# Patient Record
Sex: Male | Born: 1940 | Race: White | Hispanic: No | Marital: Married | Smoking: Former smoker
Health system: Southern US, Community
[De-identification: ages and names within clinical notes are randomized; demographics above are authoritative.]

## PROBLEM LIST (undated history)

## (undated) DIAGNOSIS — C719 Malignant neoplasm of brain, unspecified: Principal | ICD-10-CM

## (undated) DIAGNOSIS — E119 Type 2 diabetes mellitus without complications: Secondary | ICD-10-CM

## (undated) HISTORY — DX: Malignant neoplasm of brain, unspecified: C71.9

## (undated) HISTORY — PX: CHOLECYSTECTOMY: SHX55

## (undated) HISTORY — DX: Type 2 diabetes mellitus without complications: E11.9

---

## 2002-02-02 DIAGNOSIS — E7800 Pure hypercholesterolemia, unspecified: Secondary | ICD-10-CM | POA: Insufficient documentation

## 2006-06-19 DIAGNOSIS — N200 Calculus of kidney: Secondary | ICD-10-CM | POA: Insufficient documentation

## 2009-03-18 ENCOUNTER — Ambulatory Visit: Payer: Self-pay | Admitting: Cardiology

## 2009-04-04 DIAGNOSIS — I6782 Cerebral ischemia: Secondary | ICD-10-CM | POA: Insufficient documentation

## 2010-11-14 DIAGNOSIS — I517 Cardiomegaly: Secondary | ICD-10-CM | POA: Insufficient documentation

## 2015-08-24 DIAGNOSIS — I779 Disorder of arteries and arterioles, unspecified: Secondary | ICD-10-CM | POA: Insufficient documentation

## 2015-08-24 DIAGNOSIS — E119 Type 2 diabetes mellitus without complications: Secondary | ICD-10-CM | POA: Insufficient documentation

## 2015-08-24 DIAGNOSIS — I739 Peripheral vascular disease, unspecified: Secondary | ICD-10-CM

## 2015-08-27 DIAGNOSIS — R7301 Impaired fasting glucose: Secondary | ICD-10-CM | POA: Insufficient documentation

## 2015-09-14 DIAGNOSIS — B353 Tinea pedis: Secondary | ICD-10-CM | POA: Insufficient documentation

## 2015-09-14 DIAGNOSIS — B351 Tinea unguium: Secondary | ICD-10-CM | POA: Insufficient documentation

## 2015-10-19 DIAGNOSIS — I619 Nontraumatic intracerebral hemorrhage, unspecified: Secondary | ICD-10-CM | POA: Insufficient documentation

## 2015-10-19 DIAGNOSIS — N400 Enlarged prostate without lower urinary tract symptoms: Secondary | ICD-10-CM | POA: Insufficient documentation

## 2015-10-19 DIAGNOSIS — I10 Essential (primary) hypertension: Secondary | ICD-10-CM | POA: Insufficient documentation

## 2015-10-19 DIAGNOSIS — K219 Gastro-esophageal reflux disease without esophagitis: Secondary | ICD-10-CM | POA: Insufficient documentation

## 2015-10-19 DIAGNOSIS — D333 Benign neoplasm of cranial nerves: Secondary | ICD-10-CM | POA: Insufficient documentation

## 2015-10-19 DIAGNOSIS — E785 Hyperlipidemia, unspecified: Secondary | ICD-10-CM | POA: Insufficient documentation

## 2015-10-22 DIAGNOSIS — E119 Type 2 diabetes mellitus without complications: Secondary | ICD-10-CM | POA: Insufficient documentation

## 2015-10-27 DIAGNOSIS — C719 Malignant neoplasm of brain, unspecified: Secondary | ICD-10-CM | POA: Insufficient documentation

## 2015-10-27 HISTORY — DX: Malignant neoplasm of brain, unspecified: C71.9

## 2015-10-29 DIAGNOSIS — E559 Vitamin D deficiency, unspecified: Secondary | ICD-10-CM | POA: Insufficient documentation

## 2015-11-21 DIAGNOSIS — B370 Candidal stomatitis: Secondary | ICD-10-CM | POA: Insufficient documentation

## 2015-11-22 DIAGNOSIS — T380X5A Adverse effect of glucocorticoids and synthetic analogues, initial encounter: Secondary | ICD-10-CM | POA: Insufficient documentation

## 2015-11-22 DIAGNOSIS — G720 Drug-induced myopathy: Secondary | ICD-10-CM | POA: Insufficient documentation

## 2016-02-13 DIAGNOSIS — E162 Hypoglycemia, unspecified: Secondary | ICD-10-CM | POA: Insufficient documentation

## 2016-02-20 DIAGNOSIS — F19939 Other psychoactive substance use, unspecified with withdrawal, unspecified: Secondary | ICD-10-CM | POA: Insufficient documentation

## 2016-02-20 DIAGNOSIS — F19239 Other psychoactive substance dependence with withdrawal, unspecified: Secondary | ICD-10-CM | POA: Insufficient documentation

## 2016-03-19 DIAGNOSIS — D696 Thrombocytopenia, unspecified: Secondary | ICD-10-CM | POA: Insufficient documentation

## 2016-03-20 ENCOUNTER — Other Ambulatory Visit (HOSPITAL_COMMUNITY): Payer: Self-pay | Admitting: Oncology

## 2016-03-20 DIAGNOSIS — C719 Malignant neoplasm of brain, unspecified: Secondary | ICD-10-CM

## 2016-04-02 ENCOUNTER — Encounter (HOSPITAL_COMMUNITY): Payer: Medicare Other | Attending: Hematology | Admitting: Hematology

## 2016-04-02 ENCOUNTER — Encounter (HOSPITAL_COMMUNITY): Payer: Medicare Other

## 2016-04-02 ENCOUNTER — Encounter (HOSPITAL_COMMUNITY): Payer: Self-pay

## 2016-04-02 VITALS — BP 121/73 | HR 100 | Temp 98.1°F | Ht 67.0 in | Wt 179.1 lb

## 2016-04-02 DIAGNOSIS — Z87891 Personal history of nicotine dependence: Secondary | ICD-10-CM | POA: Diagnosis not present

## 2016-04-02 DIAGNOSIS — C719 Malignant neoplasm of brain, unspecified: Secondary | ICD-10-CM

## 2016-04-02 DIAGNOSIS — C713 Malignant neoplasm of parietal lobe: Secondary | ICD-10-CM | POA: Diagnosis present

## 2016-04-02 DIAGNOSIS — I10 Essential (primary) hypertension: Secondary | ICD-10-CM | POA: Diagnosis not present

## 2016-04-02 DIAGNOSIS — Z7984 Long term (current) use of oral hypoglycemic drugs: Secondary | ICD-10-CM | POA: Diagnosis not present

## 2016-04-02 DIAGNOSIS — D696 Thrombocytopenia, unspecified: Secondary | ICD-10-CM

## 2016-04-02 DIAGNOSIS — Z794 Long term (current) use of insulin: Secondary | ICD-10-CM | POA: Diagnosis not present

## 2016-04-02 DIAGNOSIS — Z79899 Other long term (current) drug therapy: Secondary | ICD-10-CM | POA: Diagnosis not present

## 2016-04-02 DIAGNOSIS — E139 Other specified diabetes mellitus without complications: Secondary | ICD-10-CM

## 2016-04-02 DIAGNOSIS — E1169 Type 2 diabetes mellitus with other specified complication: Secondary | ICD-10-CM | POA: Insufficient documentation

## 2016-04-02 DIAGNOSIS — E099 Drug or chemical induced diabetes mellitus without complications: Secondary | ICD-10-CM | POA: Diagnosis not present

## 2016-04-02 DIAGNOSIS — E785 Hyperlipidemia, unspecified: Secondary | ICD-10-CM | POA: Diagnosis not present

## 2016-04-02 DIAGNOSIS — M869 Osteomyelitis, unspecified: Secondary | ICD-10-CM

## 2016-04-02 LAB — CBC WITH DIFFERENTIAL/PLATELET
BASOS ABS: 0 10*3/uL (ref 0.0–0.1)
Basophils Relative: 0 %
Eosinophils Absolute: 0.1 10*3/uL (ref 0.0–0.7)
Eosinophils Relative: 1 %
HCT: 41.8 % (ref 39.0–52.0)
HEMOGLOBIN: 14.5 g/dL (ref 13.0–17.0)
LYMPHS ABS: 0.6 10*3/uL — AB (ref 0.7–4.0)
LYMPHS PCT: 9 %
MCH: 34.5 pg — ABNORMAL HIGH (ref 26.0–34.0)
MCHC: 34.7 g/dL (ref 30.0–36.0)
MCV: 99.5 fL (ref 78.0–100.0)
MONOS PCT: 4 %
Monocytes Absolute: 0.2 10*3/uL (ref 0.1–1.0)
NEUTROS PCT: 86 %
Neutro Abs: 5.3 10*3/uL (ref 1.7–7.7)
PLATELETS: 86 10*3/uL — AB (ref 150–400)
RBC: 4.2 MIL/uL — AB (ref 4.22–5.81)
RDW: 13.9 % (ref 11.5–15.5)
WBC: 6.2 10*3/uL (ref 4.0–10.5)

## 2016-04-02 LAB — COMPREHENSIVE METABOLIC PANEL
ALK PHOS: 40 U/L (ref 38–126)
ALT: 26 U/L (ref 17–63)
AST: 22 U/L (ref 15–41)
Albumin: 3.6 g/dL (ref 3.5–5.0)
Anion gap: 11 (ref 5–15)
BUN: 18 mg/dL (ref 6–20)
CHLORIDE: 99 mmol/L — AB (ref 101–111)
CO2: 25 mmol/L (ref 22–32)
CREATININE: 0.87 mg/dL (ref 0.61–1.24)
Calcium: 8.9 mg/dL (ref 8.9–10.3)
GFR calc Af Amer: >60 mL/min (ref >60)
Glucose, Bld: 316 mg/dL — ABNORMAL HIGH (ref 65–99)
Potassium: 4.9 mmol/L (ref 3.5–5.1)
Sodium: 135 mmol/L (ref 135–145)
Total Bilirubin: 0.7 mg/dL (ref 0.3–1.2)
Total Protein: 5.8 g/dL — ABNORMAL LOW (ref 6.5–8.1)

## 2016-04-02 MED ORDER — DEXAMETHASONE 4 MG PO TABS
ORAL_TABLET | ORAL | Status: DC
Start: 2016-04-02 — End: 2016-07-19

## 2016-04-02 NOTE — Progress Notes (Signed)
Marland Kitchen    HEMATOLOGY/ONCOLOGY CONSULTATION NOTE  Date of Service: 04/02/2016  Patient Care Team: [name], MD as PCP - General (Internal Medicine)  CHIEF COMPLAINTS/PURPOSE OF CONSULTATION:   Continued treatment of GBM.  HISTORY OF PRESENTING ILLNESS:  Phillip Mckay is a wonderful 75 y.o. male who has been referred to [place] by Dr Neijstrom/Dr Sherrie Sport for evaluation and management of GBM.  He is 75 year old gentleman who presented with  trouble utilizing his left hand as well as parking his vehicle within the designated parking space was the end of December 2016.   He was evaluated and an MRI of the brain on 10/19/2015 which showed a 4.4 x 3.8 x 4.6 cm peripheral enhancing mass centered within the right parietal lobe consistent with a primary glial neoplasm with edema and some mass effect and some associated hemorrhage. He also had a 3 mm midline shift.  Patient subsequently had a subtotal resection of about 80% of the tumor mass by Dr. Owens Shark on 10/25/2015 at [facility].  He was apparently discharged on dexamethasone on 4 mg 4 times a day as well as Keppra 750 mg twice a day.  It was uncertain if he had a seizure prior to his surgery ordered the Keppra was started for seizure prophylaxis.  He was subsequently started on concurrent radiation therapy plus temozolomide (75 mg meter squared = 140 mg daily while on radiation) chemotherapy. Dr. Ceasar Lund his radiation oncologist. He was on Bactrim DS MWF for PCP/PJP prophylaxis.  Patient was noted to have thrush which was treated during his treatment. 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  He was noted to have thrombocytopenia  which was low enough to delay his start of adjuvant temozolomide chemotherapy after completion of his chemoradiation.  He was started on his first cycle of adjuvant temozolomide at 150 mg/m (280 mg daily from D1 to D5) On 02/21/2016.  He was due for his second cycle on 03/19/2016.  His platelet counts were down to 80k and his temozolomide was held. Dose escalation to 200 mg/m was not done and he was asked to wait to restrict his improvement prior to proceeding with his second cycle of temozolomide.  Tylenol was changed to tramadol.  Patient transferred care to [facility] all repeat labs today show his platelet counts are at 84k He has currently been Dexamethasone 4 mg alternating with 8mg  every other day. We reduced his dose to 4 mg by mouth daily this visit. He has been on ranitidine which we asked him to change since this can cause thrombocytopenia. He was also told to stop vitamin B complex.  He has also been using alternating electrical current treatment  directed by Dr. Lisbeth Renshaw.  No current throat soreness .no fevers or chills. No night sweats. No new focal neurological deficits.  No headaches.        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 In his wife have 2 sons and they adopted one daughter [name] is with him today.  #4 history of hypertension #5 history of dyslipidemia  SURGICAL HISTORY:  10/25/2015 Surgery  Subtotal resection Dr. Owens Shark   SOCIAL HISTORY: Social History   Social History  . Marital Status: Married    Spouse Name: N/A  . Number of Children: N/A  . Years of Education: N/A   Occupational History  . Not on file.   Social History Main Topics  . Smoking status: Not on file  . Smokeless tobacco: Not on file  . Alcohol Use: Not on file  . Drug Use: Not on file  .  Sexual Activity: Not on file   Other Topics Concern  . Not on file   Social History Narrative  . No narrative on file  former smoker quit at age 68 yo Worked as a Pharmacist, community for about 40 years.  FAMILY HISTORY: No family history on file.  ALLERGIES:  has no allergies on file.  MEDICATIONS:  Current Outpatient Prescriptions  Medication Sig Dispense Refill  . B-D ULTRA-FINE 33 LANCETS MISC by Does not apply route.    Mariane Baumgarten Sodium 100 MG capsule Take by mouth.    Marland Kitchen glucose blood (FREESTYLE INSULINX TEST) test strip     . Glycerin-Hypromellose-PEG 400 0.2-0.2-1 % SOLN Apply to eye.    . insulin aspart (NOVOLOG) 100 UNIT/ML injection Inject into the skin.    Marland Kitchen latanoprost (XALATAN) 0.005 % ophthalmic solution Apply to eye.    . levETIRAcetam (KEPPRA) 750 MG tablet Take by mouth.    Marland Kitchen lisinopril (PRINIVIL,ZESTRIL) 40 MG tablet     . ondansetron (ZOFRAN) 8 MG tablet Take by mouth.    . Probiotic Product [facility]) CAPS Take by mouth.    . tamsulosin (FLOMAX) 0.4 MG CAPS capsule Take by mouth.    . tetrahydrozoline 0.05 % ophthalmic solution     . Vitamin D, Ergocalciferol, (DRISDOL) 50000 units CAPS capsule Take by mouth.    . dexamethasone (DECADRON) 4 MG tablet Reduce to 4mg  po daily x 7 days then 4mg  alternating with 2mg  every other day x 10 days then 2mg  po daily till next appointment 60 tablet 0  . metFORMIN (GLUCOPHAGE) 1000 MG tablet Take by mouth.     No current facility-administered medications for this visit.    REVIEW OF SYSTEMS:    10 Point review of Systems was done is negative except as noted above.  PHYSICAL EXAMINATION: ECOG PERFORMANCE STATUS: 2 - Symptomatic, <50% confined to bed  . Filed Vitals:   04/02/16 1500  BP: 121/73  Pulse: 100  Temp: 98.1 F (36.7 C)   Filed Weights   04/02/16 1500  Weight: 179 lb 1.6 oz (81.239 kg)   .Body mass index is 28.04 kg/(m^2).  GENERAL:alert, in no acute distress and comfortable, has wires from the alternating  electrical currents machine on his head SKIN: skin color, texture, turgor are normal, no rashes or significant lesions EYES: normal, conjunctiva are pink and non-injected, sclera clear OROPHARYNX:no exudate, no erythema and lips, buccal mucosa, and tongue normal  NECK: supple, no JVD, thyroid normal size, non-tender, without nodularity LYMPH:  no palpable lymphadenopathy in the cervical, axillary or inguinal LUNGS: clear to auscultation with normal respiratory effort HEART: regular rate & rhythm,  no murmurs and no lower extremity edema ABDOMEN: abdomen soft, non-tender, normoactive bowel sounds  Musculoskeletal: no cyanosis of digits and no clubbing  PSYCH: alert & oriented x 3 with fluent speech NEURO: Moving all 4 extremities, some clumsiness in his left upper extremity  LABORATORY DATA:  I have reviewed the data as listed  . CBC Latest Ref  Rng 04/02/2016  WBC 4.0 - 10.5 K/uL 6.2  Hemoglobin 13.0 - 17.0 g/dL 14.5  Hematocrit 39.0 - 52.0 % 41.8  Platelets 150 - 400 K/uL 86(L)   . CMP Latest Ref Rng 04/02/2016  Glucose 65 - 99 mg/dL 316(H)  BUN 6 - 20 mg/dL 18  Creatinine 0.61 - 1.24 mg/dL 0.87  Sodium 135 - 145 mmol/L 135  Potassium 3.5 - 5.1 mmol/L 4.9  Chloride 101 - 111 mmol/L 99(L)  CO2 22 - 32 mmol/L 25  Calcium 8.9 - 10.3 mg/dL 8.9  Total Protein 6.5 - 8.1 g/dL 5.8(L)  Total Bilirubin 0.3 - 1.2 mg/dL 0.7  Alkaline Phos 38 - 126 U/L 40  AST 15 - 41 U/L 22  ALT 17 - 63 U/L 26    Final Diagnosis  Brain, right parietal, biopsies (specimens 1-2): - Glioblastoma, WHO grade 4; see comment.  Electronically signed by Golden Pop, MD on 10/26/2015 at [place]: I have personally reviewed the radiological images as listed and agreed with the findings in the report. No results found. MRI IMPRESSION: 4.4 x 3.8 x 4.6 CM peripherally enhancing mass centered within the right parietal lobe which is favored to be a primary glial neoplasm. Though this also  may reflect a metastatic lesion, this is considered less likely.  Areas of surrounding susceptibility and layering susceptibility the dependent portion reflective of associated hemorrhage. Surrounding T2/FLAIR hyperintense signal reflective of edema with mass effect and slight effacement of the sulci and right  ventricle and up to 3 mm of midline shift. Areas of diffusion restriction medial to the mass, reflective of possibly related to ischemia versus lesion cellularity.   ASSESSMENT & PLAN:   75 year old pleasant Caucasian gentleman with  #1 Right parietal lobe GBM/WHO grade 4. Unknown methylation status off MGMT. Patient has had partial resection 80% of the tumor on 10/25/2015. Completed concurrent temozolomide and radiation therapy. Received his first cycle of adjuvant temozolomide on 02/21/2016. Patient was due for his second cycle of temozolomide on 03/19/2016 but was postponed due to thrombocytopenia with platelets of 80k Plan -Patient is here for his initial evaluation for continued management of his GBM. -Given his thrombocytopenia his dose for second cycle of temozolomide was maintained at 150mg /m2 -Unfortunately his platelet count is still low at [facility] and therefore his temozolomide not started yet.  - tapering down his dexamethasone. Reduce to 4mg  po daily x 7 days then 4mg  alternating with 2mg  every other day x 10 days then 2mg  po daily till next appointment -We will need to be reevaluated 2 weeks with repeat labs and an MRI of the brain with and without contrast -Has follow-up with Dr. Lisbeth Renshaw on 04/19/2016. He has been managing his alternating electrical currents treatment and is also following him up for post radiation follow-up.  #2 Thrombocytopenia - likely medication related . No issues with bleeding. Gradually improving. No overt clumping noted on his CBC.  Could be due to prolonged cytopenia related to temozolomide .  Previous and Bactrim which could also be a factor  . Plan -Recommended he change his ranitidine to Tums when necessary or PPI . -No over-the-counter NSAIDs . -Keppra could certainly be a factor as well and if the seizures are controlled we might need to consider backing off on this gradually  Potentially switching to Neurontin.  -Empiric use of over-the-counter B complex -Recheck counts in 2 weeks    #3 history of steroid-induced diabetes  -Hopefully this will improve as his  steroids are tapered . Continue his current medications . -Counseled on low glycemic index foods . #4 history of hypertension   #5 history of dyslipidemia - will hold off on statins given myopathy .   return to care with Dr. Whitney Muse in 2 weeks with repeat labs and MRI of the brain.  All of the patients questions were answered with apparent satisfaction. The patient knows to call the clinic with any problems, questions or concerns.  I spent 60 minutes counseling the patient face to face. The total time spent in the appointment was 60 minutes and more than 50% was on counseling and direct patient cares.    Sullivan Lone MD [place] AAHIVMS [facility] Hematology/Oncology Physician [facility]  (Office):       [phone] (Work cell):  [phone] (Fax):           [phone]  04/02/2016 3:23 PM

## 2016-04-02 NOTE — Patient Instructions (Signed)
[place] at [facility] Discharge Instructions  RECOMMENDATIONS MADE BY THE CONSULTANT AND ANY TEST RESULTS WILL BE SENT TO YOUR REFERRING PHYSICIAN.  [place] at [facility] Discharge Instructions  RECOMMENDATIONS MADE BY THE CONSULTANT AND ANY TEST RESULTS WILL BE SENT TO YOUR REFERRING PHYSICIAN.  MRI and Labs scheduled on July 3rd Appointment with Dr. Whitney Muse on July 7th Call clinic with any concerns.  Thank you for choosing [place] at [facility] to provide your oncology and hematology care.  To afford each patient quality time with our provider, please arrive at least 15 minutes before your scheduled appointment time.   Beginning January 23rd 2017 lab work for the [facility] will be done in the  Main lab at [facility] on 1st floor. If you have a lab appointment with the [place] please come in thru the  Main Entrance and check in at the main information desk  You need to re-schedule your appointment should you arrive 10 or more minutes late.  We strive to give you quality time with our providers, and arriving late affects you and other patients whose appointments are after yours.  Also, if you no show three or more times for appointments you may be dismissed from the clinic at the providers discretion.     Again, thank you for choosing [facility].  Our hope is that these requests will decrease the amount of time that you wait before being seen by our physicians.       _____________________________________________________________  Should you have questions after your visit to [facility], please contact our office at [phone] between the hours of 8:30 a.m. and 4:30 p.m.  Voicemails left after 4:30 p.m. will not be returned until the following business day.  For prescription refill requests, have your pharmacy contact our office.         Resources For Cancer  Patients and their Caregivers ? American Cancer Society: Can assist with transportation, wigs, general needs, runs Look Good Feel Better.        586-492-8287 ? Cancer Care: Provides financial assistance, online support groups, medication/co-pay assistance.  1-800-813-HOPE 503 093 0081) ? [place] Assists [place] cancer patients and their families through emotional , educational and financial support.  8081524525 ? [place] DSS Where to apply for food stamps, Medicaid and utility assistance. 339-782-9352 ? RCATS: Transportation to medical appointments. 364-547-8905 ? Social Security Administration: May apply for disability if have a Stage IV cancer. 918-818-4394 6185310497 ? [employer], Disability and Transit Services: Assists with nutrition, care and transit needs. [place] Support Programs: @10RELATIVEDAYS @ > Cancer Support Group  2nd Tuesday of the month 1pm-2pm, Journey Room  > Creative Journey  3rd Tuesday of the month 1130am-1pm, Journey Room  > Look Good Feel Better  1st Wednesday of the month 10am-12 noon, Journey Room (Call [place] to register [phone])    Thank you for choosing [place] at [facility] to provide your oncology and hematology care.  To afford each patient quality time with our provider, please arrive at least 15 minutes before your scheduled appointment time.   Beginning January 23rd 2017 lab work for the [facility] will be done in the  Main lab at [facility] on 1st floor. If you have a lab appointment with the [facility] please come in thru the  Main Entrance  and check in at the main information desk  You need to re-schedule your appointment should you arrive 10 or more minutes late.  We strive to give you quality time with our providers, and arriving late affects you and other patients whose appointments are after yours.  Also, if you no  show three or more times for appointments you may be dismissed from the clinic at the providers discretion.     Again, thank you for choosing [facility].  Our hope is that these requests will decrease the amount of time that you wait before being seen by our physicians.       _____________________________________________________________  Should you have questions after your visit to [facility], please contact our office at [phone] between the hours of 8:30 a.m. and 4:30 p.m.  Voicemails left after 4:30 p.m. will not be returned until the following business day.  For prescription refill requests, have your pharmacy contact our office.         Resources For Cancer Patients and their Caregivers ? American Cancer Society: Can assist with transportation, wigs, general needs, runs Look Good Feel Better.        314-427-2088 ? Cancer Care: Provides financial assistance, online support groups, medication/co-pay assistance.  1-800-813-HOPE 223-163-1444) ? [place] Assists [place] cancer patients and their families through emotional , educational and financial support.  832-226-2223 ? [place] DSS Where to apply for food stamps, Medicaid and utility assistance. 6044758068 ? RCATS: Transportation to medical appointments. (361)873-5493 ? Social Security Administration: May apply for disability if have a Stage IV cancer. (503) 012-3788 (331) 320-0747 ? [employer], Disability and Transit Services: Assists with nutrition, care and transit needs. [place] Support Programs: @10RELATIVEDAYS @ > Cancer Support Group  2nd Tuesday of the month 1pm-2pm, Journey Room  > Creative Journey  3rd Tuesday of the month 1130am-1pm, Journey Room  > Look Good Feel Better  1st Wednesday of the month 10am-12 noon, Journey Room (Call [place] to register [phone])

## 2016-04-16 ENCOUNTER — Other Ambulatory Visit: Payer: Self-pay | Admitting: Hematology

## 2016-04-16 ENCOUNTER — Other Ambulatory Visit (HOSPITAL_COMMUNITY): Payer: Self-pay | Admitting: *Deleted

## 2016-04-16 ENCOUNTER — Other Ambulatory Visit (HOSPITAL_COMMUNITY): Payer: Self-pay | Admitting: Oncology

## 2016-04-16 ENCOUNTER — Encounter (HOSPITAL_COMMUNITY): Payer: Medicare Other | Attending: Hematology

## 2016-04-16 ENCOUNTER — Ambulatory Visit (HOSPITAL_COMMUNITY)
Admission: RE | Admit: 2016-04-16 | Discharge: 2016-04-16 | Disposition: A | Discharge status: Home / Self Care | Payer: Medicare Other | Source: Ambulatory Visit | Attending: Hematology | Admitting: Hematology

## 2016-04-16 DIAGNOSIS — E099 Drug or chemical induced diabetes mellitus without complications: Secondary | ICD-10-CM | POA: Insufficient documentation

## 2016-04-16 DIAGNOSIS — R519 Headache, unspecified: Secondary | ICD-10-CM

## 2016-04-16 DIAGNOSIS — R9082 White matter disease, unspecified: Secondary | ICD-10-CM | POA: Diagnosis not present

## 2016-04-16 DIAGNOSIS — Z79899 Other long term (current) drug therapy: Secondary | ICD-10-CM | POA: Diagnosis not present

## 2016-04-16 DIAGNOSIS — E785 Hyperlipidemia, unspecified: Secondary | ICD-10-CM | POA: Insufficient documentation

## 2016-04-16 DIAGNOSIS — D696 Thrombocytopenia, unspecified: Secondary | ICD-10-CM | POA: Diagnosis not present

## 2016-04-16 DIAGNOSIS — E876 Hypokalemia: Secondary | ICD-10-CM | POA: Insufficient documentation

## 2016-04-16 DIAGNOSIS — I10 Essential (primary) hypertension: Secondary | ICD-10-CM | POA: Diagnosis not present

## 2016-04-16 DIAGNOSIS — Z9889 Other specified postprocedural states: Secondary | ICD-10-CM | POA: Diagnosis not present

## 2016-04-16 DIAGNOSIS — C719 Malignant neoplasm of brain, unspecified: Secondary | ICD-10-CM | POA: Diagnosis present

## 2016-04-16 DIAGNOSIS — Z794 Long term (current) use of insulin: Secondary | ICD-10-CM | POA: Insufficient documentation

## 2016-04-16 DIAGNOSIS — Z87891 Personal history of nicotine dependence: Secondary | ICD-10-CM | POA: Insufficient documentation

## 2016-04-16 DIAGNOSIS — R51 Headache: Principal | ICD-10-CM

## 2016-04-16 DIAGNOSIS — Z7984 Long term (current) use of oral hypoglycemic drugs: Secondary | ICD-10-CM | POA: Insufficient documentation

## 2016-04-16 LAB — CBC WITH DIFFERENTIAL/PLATELET
BASOS PCT: 0 %
Basophils Absolute: 0 10*3/uL (ref 0.0–0.1)
Eosinophils Absolute: 0 10*3/uL (ref 0.0–0.7)
Eosinophils Relative: 0 %
HEMATOCRIT: 40.2 % (ref 39.0–52.0)
HEMOGLOBIN: 13.8 g/dL (ref 13.0–17.0)
LYMPHS ABS: 0.7 10*3/uL (ref 0.7–4.0)
Lymphocytes Relative: 12 %
MCH: 33.7 pg (ref 26.0–34.0)
MCHC: 34.3 g/dL (ref 30.0–36.0)
MCV: 98 fL (ref 78.0–100.0)
MONO ABS: 0.5 10*3/uL (ref 0.1–1.0)
MONOS PCT: 8 %
NEUTROS ABS: 4.6 10*3/uL (ref 1.7–7.7)
Neutrophils Relative %: 80 %
Platelets: 80 10*3/uL — ABNORMAL LOW (ref 150–400)
RBC: 4.1 MIL/uL — ABNORMAL LOW (ref 4.22–5.81)
RDW: 13.9 % (ref 11.5–15.5)
WBC: 5.8 10*3/uL (ref 4.0–10.5)

## 2016-04-16 LAB — COMPREHENSIVE METABOLIC PANEL
ALBUMIN: 3.8 g/dL (ref 3.5–5.0)
ALK PHOS: 40 U/L (ref 38–126)
ALT: 24 U/L (ref 17–63)
ANION GAP: 9 (ref 5–15)
AST: 17 U/L (ref 15–41)
BILIRUBIN TOTAL: 0.8 mg/dL (ref 0.3–1.2)
BUN: 12 mg/dL (ref 6–20)
CALCIUM: 9.4 mg/dL (ref 8.9–10.3)
CO2: 27 mmol/L (ref 22–32)
Chloride: 103 mmol/L (ref 101–111)
Creatinine, Ser: 0.65 mg/dL (ref 0.61–1.24)
Glucose, Bld: 169 mg/dL — ABNORMAL HIGH (ref 65–99)
POTASSIUM: 3.4 mmol/L — AB (ref 3.5–5.1)
Sodium: 139 mmol/L (ref 135–145)
TOTAL PROTEIN: 6.4 g/dL — AB (ref 6.5–8.1)

## 2016-04-16 MED ORDER — POTASSIUM CHLORIDE CRYS ER 20 MEQ PO TBCR: 20.0000 meq | EXTENDED_RELEASE_TABLET | Freq: Every day | ORAL | Status: DC

## 2016-04-16 MED ORDER — GADOBENATE DIMEGLUMINE 529 MG/ML IV SOLN
15.0000 mL | Freq: Once | INTRAVENOUS | Status: AC | PRN
  Administered 2016-04-16: 15 mL via INTRAVENOUS

## 2016-04-20 ENCOUNTER — Encounter (HOSPITAL_BASED_OUTPATIENT_CLINIC_OR_DEPARTMENT_OTHER): Payer: Medicare Other | Admitting: Hematology & Oncology

## 2016-04-20 ENCOUNTER — Other Ambulatory Visit (HOSPITAL_COMMUNITY): Payer: Medicare Other

## 2016-04-20 ENCOUNTER — Encounter (HOSPITAL_COMMUNITY): Payer: Self-pay | Admitting: Hematology & Oncology

## 2016-04-20 VITALS — BP 127/70 | HR 81 | Temp 98.1°F | Resp 18 | Wt 179.7 lb

## 2016-04-20 DIAGNOSIS — D696 Thrombocytopenia, unspecified: Secondary | ICD-10-CM | POA: Diagnosis not present

## 2016-04-20 DIAGNOSIS — C713 Malignant neoplasm of parietal lobe: Secondary | ICD-10-CM

## 2016-04-20 DIAGNOSIS — C719 Malignant neoplasm of brain, unspecified: Secondary | ICD-10-CM

## 2016-04-20 MED ORDER — LEVETIRACETAM 500 MG PO TABS: 500.0000 mg | ORAL_TABLET | Freq: Two times a day (BID) | ORAL | Status: DC

## 2016-04-20 MED ORDER — LORAZEPAM 1 MG PO TABS: ORAL_TABLET | ORAL | Status: DC

## 2016-04-20 NOTE — Progress Notes (Signed)
Phillip Mckay Kitchen    HEMATOLOGY/ONCOLOGY PROGRESS NOTE  Date of Service: 04/20/2016  Patient Care Team: Phillip Burly, MD as PCP - General (Internal Medicine)  CHIEF COMPLAINTS/PURPOSE OF CONSULTATION:  Continued treatment of GBM.  HISTORY OF PRESENTING ILLNESS:  Phillip Mckay is a wonderful 75 y.o. male who has been referred to [place] by Dr Phillip Mckay/Dr Phillip Mckay for evaluation and management of GBM.  He is 75 year old gentleman who presented with  trouble utilizing his left hand as well as parking his vehicle within the designated parking space was the end of December 2016.   He was evaluated and an MRI of the brain on 10/19/2015 which showed a 4.4 x 3.8 x 4.6 cm peripheral enhancing mass centered within the right parietal lobe consistent with a primary glial neoplasm with edema and some mass effect and some associated hemorrhage. He also had a 3 mm midline shift.  Patient subsequently had a subtotal resection of about 80% of the tumor mass by Phillip Mckay on 10/25/2015 at [facility].  He was apparently discharged on dexamethasone on 4 mg 4 times a day as well as Keppra 750 mg twice a day.  It was uncertain if he had a seizure prior to his surgery ordered the Keppra was started for seizure prophylaxis.  He was subsequently started on concurrent radiation therapy plus temozolomide (75 mg meter squared = 140 mg daily while on radiation) chemotherapy. Dr. Lisbeth Mckay is his radiation oncologist. He was on Bactrim DS MWF for PCP prophylaxis.  Patient was noted to have thrush which was treated during his treatment. 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  He was noted to have thrombocytopenia which was low  enough to delay his start of adjuvant temozolomide chemotherapy after completion of his chemoradiation.  He was started on his first cycle of adjuvant temozolomide at 150 mg/m (280 mg daily from D1 to D5) On 02/21/2016.  Phillip Mckay returns to the [place] today accompanied by several family members: his wife, son, and daughter. He is in a wheelchair and outfitted with devices surrounding the crown of his head. He refers to it as "the hat." He notes that, the more he wears it, the more it works; or so he was told. The longer you wear the "hat" or the "dreadlocks," as his son calls it, the more effective it is.  Dr. Lisbeth Mckay is doing his radiation. His original radiation was done over at [facility]  When asked how he's done with his Temodar through the radiation, he confirms that he was able to keep taking them. He went through one cycle before Phillip Mckay held the next due to his platelet count.  Discussing his mental sharpness, he says "I'm just like a tack."  He confirms that he's able to dress himself every day, but he does very little day to day. He says he isn't too tired, he just doesn't have a lot that he can do, "I don't think." He says he doesn't have a good balance, so he depends on the walker, or he falls forward. He notes "if I don't be real careful I'll tumble over on my head." When asked about mood changes, he says "I wouldn't say that there's been a really big change." His son says he  has ups and downs, but he's still trying to play around with everybody.  Phillip Mckay remarks that some days he wants to sleep a lot. He'd like to be able to get out there and do what he used to do, which bothers him because he can't do it. He feels like he does better when he takes the "little blue pills," and his daughter notes that those are the steroids. When he was taken off of those, he felt worse and had to go to the hospital. He confirms that they are trying to taper him off of the steroids  slowly.  When asked about his seizures, he says that when they found his brain tumor, he could be sitting in the chair and his hand would just droop off the arm rest. Initially, they thought he was having strokes or seizures, but discovered the brain tumor instead. He says he's had two brain operations in 22 years. 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 He says he doesn't recall "falling out" or nothing like that. Family does not believe he every had seizures.   He notes that he thinks he sleeps pretty well, other than getting up to go to the bathroom.  He is on 750 keppra twice a day. His family confirms that he's done with bactrim, finished maybe a week or two after the radiation.  He is able to ambulate to the exam table and step up onto it with significant assistance, though he has some difficulty stepping up and pulling his body weight up.  During the physical exam, he notes that his bowels have been good, and that he has a movement once a day. He notes having constipation for a couple of weeks but "took care of that." When asked if he has pain, he says he doesn't. Regarding his strength, he notes "I've got more strength in my arms and hands than I've got anywhere. ROM and strength tests were performed on his arms and his legs, grip test with his hands. He is possibly weaker on the L side, after strength tests were performed on his arms.   He had physical therapy for a while, but now refuses to do it (according to his family members). 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 In his wife have 2 sons and they adopted one daughter Phillip Mckay is with him today.  #4 history of hypertension #5 history of dyslipidemia  SURGICAL  HISTORY:   10/25/2015 Surgery  Subtotal resection Phillip Mckay   SOCIAL HISTORY: Social History   Social History  . Marital Status: Married    Spouse Name: N/A  . Number of Children: N/A  . Years of Education: N/A   Occupational History  . Not on file.   Social History Main Topics  . Smoking status: Former Research scientist (life sciences)  . Smokeless tobacco: Not on file  . Alcohol Use: No  . Drug Use: No  . Sexual Activity: No   Other Topics Concern  . Not on file   Social History Narrative  former smoker quit at age 82 yo Worked as a Pharmacist, community for about 40 years.  Married 54 years, 59 in September 2017 2 sons and 1 daughter  He notes that he did a lot of driving and "heavy work" in his career. He also used to get up every morning at 5 in the morning and walk 3 miles, every day.  FAMILY HISTORY: Family History  Problem Relation Age of Onset  . Cancer Mother   . Diabetes Mother   . Cancer Father   . Cancer Brother     ALLERGIES:  has No Known Allergies.  MEDICATIONS:  Current Outpatient Prescriptions  Medication Sig Dispense Refill  . b complex vitamins tablet Take 1 tablet by mouth daily.    . B-D UF III MINI PEN NEEDLES 31G X 5 MM MISC     . B-D ULTRA-FINE 33 LANCETS MISC by Does not apply route.    Phillip Mckay Kitchen dexamethasone (DECADRON) 4 MG tablet Reduce to 4mg  po daily x 7 days then 4mg  alternating with 2mg  every other day x 10 days then 2mg  po daily till next appointment 60 tablet 0  . Docusate Sodium 100 MG capsule Take by mouth.    Phillip Mckay Kitchen glucose blood (FREESTYLE INSULINX TEST) test strip     . insulin aspart (NOVOLOG) 100 UNIT/ML injection Inject into the skin.    Phillip Mckay Kitchen lisinopril (PRINIVIL,ZESTRIL) 40 MG tablet     . metFORMIN (GLUCOPHAGE) 1000 MG tablet Take by mouth.    Phillip Mckay Kitchen omeprazole (PRILOSEC) 20 MG capsule Take 20 mg by mouth daily.    . ondansetron (ZOFRAN) 8 MG tablet Take by mouth.    . potassium chloride SA (K-DUR,KLOR-CON) 20 MEQ tablet Take 1 tablet (20 mEq total) by mouth daily. 30  tablet 1  . Probiotic Product [facility]) CAPS Take by mouth.    . tamsulosin (FLOMAX) 0.4 MG CAPS capsule Take by mouth.    . traMADol (ULTRAM) 50 MG tablet     . travoprost, benzalkonium, (TRAVATAN) 0.004 % ophthalmic solution 1 drop at bedtime.    . Vitamin D, Ergocalciferol, (DRISDOL) 50000 units CAPS capsule Take by mouth.    . [DISCONTINUED] levETIRAcetam (KEPPRA) 750 MG tablet Take by mouth. Reported on 04/20/2016    . fluconazole (DIFLUCAN) 100 MG tablet Reported on 04/20/2016    . gabapentin (NEURONTIN) 300 MG capsule Reported on 04/20/2016    . Glycerin-Hypromellose-PEG 400 0.2-0.2-1 % SOLN Apply to eye. Reported on 04/20/2016    . latanoprost (XALATAN) 0.005 % ophthalmic solution Apply to eye. Reported on 04/20/2016    . prochlorperazine (COMPAZINE) 10 MG tablet Reported on 04/20/2016    . simvastatin (ZOCOR) 20 MG tablet Reported on 04/20/2016    . tetrahydrozoline 0.05 % ophthalmic solution Reported on 04/20/2016     No current facility-administered medications for this visit.    REVIEW OF SYSTEMS:   14 point review of systems was performed and is negative except as detailed under history of present illness and above    PHYSICAL EXAMINATION: ECOG PERFORMANCE STATUS: 2 - Symptomatic, <50% confined to bed  . Filed Vitals:   04/20/16 0821  BP: 127/70  Pulse: 81  Temp: 98.1 F (36.7 C)  Resp: 18   Filed Weights   04/20/16 0821  Weight: 179 lb 11.2 oz (81.511 kg)   .Body mass index is 28.14 kg/(m^2).  GENERAL:alert, in no acute distress and comfortable, has wires from the alternating electrical currents machine on his head SKIN: skin color, texture, turgor are normal, no rashes or significant lesions EYES: normal, conjunctiva are pink and non-injected, sclera clear OROPHARYNX:no exudate, no erythema and lips, buccal mucosa, and tongue normal  NECK: supple, no JVD,  thyroid normal size, non-tender, without nodularity LYMPH:  no palpable lymphadenopathy in the cervical, axillary or  inguinal LUNGS: clear to auscultation with normal respiratory effort HEART: regular rate & rhythm,  no murmurs and no lower extremity edema ABDOMEN: abdomen soft, non-tender, normoactive bowel sounds  Musculoskeletal: no cyanosis of digits and no clubbing  PSYCH: alert & oriented x 3 with fluent speech NEURO: Moving all 4 extremities, some clumsiness in his left upper extremity  LABORATORY DATA:  I have reviewed the data as listed  . CBC Latest Ref Rng 04/16/2016 04/02/2016  WBC 4.0 - 10.5 K/uL 5.8 6.2  Hemoglobin 13.0 - 17.0 g/dL 13.8 14.5  Hematocrit 39.0 - 52.0 % 40.2 41.8  Platelets 150 - 400 K/uL 80(L) 86(L)   . CMP Latest Ref Rng 04/16/2016 04/02/2016  Glucose 65 - 99 mg/dL 169(H) 316(H)  BUN 6 - 20 mg/dL 12 18  Creatinine 0.61 - 1.24 mg/dL 0.65 0.87  Sodium 135 - 145 mmol/L 139 135  Potassium 3.5 - 5.1 mmol/L 3.4(L) 4.9  Chloride 101 - 111 mmol/L 103 99(L)  CO2 22 - 32 mmol/L 27 25  Calcium 8.9 - 10.3 mg/dL 9.4 8.9  Total Protein 6.5 - 8.1 g/dL 6.4(L) 5.8(L)  Total Bilirubin 0.3 - 1.2 mg/dL 0.8 0.7  Alkaline Phos 38 - 126 U/L 40 40  AST 15 - 41 U/L 17 22  ALT 17 - 63 U/L 24 26     Results Final Diagnosis  Brain, right parietal, biopsies (specimens 1-2): - Glioblastoma, WHO grade 4; see comment.  Electronically signed by Golden Pop, MD on 10/26/2015 at [place]: I have personally reviewed the radiological images as listed and agreed with the findings in the report. Dg Skull 1-3 Views  04/16/2016  CLINICAL DATA:  75 year old male with frequent headaches, undergoing MRI clearance. Previous craniotomy and craniectomy, personal history of glioblastoma up the right hemisphere treated with resection earlier this year. And also previous left occipital craniectomy. Initial encounter. EXAM: SKULL - 1-3 VIEW COMPARISON:  [facility] Head CT 02/04/2016, brain MRI 11/18/2015, and earlier. FINDINGS: Left occipital/ suboccipital craniectomy  changes with malleable plate and screws and right side parietal craniotomy defect re- demonstrated. These postoperative changes appear stable since the February MRI this year and should not preclude a repeat MRI. Absent dentition. No metallic foreign bodies identified about the orbits. IMPRESSION: Stable postoperative changes since February. No contraindication to repeat MRI. Electronically Signed   By: Genevie Ann M.D.   On: 04/16/2016 11:15   Mr [name] [id number] Contrast  04/16/2016  CLINICAL DATA:  Right parietal glioblastoma status post subtotal resection on 10/25/2015 followed by radiation therapy and Temodar. EXAM: MRI HEAD WITHOUT AND WITH CONTRAST TECHNIQUE: Multiplanar, multiecho pulse sequences of the brain and surrounding structures were obtained without and with intravenous contrast. CONTRAST:  49mL MULTIHANCE GADOBENATE DIMEGLUMINE 529 MG/ML IV SOLN COMPARISON:  11/18/2015 FINDINGS: Sequelae of right parietal craniotomy and tumor resection are again identified. Chronic blood products are again seen in the right parietal resection cavity which has decreased in size, currently measuring 3.2 cm (previously 4.0 cm). T2 hyperintensity immediately adjacent to the resection cavity, specifically medially and superiorly, has decreased. Enhancement along the margins of the resection cavity on the prior study has largely resolved, with only minimal residual enhancement noted anteriorly. No abnormal enhancement is identified distant to the resection site. Confluent T2 hyperintensity more anteriorly involving the centrum semiovale, corona radiata, and external capsule on the right has  increased from the prior study without associated mass effect. Similar but slightly less prominent changes are present at these same locations in the left cerebral hemisphere, also increased from prior. There is no evidence of acute infarct, midline shift, or extra-axial fluid collection. Sequelae of prior left occipital craniectomy are again  identified with unchanged underlying left cerebellar encephalomalacia. There is mild cerebral atrophy. Orbits are unremarkable. Paranasal sinuses and mastoid air cells are clear. Major intracranial vascular flow voids are preserved. IMPRESSION: 1. Overall improved appearance of the right parietal resection site with largely resolved enhancement and decreased immediately surrounding T2 signal abnormality. 2. Increased white matter changes more anteriorly in both cerebral hemispheres which may reflect post treatment change. Attention on follow-up to differentiate from nonenhancing tumor. Electronically Signed   By: Logan Bores M.D.   On: 04/16/2016 11:56   MRI IMPRESSION: 4.4 x 3.8 x 4.6 CM peripherally enhancing mass centered within the right parietal lobe which is favored to be a primary glial neoplasm. Though this also may reflect a metastatic lesion, this is considered less likely.  Areas of surrounding susceptibility and layering susceptibility the dependent portion reflective of associated hemorrhage. Surrounding T2/FLAIR hyperintense signal reflective of edema with mass effect and slight effacement of the sulci and right  ventricle and up to 3 mm of midline shift. Areas of diffusion restriction medial to the mass, reflective of possibly related to ischemia versus lesion cellularity.   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 Completed concurrent temozolomide and radiation therapy. Received his first cycle of adjuvant temozolomide on 02/21/2016. Patient was due for his second cycle of temozolomide on 03/19/2016 but was postponed due to thrombocytopenia with platelets of 80k -Given his thrombocytopenia his dose for second cycle of temozolomide was maintained at 150mg /m2 -Unfortunately his platelet count is still low at [facility] and therefore his  temozolomide not started yet.   I have decreased his keppra to 500 mg po bid.  I have given them a prescription for SL ativan if seizure activity is noted. BUT there is no documentation of seizure activity. If he remains thrombocytopenic, we may try to completely discontinue his keppra so that he can restart his maintenance temodar. I think the keppra may be a likely source of his ongoing thrombocytopenia.  MRI of the brain was reviewed in detail with the patient and family. Overall improved.   - tapering down his dexamethasone. Reduce to 4mg  po daily x 7 days then 4mg  alternating with 2mg  every other day x 10 days then 2mg  po daily till next appointment. They will continue on 2 mg daily until follow-up next week.  -Has follow-up with Dr. Lisbeth Mckay on 04/19/2016. He has been managing his alternating electrical currents treatment and is also following him up for post radiation follow-up.  All of the patients questions were answered with apparent satisfaction. The patient knows to call the clinic with any problems, questions or concerns.  RTC on 1 week with labs and ongoing recommendations and follow-up.   Orders Placed This Encounter  Procedures  . CBC with Differential    Standing Status:   Future    Number of Occurrences:   1    Standing Expiration Date:   04/26/2017  . Comprehensive metabolic panel    Standing Status:   Future    Number of Occurrences:   1    Standing  Expiration Date:   04/20/2017    This document serves as a record of services personally performed by Ancil Linsey, MD. It was created on her behalf by Toni Amend, a trained medical scribe. The creation of this record is based on the scribe's personal observations and the provider's statements to them. This document has been checked and approved by the attending provider.  I have reviewed the above documentation for accuracy and completeness and I agree with the above.  Molli Hazard, MD   04/20/2016 9:28  AM

## 2016-04-20 NOTE — Patient Instructions (Signed)
[place] at [facility] Discharge Instructions  RECOMMENDATIONS MADE BY THE CONSULTANT AND ANY TEST RESULTS WILL BE SENT TO YOUR REFERRING PHYSICIAN.  You saw Dr. Whitney Muse today. Return to Clinic on Friday with labs. Keppra prescription called in and Ativan prescription given. Please call clinic with any concerns  Thank you for choosing [place] at [facility] to provide your oncology and hematology care.  To afford each patient quality time with our provider, please arrive at least 15 minutes before your scheduled appointment time.   Beginning January 23rd 2017 lab work for the [facility] will be done in the  Main lab at [facility] on 1st floor. If you have a lab appointment with the [facility] please come in thru the  Main Entrance and check in at the main information desk  You need to re-schedule your appointment should you arrive 10 or more minutes late.  We strive to give you quality time with our providers, and arriving late affects you and other patients whose appointments are after yours.  Also, if you no show three or more times for appointments you may be dismissed from the clinic at the providers discretion.     Again, thank you for choosing [facility].  Our hope is that these requests will decrease the amount of time that you wait before being seen by our physicians.       _____________________________________________________________  Should you have questions after your visit to [facility], please contact our office at [phone] between the hours of 8:30 a.m. and 4:30 p.m.  Voicemails left after 4:30 p.m. will not be returned until the following business day.  For prescription refill requests, have your pharmacy contact our office.         Resources For Cancer Patients and their Caregivers ? American Cancer Society: Can assist with transportation, wigs, general needs, runs Look Good Feel  Better.        (913) 318-7104 ? Cancer Care: Provides financial assistance, online support groups, medication/co-pay assistance.  1-800-813-HOPE 580-540-1586) ? [place] Assists [place] cancer patients and their families through emotional , educational and financial support.  724 825 6092 ? [place] DSS Where to apply for food stamps, Medicaid and utility assistance. 808-844-8505 ? RCATS: Transportation to medical appointments. (662)644-8362 ? Social Security Administration: May apply for disability if have a Stage IV cancer. 785-679-4526 986-199-4842 ? [employer], Disability and Transit Services: Assists with nutrition, care and transit needs. [place] Support Programs: @10RELATIVEDAYS @ > Cancer Support Group  2nd Tuesday of the month 1pm-2pm, Journey Room  > Creative Journey  3rd Tuesday of the month 1130am-1pm, Journey Room  > Look Good Feel Better  1st Wednesday of the month 10am-12 noon, Journey Room (Call [place] to register [phone])

## 2016-04-23 ENCOUNTER — Ambulatory Visit (HOSPITAL_COMMUNITY): Payer: Medicare Other | Admitting: Hematology & Oncology

## 2016-04-27 ENCOUNTER — Encounter (HOSPITAL_COMMUNITY): Payer: Medicare Other

## 2016-04-27 ENCOUNTER — Telehealth (HOSPITAL_COMMUNITY): Payer: Self-pay

## 2016-04-27 ENCOUNTER — Encounter (HOSPITAL_BASED_OUTPATIENT_CLINIC_OR_DEPARTMENT_OTHER): Payer: Medicare Other | Admitting: Hematology & Oncology

## 2016-04-27 ENCOUNTER — Encounter (HOSPITAL_COMMUNITY): Payer: Self-pay | Admitting: Hematology & Oncology

## 2016-04-27 VITALS — BP 132/78 | HR 83 | Temp 98.5°F | Resp 16 | Wt 181.1 lb

## 2016-04-27 DIAGNOSIS — C719 Malignant neoplasm of brain, unspecified: Secondary | ICD-10-CM

## 2016-04-27 DIAGNOSIS — C713 Malignant neoplasm of parietal lobe: Secondary | ICD-10-CM

## 2016-04-27 DIAGNOSIS — D696 Thrombocytopenia, unspecified: Secondary | ICD-10-CM | POA: Diagnosis not present

## 2016-04-27 LAB — COMPREHENSIVE METABOLIC PANEL
ALBUMIN: 3.8 g/dL (ref 3.5–5.0)
ALK PHOS: 32 U/L — AB (ref 38–126)
ALT: 22 U/L (ref 17–63)
AST: 20 U/L (ref 15–41)
Anion gap: 6 (ref 5–15)
BILIRUBIN TOTAL: 0.9 mg/dL (ref 0.3–1.2)
BUN: 12 mg/dL (ref 6–20)
CALCIUM: 9.1 mg/dL (ref 8.9–10.3)
CO2: 28 mmol/L (ref 22–32)
CREATININE: 0.63 mg/dL (ref 0.61–1.24)
Chloride: 105 mmol/L (ref 101–111)
GFR calc Af Amer: >60 mL/min (ref >60)
GLUCOSE: 102 mg/dL — AB (ref 65–99)
Potassium: 4 mmol/L (ref 3.5–5.1)
Sodium: 139 mmol/L (ref 135–145)
TOTAL PROTEIN: 6.2 g/dL — AB (ref 6.5–8.1)

## 2016-04-27 LAB — CBC WITH DIFFERENTIAL/PLATELET
Basophils Absolute: 0 10*3/uL (ref 0.0–0.1)
Basophils Relative: 0 %
EOS ABS: 0 10*3/uL (ref 0.0–0.7)
EOS PCT: 1 %
HEMATOCRIT: 38.2 % — AB (ref 39.0–52.0)
HEMOGLOBIN: 13.1 g/dL (ref 13.0–17.0)
LYMPHS ABS: 0.8 10*3/uL (ref 0.7–4.0)
Lymphocytes Relative: 16 %
MCH: 33.4 pg (ref 26.0–34.0)
MCHC: 34.3 g/dL (ref 30.0–36.0)
MCV: 97.4 fL (ref 78.0–100.0)
MONOS PCT: 12 %
Monocytes Absolute: 0.6 10*3/uL (ref 0.1–1.0)
Neutro Abs: 3.7 10*3/uL (ref 1.7–7.7)
Neutrophils Relative %: 72 %
Platelets: 88 10*3/uL — ABNORMAL LOW (ref 150–400)
RBC: 3.92 MIL/uL — ABNORMAL LOW (ref 4.22–5.81)
RDW: 14.6 % (ref 11.5–15.5)
WBC: 5.2 10*3/uL (ref 4.0–10.5)

## 2016-04-27 NOTE — Patient Instructions (Signed)
[place] at [facility] Discharge Instructions  RECOMMENDATIONS MADE BY THE CONSULTANT AND ANY TEST RESULTS WILL BE SENT TO YOUR REFERRING PHYSICIAN.  You were seen by Dr. Whitney Muse today.  Return to clinic next Friday with labs. Call clinic if any problems or concerns.   Thank you for choosing [place] at [facility] to provide your oncology and hematology care.  To afford each patient quality time with our provider, please arrive at least 15 minutes before your scheduled appointment time.   Beginning January 23rd 2017 lab work for the [facility] will be done in the  Main lab at [facility] on 1st floor. If you have a lab appointment with the [facility] please come in thru the  Main Entrance and check in at the main information desk  You need to re-schedule your appointment should you arrive 10 or more minutes late.  We strive to give you quality time with our providers, and arriving late affects you and other patients whose appointments are after yours.  Also, if you no show three or more times for appointments you may be dismissed from the clinic at the providers discretion.     Again, thank you for choosing [facility].  Our hope is that these requests will decrease the amount of time that you wait before being seen by our physicians.       _____________________________________________________________  Should you have questions after your visit to [facility], please contact our office at [phone] between the hours of 8:30 a.m. and 4:30 p.m.  Voicemails left after 4:30 p.m. will not be returned until the following business day.  For prescription refill requests, have your pharmacy contact our office.         Resources For Cancer Patients and their Caregivers ? American Cancer Society: Can assist with transportation, wigs, general needs, runs Look Good Feel Better.        (726)094-0295 ? Cancer  Care: Provides financial assistance, online support groups, medication/co-pay assistance.  1-800-813-HOPE 406-749-9320) ? [facility] Assists [place] cancer patients and their families through emotional , educational and financial support.  3671872323 ? [place] DSS Where to apply for food stamps, Medicaid and utility assistance. (641) 041-0823 ? RCATS: Transportation to medical appointments. (224)061-4426 ? Social Security Administration: May apply for disability if have a Stage IV cancer. 906 176 0090 912 638 9954 ? [employer], Disability and Transit Services: Assists with nutrition, care and transit needs. [place] Support Programs: @10RELATIVEDAYS @ > Cancer Support Group  2nd Tuesday of the month 1pm-2pm, Journey Room  > Creative Journey  3rd Tuesday of the month 1130am-1pm, Journey Room  > Look Good Feel Better  1st Wednesday of the month 10am-12 noon, Journey Room (Call [place] to register [phone])

## 2016-04-27 NOTE — Progress Notes (Signed)
Phillip Mckay      HEMATOLOGY/ONCOLOGY PROGRESS NOTE  Date of Service: 04/27/2016  Patient Care Team: [name], MD as PCP - General (Internal Medicine)  CHIEF COMPLAINTS/PURPOSE OF CONSULTATION:  Continued treatment of GBM.  HISTORY OF PRESENTING ILLNESS:  Phillip Mckay is a wonderful 75 y.o. male who has been referred to [place] by Dr Neijstrom/Dr Sherrie Sport for evaluation and management of GBM.  Patient subsequently had a subtotal resection of about 80% of the tumor mass by Dr. Owens Shark on 10/25/2015 at [facility].  He was apparently discharged on dexamethasone on 4 mg 4 times a day as well as Keppra 750 mg twice a day. There is no documented seizure history.  He was subsequently started on concurrent radiation therapy plus temozolomide (75 mg meter squared = 140 mg daily while on radiation) chemotherapy. Dr. Lisbeth Renshaw is his radiation oncologist. He was on Bactrim DS MWF for PCP/PJP prophylaxis.  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 He is currently on a steroid taper.   He was noted to have thrombocytopenia which was low enough to delay his start of adjuvant temozolomide chemotherapy after completion of his chemoradiation.  He was started on his first cycle of adjuvant temozolomide at 150 mg/m (280 mg daily from D1 to D5) On 02/21/2016.  He was due for his second cycle on 03/19/2016.  His platelet counts were down to 80k and his temozolomide was held. Dose escalation to 200 mg/m was not done and he was asked to wait to restrict his improvement prior to proceeding with his second cycle of temozolomide.  [name] returns to the [facility] today accompanied by his wife and son.  His wife notes that sometimes, he gets up at night and falls with the heavy bag attached to his device.  Since the last time he visited, he notes his week has been good.  They went up to [place] on Tuesday, and he bought some signs. He chuckles a little bit as he reveals this information. His wife notes that, Wednesday, he was a little tired, but that Tuesday was a good day. He notes that they went to the [employer] and that the ride was good. He says "I don't know how many signs I bought; 10 or 13." His wife says "when you said take him out and let him do things he used to do, I was like 'oh my Lord.'" She remarks that he bought 12 signs.  He confirms that he's cut back on the Newborn; he hasn't had to use the Lorazepam because he hasn't had any problems or difficulties.  Mr. Huaman wife notes that they only give enough Temodar pills for one cycle, and then have to wait to refill it.  His wife reports that Mr. Manthe started doing one half dexamethazone daily; they were advised that the can start doing a half every other day.   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 In his wife have 2 sons and they adopted one daughter Phillip Mckay is with him today.  #4 history of hypertension #5 history of dyslipidemia  SURGICAL HISTORY:   10/25/2015 Surgery  Subtotal resection Dr. Owens Shark   SOCIAL HISTORY: Social History   Social History  .  Marital Status: Married    Spouse Name: N/A  . Number of Children: N/A  . Years of Education: N/A   Occupational History  . Not on file.   Social History Main Topics  . Smoking status: Former Research scientist (life sciences)  . Smokeless tobacco: Not on file  . Alcohol Use: No  . Drug Use: No  . Sexual Activity: No   Other Topics Concern  . Not on file   Social History Narrative  former smoker quit at age 25 yo Worked as a Pharmacist, community for about 40 years.  Married 54 years, 63 in September 2017 2 sons and 1 daughter  He notes that he did a lot of driving and "heavy work" in  his career. He also used to get up every morning at 5 in the morning and walk 3 miles, every day.  FAMILY HISTORY: Family History  Problem Relation Age of Onset  . Cancer Mother   . Diabetes Mother   . Cancer Father   . Cancer Brother     ALLERGIES:  has No Known Allergies.  MEDICATIONS:  Current Outpatient Prescriptions  Medication Sig Dispense Refill  . b complex vitamins tablet Take 1 tablet by mouth daily.    . B-D UF III MINI PEN NEEDLES 31G X 5 MM MISC     . B-D ULTRA-FINE 33 LANCETS MISC by Does not apply route.    Phillip Mckay dexamethasone (DECADRON) 4 MG tablet Reduce to 4mg  po daily x 7 days then 4mg  alternating with 2mg  every other day x 10 days then 2mg  po daily till next appointment 60 tablet 0  . Docusate Sodium 100 MG capsule Take by mouth.    . fluconazole (DIFLUCAN) 100 MG tablet Reported on 04/20/2016    . gabapentin (NEURONTIN) 300 MG capsule Reported on 04/20/2016    . glucose blood (FREESTYLE INSULINX TEST) test strip     . latanoprost (XALATAN) 0.005 % ophthalmic solution Apply to eye. Reported on 04/20/2016    . levETIRAcetam (KEPPRA) 500 MG tablet Take 1 tablet (500 mg total) by mouth 2 (two) times daily. 60 tablet 3  . lisinopril (PRINIVIL,ZESTRIL) 40 MG tablet     . LORazepam (ATIVAN) 1 MG tablet Take as needed for seizure. Take 1 mg at onset may repeat in 5 minute intervals X 3 30 tablet 3  . metFORMIN (GLUCOPHAGE) 1000 MG tablet Take by mouth.    Phillip Mckay omeprazole (PRILOSEC) 20 MG capsule Take 20 mg by mouth daily.    . ondansetron (ZOFRAN) 8 MG tablet Take by mouth.    . potassium chloride SA (K-DUR,KLOR-CON) 20 MEQ tablet Take 1 tablet (20 mEq total) by mouth daily. 30 tablet 1  . Probiotic Product [facility]) CAPS Take by mouth.    . prochlorperazine (COMPAZINE) 10 MG tablet Reported on 04/20/2016    . simvastatin (ZOCOR) 20 MG tablet Reported on 04/20/2016    . tamsulosin (FLOMAX) 0.4 MG CAPS capsule Take by mouth.    . tetrahydrozoline 0.05 % ophthalmic solution Reported  on 04/20/2016    . traMADol (ULTRAM) 50 MG tablet     . travoprost, benzalkonium, (TRAVATAN) 0.004 % ophthalmic solution 1 drop at bedtime.    . Vitamin D, Ergocalciferol, (DRISDOL) 50000 units CAPS capsule Take by mouth.    . Glycerin-Hypromellose-PEG 400 0.2-0.2-1 % SOLN Apply to eye. Reported on 04/20/2016    . insulin aspart (NOVOLOG) 100 UNIT/ML injection Inject into the skin.     No current facility-administered medications for this visit.  REVIEW OF SYSTEMS:   14 point review of systems was performed and is negative except as detailed under history of present illness and above    PHYSICAL EXAMINATION: ECOG PERFORMANCE STATUS: 2 - Symptomatic, <50% confined to bed  . Filed Vitals:   04/27/16 0818  BP: 132/78  Pulse: 83  Temp: 98.5 F (36.9 C)  Resp: 16   Filed Weights   04/27/16 0818  Weight: 181 lb 1.6 oz (82.146 kg)   .Body mass index is 28.36 kg/(m^2).  GENERAL:alert, in no acute distress and comfortable, has wires from the alternating electrical currents machine on his head. Able to get onto exam table with assistance SKIN: skin color, texture, turgor are normal, no rashes or significant lesions EYES: normal, conjunctiva are pink and non-injected, sclera clear OROPHARYNX:no exudate, no erythema and lips, buccal mucosa, and tongue normal  NECK: supple, no JVD, thyroid normal size, non-tender, without nodularity LYMPH:  no palpable lymphadenopathy in the cervical, axillary or inguinal LUNGS: clear to auscultation with normal respiratory effort HEART: regular rate & rhythm,  no murmurs and no lower extremity edema ABDOMEN: abdomen soft, non-tender, normoactive bowel sounds  Musculoskeletal: no cyanosis of digits and no clubbing  PSYCH: alert & oriented x 3 with fluent speech NEURO: Moving all 4 extremities, some clumsiness in his left upper extremity  LABORATORY DATA:  I have reviewed the data as listed  . CBC Latest Ref Rng 04/27/2016 04/16/2016 04/02/2016  WBC  4.0 - 10.5 K/uL 5.2 5.8 6.2  Hemoglobin 13.0 - 17.0 g/dL 13.1 13.8 14.5  Hematocrit 39.0 - 52.0 % 38.2(L) 40.2 41.8  Platelets 150 - 400 K/uL SPECIMEN CHECKED FOR CLOTS 80(L) 86(L)   . CMP Latest Ref Rng 04/27/2016 04/16/2016 04/02/2016  Glucose 65 - 99 mg/dL 102(H) 169(H) 316(H)  BUN 6 - 20 mg/dL 12 12 18   Creatinine 0.61 - 1.24 mg/dL 0.63 0.65 0.87  Sodium 135 - 145 mmol/L 139 139 135  Potassium 3.5 - 5.1 mmol/L 4.0 3.4(L) 4.9  Chloride 101 - 111 mmol/L 105 103 99(L)  CO2 22 - 32 mmol/L 28 27 25   Calcium 8.9 - 10.3 mg/dL 9.1 9.4 8.9  Total Protein 6.5 - 8.1 g/dL 6.2(L) 6.4(L) 5.8(L)  Total Bilirubin 0.3 - 1.2 mg/dL 0.9 0.8 0.7  Alkaline Phos 38 - 126 U/L 32(L) 40 40  AST 15 - 41 U/L 20 17 22   ALT 17 - 63 U/L 22 24 26    Results for ERVIL, LAINHART (MRN [id number])   Ref. Range 04/02/2016 14:36 04/16/2016 09:31 04/27/2016 07:53  Platelets Latest Ref Range: 150 - 400 K/uL 86 (L) 80 (L) 88 (L)    PATHOLOGY: Final Diagnosis  Brain, right parietal, biopsies (specimens 1-2): - Glioblastoma, WHO grade 4; see comment.  Electronically signed by Golden Pop, MD on 10/26/2015 at [place]: I have personally reviewed the radiological images as listed and agreed with the findings in the report. Dg Skull 1-3 Views  04/16/2016  CLINICAL DATA:  75 year old male with frequent headaches, undergoing MRI clearance. Previous craniotomy and craniectomy, personal history of glioblastoma up the right hemisphere treated with resection earlier this year. And also previous left occipital craniectomy. Initial encounter. EXAM: SKULL - 1-3 VIEW COMPARISON:  [facility] Head CT 02/04/2016, brain MRI 11/18/2015, and earlier. FINDINGS: Left occipital/ suboccipital craniectomy changes with malleable plate and screws and right side parietal craniotomy defect re- demonstrated. These postoperative changes appear stable since the February MRI this year and should not  preclude a  repeat MRI. Absent dentition. No metallic foreign bodies identified about the orbits. IMPRESSION: Stable postoperative changes since February. No contraindication to repeat MRI. Electronically Signed   By: Genevie Ann M.D.   On: 04/16/2016 11:15   Mr [name] [id number] Contrast  04/16/2016  CLINICAL DATA:  Right parietal glioblastoma status post subtotal resection on 10/25/2015 followed by radiation therapy and Temodar. EXAM: MRI HEAD WITHOUT AND WITH CONTRAST TECHNIQUE: Multiplanar, multiecho pulse sequences of the brain and surrounding structures were obtained without and with intravenous contrast. CONTRAST:  37mL MULTIHANCE GADOBENATE DIMEGLUMINE 529 MG/ML IV SOLN COMPARISON:  11/18/2015 FINDINGS: Sequelae of right parietal craniotomy and tumor resection are again identified. Chronic blood products are again seen in the right parietal resection cavity which has decreased in size, currently measuring 3.2 cm (previously 4.0 cm). T2 hyperintensity immediately adjacent to the resection cavity, specifically medially and superiorly, has decreased. Enhancement along the margins of the resection cavity on the prior study has largely resolved, with only minimal residual enhancement noted anteriorly. No abnormal enhancement is identified distant to the resection site. Confluent T2 hyperintensity more anteriorly involving the centrum semiovale, corona radiata, and external capsule on the right has increased from the prior study without associated mass effect. Similar but slightly less prominent changes are present at these same locations in the left cerebral hemisphere, also increased from prior. There is no evidence of acute infarct, midline shift, or extra-axial fluid collection. Sequelae of prior left occipital craniectomy are again identified with unchanged underlying left cerebellar encephalomalacia. There is mild cerebral atrophy. Orbits are unremarkable. Paranasal sinuses and mastoid air cells are clear. Major intracranial  vascular flow voids are preserved. IMPRESSION: 1. Overall improved appearance of the right parietal resection site with largely resolved enhancement and decreased immediately surrounding T2 signal abnormality. 2. Increased white matter changes more anteriorly in both cerebral hemispheres which may reflect post treatment change. Attention on follow-up to differentiate from nonenhancing tumor. Electronically Signed   By: Logan Bores M.D.   On: 04/16/2016 11:56   MRI IMPRESSION: 4.4 x 3.8 x 4.6 CM peripherally enhancing mass centered within the right parietal lobe which is favored to be a primary glial neoplasm. Though this also may reflect a metastatic lesion, this is considered less likely.  Areas of surrounding susceptibility and layering susceptibility the dependent portion reflective of associated hemorrhage. Surrounding T2/FLAIR hyperintense signal reflective of edema with mass effect and slight effacement of the sulci and right  ventricle and up to 3 mm of midline shift. Areas of diffusion restriction medial to the mass, reflective of possibly related to ischemia versus lesion cellularity.     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 Completed concurrent temozolomide and radiation therapy. Received his first cycle of adjuvant temozolomide on 02/21/2016. Patient was due for his second cycle of temozolomide on 03/19/2016 but was postponed due to thrombocytopenia with platelets of 80k Given his thrombocytopenia his dose for second cycle of temozolomide was maintained at 150mg /m2 Unfortunately his platelet count is still low at 88k and therefore his temozolomide not started yet.   I have discontinued his keppra. There is no documentation of seizures and I believe this is the cause of his thrombocytopenia. Family is willing to discontinue.  They will let me know if they encounter any problems. We will see him back next Friday with repeat CBC.  Platelet count permitting will restart his maintenance temodar.   He is to continue to taper of his dexamethasone. He is now on qod dosing. I advised his wife that realistically he should be able to completely discontinue at our visit next week.    Orders Placed This Encounter  Procedures  . CBC with Differential    Standing Status:   Future    Number of Occurrences:   1    Standing Expiration Date:   04/27/2017  . Comprehensive metabolic panel    Standing Status:   Future    Number of Occurrences:   1    Standing Expiration Date:   04/27/2017    This document serves as a record of services personally performed by Ancil Linsey, MD. It was created on her behalf by Toni Amend, a trained medical scribe. The creation of this record is based on the scribe's personal observations and the provider's statements to them. This document has been checked and approved by the attending provider.  I have reviewed the above documentation for accuracy and completeness and I agree with the above.  Molli Hazard, MD   04/27/2016 8:52 AM

## 2016-04-27 NOTE — Telephone Encounter (Signed)
Per Dr. Whitney Muse, called patient's wife and instructed her to stop patients keppra. She found the medication bottle while I was on the phone with her to be sure she had the correct med to stop. Understanding verbalized.

## 2016-05-04 ENCOUNTER — Encounter (HOSPITAL_BASED_OUTPATIENT_CLINIC_OR_DEPARTMENT_OTHER): Payer: Medicare Other | Admitting: Hematology & Oncology

## 2016-05-04 ENCOUNTER — Encounter (HOSPITAL_COMMUNITY): Payer: Self-pay | Admitting: Hematology & Oncology

## 2016-05-04 ENCOUNTER — Encounter (HOSPITAL_COMMUNITY): Payer: Medicare Other

## 2016-05-04 VITALS — BP 95/67 | HR 106 | Temp 97.8°F | Resp 16 | Wt 179.1 lb

## 2016-05-04 DIAGNOSIS — D696 Thrombocytopenia, unspecified: Secondary | ICD-10-CM

## 2016-05-04 DIAGNOSIS — C719 Malignant neoplasm of brain, unspecified: Secondary | ICD-10-CM

## 2016-05-04 DIAGNOSIS — C713 Malignant neoplasm of parietal lobe: Secondary | ICD-10-CM

## 2016-05-04 LAB — CBC WITH DIFFERENTIAL/PLATELET
BASOS ABS: 0 10*3/uL (ref 0.0–0.1)
BASOS PCT: 1 %
EOS ABS: 0 10*3/uL (ref 0.0–0.7)
EOS PCT: 1 %
HCT: 37.4 % — ABNORMAL LOW (ref 39.0–52.0)
Hemoglobin: 12.9 g/dL — ABNORMAL LOW (ref 13.0–17.0)
LYMPHS ABS: 1 10*3/uL (ref 0.7–4.0)
LYMPHS PCT: 18 %
MCH: 33.3 pg (ref 26.0–34.0)
MCHC: 34.5 g/dL (ref 30.0–36.0)
MCV: 96.6 fL (ref 78.0–100.0)
MONOS PCT: 9 %
Monocytes Absolute: 0.5 10*3/uL (ref 0.1–1.0)
Neutro Abs: 3.9 10*3/uL (ref 1.7–7.7)
Neutrophils Relative %: 71 %
PLATELETS: 122 10*3/uL — AB (ref 150–400)
RBC: 3.87 MIL/uL — AB (ref 4.22–5.81)
RDW: 14.9 % (ref 11.5–15.5)
WBC: 5.5 10*3/uL (ref 4.0–10.5)

## 2016-05-04 LAB — COMPREHENSIVE METABOLIC PANEL
ALBUMIN: 3.8 g/dL (ref 3.5–5.0)
ALT: 23 U/L (ref 17–63)
AST: 22 U/L (ref 15–41)
Alkaline Phosphatase: 32 U/L — ABNORMAL LOW (ref 38–126)
Anion gap: 10 (ref 5–15)
BUN: 11 mg/dL (ref 6–20)
CHLORIDE: 107 mmol/L (ref 101–111)
CO2: 21 mmol/L — AB (ref 22–32)
Calcium: 9.4 mg/dL (ref 8.9–10.3)
Creatinine, Ser: 0.7 mg/dL (ref 0.61–1.24)
GFR calc Af Amer: >60 mL/min (ref >60)
Glucose, Bld: 177 mg/dL — ABNORMAL HIGH (ref 65–99)
POTASSIUM: 3.9 mmol/L (ref 3.5–5.1)
SODIUM: 138 mmol/L (ref 135–145)
Total Bilirubin: 0.7 mg/dL (ref 0.3–1.2)
Total Protein: 6 g/dL — ABNORMAL LOW (ref 6.5–8.1)

## 2016-05-04 MED ORDER — TRAMADOL HCL 50 MG PO TABS: 50.0000 mg | ORAL_TABLET | Freq: Four times a day (QID) | ORAL | Status: DC | PRN

## 2016-05-04 NOTE — Patient Instructions (Signed)
[place] at [facility] Discharge Instructions  RECOMMENDATIONS MADE BY THE CONSULTANT AND ANY TEST RESULTS WILL BE SENT TO YOUR REFERRING PHYSICIAN.  You saw Dr.Penland today. Return for follow up in 2 weeks for labs and follow up. Start Temodar tonight-05/04/16.  Thank you for choosing [place] at [facility] to provide your oncology and hematology care.  To afford each patient quality time with our provider, please arrive at least 15 minutes before your scheduled appointment time.   Beginning January 23rd 2017 lab work for the [facility] will be done in the  Main lab at [facility] on 1st floor. If you have a lab appointment with the [facility] please come in thru the  Main Entrance and check in at the main information desk  You need to re-schedule your appointment should you arrive 10 or more minutes late.  We strive to give you quality time with our providers, and arriving late affects you and other patients whose appointments are after yours.  Also, if you no show three or more times for appointments you may be dismissed from the clinic at the providers discretion.     Again, thank you for choosing [facility].  Our hope is that these requests will decrease the amount of time that you wait before being seen by our physicians.       _____________________________________________________________  Should you have questions after your visit to [facility], please contact our office at [phone] between the hours of 8:30 a.m. and 4:30 p.m.  Voicemails left after 4:30 p.m. will not be returned until the following business day.  For prescription refill requests, have your pharmacy contact our office.         Resources For Cancer Patients and their Caregivers ? American Cancer Society: Can assist with transportation, wigs, general needs, runs Look Good Feel Better.        807-776-0815 ? Cancer  Care: Provides financial assistance, online support groups, medication/co-pay assistance.  1-800-813-HOPE (316) 226-6759) ? [place] Assists [place] cancer patients and their families through emotional , educational and financial support.  941-599-6985 ? [place] DSS Where to apply for food stamps, Medicaid and utility assistance. 438-680-9091 ? [facility]: Transportation to medical appointments. 256 221 6509 ? Social Security Administration: May apply for disability if have a Stage IV cancer. 435 255 9147 337 335 9978 ? [employer], Disability and Transit Services: Assists with nutrition, care and transit needs. [place] Support Programs: @10RELATIVEDAYS @ > Cancer Support Group  2nd Tuesday of the month 1pm-2pm, Journey Room  > Creative Journey  3rd Tuesday of the month 1130am-1pm, Journey Room  > Look Good Feel Better  1st Wednesday of the month 10am-12 noon, Journey Room (Call [place] to register [phone])

## 2016-05-04 NOTE — Progress Notes (Signed)
Marland Kitchen    HEMATOLOGY/ONCOLOGY CONSULTATION NOTE  Date of Service: 05/04/2016  Patient Care Team: [name], MD as PCP - General (Internal Medicine)  CHIEF COMPLAINTS/PURPOSE OF CONSULTATION:   Continued treatment of GBM.  HISTORY OF PRESENTING ILLNESS:  Phillip Mckay is a wonderful 75 y.o. male who has been referred to [place] by Dr Neijstrom/Dr Sherrie Sport for evaluation and management of GBM Patient subsequently had a subtotal resection of about 80% of the tumor mass by Dr. Owens Shark on 10/25/2015 at [facility]. He was subsequently started on concurrent radiation therapy plus temozolomide (75 mg meter squared = 140 mg daily while on radiation) chemotherapy. Dr. Lisbeth Renshaw is his radiation oncologist.  He was started on his first cycle of adjuvant temozolomide at 150 mg/m (280 mg daily from D1 to D5) On 02/21/2016.  He was due for his second cycle on 03/19/2016.  His platelet counts were down to 80k and his temozolomide was held. Dose escalation to 200 mg/m was not done and he was asked to wait to restrict his improvement prior to proceeding with his second cycle of temozolomide.  They really have no new concerns today. 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   Reports feeling nauseated for two days. He denies vomiting, but he felt like he might. He took medication that subsided this nausea. He has not had an appetite over the past couple of days. He notes this is intermittent.   His wife notes that he thinks he can drive, there has been a little "disagreement" about this.     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 In his wife have 2 sons and they adopted one daughter Phillip Mckay is with him today.  #4 history of hypertension #5 history of  dyslipidemia  SURGICAL HISTORY:   10/25/2015 Surgery  Subtotal resection Dr. Owens Shark   SOCIAL HISTORY: Social History   Social History  . Marital Status: Married    Spouse Name: N/A  . Number of Children: N/A  . Years of Education: N/A   Occupational History  . Not on file.   Social History Main Topics  . Smoking status: Former Research scientist (life sciences)  . Smokeless tobacco: Not on file  . Alcohol Use: No  . Drug Use: No  . Sexual Activity: No   Other Topics Concern  . Not on file   Social History Narrative  former smoker quit at age 18 yo Worked as a Pharmacist, community for about 40 years.  FAMILY HISTORY: Family History  Problem Relation Age of Onset  . Cancer Mother   . Diabetes Mother   . Cancer Father   . Cancer Brother     ALLERGIES:  has No Known Allergies.  MEDICATIONS:  Current Outpatient Prescriptions  Medication Sig Dispense Refill  . b complex vitamins tablet Take 1 tablet by mouth daily.    . B-D UF III MINI PEN NEEDLES 31G X 5 MM MISC     . B-D ULTRA-FINE 33 LANCETS MISC by Does not apply route.    Marland Kitchen dexamethasone (DECADRON) 4 MG tablet Reduce to 4mg  po daily x 7 days then 4mg  alternating with 2mg  every other day x 10 days then 2mg  po daily till next appointment 60 tablet 0  . Docusate Sodium 100 MG  capsule Take by mouth.    . fluconazole (DIFLUCAN) 100 MG tablet Reported on 04/20/2016    . gabapentin (NEURONTIN) 300 MG capsule Reported on 04/20/2016    . glucose blood (FREESTYLE INSULINX TEST) test strip     . Glycerin-Hypromellose-PEG 400 0.2-0.2-1 % SOLN Apply to eye. Reported on 04/20/2016    . insulin aspart (NOVOLOG) 100 UNIT/ML injection Inject into the skin.    Marland Kitchen latanoprost (XALATAN) 0.005 % ophthalmic solution Apply to eye. Reported on 04/20/2016    . lisinopril (PRINIVIL,ZESTRIL) 40 MG tablet     . LORazepam (ATIVAN) 1 MG tablet Take as needed for seizure. Take 1 mg at onset may repeat in 5 minute intervals X 3 30 tablet 3  . metFORMIN (GLUCOPHAGE) 1000 MG tablet Take by  mouth.    Marland Kitchen omeprazole (PRILOSEC) 20 MG capsule Take 20 mg by mouth daily.    . ondansetron (ZOFRAN) 8 MG tablet Take by mouth.    . potassium chloride SA (K-DUR,KLOR-CON) 20 MEQ tablet Take 1 tablet (20 mEq total) by mouth daily. 30 tablet 1  . Probiotic Product [facility]) CAPS Take by mouth.    . prochlorperazine (COMPAZINE) 10 MG tablet Reported on 04/20/2016    . simvastatin (ZOCOR) 20 MG tablet Reported on 04/20/2016    . tamsulosin (FLOMAX) 0.4 MG CAPS capsule Take by mouth.    . tetrahydrozoline 0.05 % ophthalmic solution Reported on 04/20/2016    . traMADol (ULTRAM) 50 MG tablet Take 1 tablet (50 mg total) by mouth every 6 (six) hours as needed. 60 tablet 2  . travoprost, benzalkonium, (TRAVATAN) 0.004 % ophthalmic solution 1 drop at bedtime.    . Vitamin D, Ergocalciferol, (DRISDOL) 50000 units CAPS capsule Take by mouth.     No current facility-administered medications for this visit.    REVIEW OF SYSTEMS:    Positive for appetite loss Appetite loss over the last couple days Positive for nausea Nausea managed with medication 14 point review of systems was performed and is negative except as detailed under history of present illness and above   PHYSICAL EXAMINATION: ECOG PERFORMANCE STATUS: 2 - Symptomatic, <50% confined to bed   Filed Vitals:   05/04/16 1600  BP: 95/67  Pulse: 106  Temp: 97.8 F (36.6 C)  Resp: 16   Filed Weights   05/04/16 1600  Weight: 179 lb 1.6 oz (81.239 kg)   .Body mass index is 28.04 kg/(m^2).  GENERAL:alert, in no acute distress and comfortable, has wires from the alternating electrical currents machine on his head SKIN: skin color, texture, turgor are normal, no rashes or significant lesions EYES: normal, conjunctiva are pink and non-injected, sclera clear OROPHARYNX:no exudate, no erythema and lips, buccal mucosa, and tongue normal  NECK: supple, no JVD, thyroid normal size, non-tender, without nodularity LYMPH:  no palpable  lymphadenopathy in the cervical, axillary or inguinal LUNGS: clear to auscultation with normal respiratory effort HEART: regular rate & rhythm,  no murmurs and no lower extremity edema ABDOMEN: abdomen soft, non-tender, normoactive bowel sounds  Musculoskeletal: no cyanosis of digits and no clubbing  PSYCH: alert & oriented x 3 with fluent speech NEURO: Moving all 4 extremities, some clumsiness in his left upper extremity  LABORATORY DATA:  I have reviewed the data as listed  . CBC Latest Ref Rng 05/04/2016 04/27/2016 04/16/2016  WBC 4.0 - 10.5 K/uL 5.5 5.2 5.8  Hemoglobin 13.0 - 17.0 g/dL 12.9(L) 13.1 13.8  Hematocrit 39.0 - 52.0 % 37.4(L) 38.2(L) 40.2  Platelets 150 -  400 K/uL 122(L) 88(L) 80(L)   . CMP Latest Ref Rng 05/04/2016 04/27/2016 04/16/2016  Glucose 65 - 99 mg/dL 177(H) 102(H) 169(H)  BUN 6 - 20 mg/dL 11 12 12   Creatinine 0.61 - 1.24 mg/dL 0.70 0.63 0.65  Sodium 135 - 145 mmol/L 138 139 139  Potassium 3.5 - 5.1 mmol/L 3.9 4.0 3.4(L)  Chloride 101 - 111 mmol/L 107 105 103  CO2 22 - 32 mmol/L 21(L) 28 27  Calcium 8.9 - 10.3 mg/dL 9.4 9.1 9.4  Total Protein 6.5 - 8.1 g/dL 6.0(L) 6.2(L) 6.4(L)  Total Bilirubin 0.3 - 1.2 mg/dL 0.7 0.9 0.8  Alkaline Phos 38 - 126 U/L 32(L) 32(L) 40  AST 15 - 41 U/L 22 20 17   ALT 17 - 63 U/L 23 22 24     Final Diagnosis  Brain, right parietal, biopsies (specimens 1-2): - Glioblastoma, WHO grade 4; see comment.  Electronically signed by Golden Pop, MD on 10/26/2015 at [facility]: I have personally reviewed the radiological images as listed and agreed with the findings in the report. Dg Skull 1-3 Views  04/16/2016  CLINICAL DATA:  75 year old male with frequent headaches, undergoing MRI clearance. Previous craniotomy and craniectomy, personal history of glioblastoma up the right hemisphere treated with resection earlier this year. And also previous left occipital craniectomy. Initial encounter. EXAM: SKULL - 1-3 VIEW  COMPARISON:  [facility] Head CT 02/04/2016, brain MRI 11/18/2015, and earlier. FINDINGS: Left occipital/ suboccipital craniectomy changes with malleable plate and screws and right side parietal craniotomy defect re- demonstrated. These postoperative changes appear stable since the February MRI this year and should not preclude a repeat MRI. Absent dentition. No metallic foreign bodies identified about the orbits. IMPRESSION: Stable postoperative changes since February. No contraindication to repeat MRI. Electronically Signed   By: Genevie Ann M.D.   On: 04/16/2016 11:15   Mr [name] [id number] Contrast  04/16/2016  CLINICAL DATA:  Right parietal glioblastoma status post subtotal resection on 10/25/2015 followed by radiation therapy and Temodar. EXAM: MRI HEAD WITHOUT AND WITH CONTRAST TECHNIQUE: Multiplanar, multiecho pulse sequences of the brain and surrounding structures were obtained without and with intravenous contrast. CONTRAST:  88mL MULTIHANCE GADOBENATE DIMEGLUMINE 529 MG/ML IV SOLN COMPARISON:  11/18/2015 FINDINGS: Sequelae of right parietal craniotomy and tumor resection are again identified. Chronic blood products are again seen in the right parietal resection cavity which has decreased in size, currently measuring 3.2 cm (previously 4.0 cm). T2 hyperintensity immediately adjacent to the resection cavity, specifically medially and superiorly, has decreased. Enhancement along the margins of the resection cavity on the prior study has largely resolved, with only minimal residual enhancement noted anteriorly. No abnormal enhancement is identified distant to the resection site. Confluent T2 hyperintensity more anteriorly involving the centrum semiovale, corona radiata, and external capsule on the right has increased from the prior study without associated mass effect. Similar but slightly less prominent changes are present at these same locations in the left cerebral hemisphere, also increased from  prior. There is no evidence of acute infarct, midline shift, or extra-axial fluid collection. Sequelae of prior left occipital craniectomy are again identified with unchanged underlying left cerebellar encephalomalacia. There is mild cerebral atrophy. Orbits are unremarkable. Paranasal sinuses and mastoid air cells are clear. Major intracranial vascular flow voids are preserved. IMPRESSION: 1. Overall improved appearance of the right parietal resection site with largely resolved enhancement and decreased immediately surrounding T2 signal abnormality. 2. Increased white matter changes more anteriorly  in both cerebral hemispheres which may reflect post treatment change. Attention on follow-up to differentiate from nonenhancing tumor. Electronically Signed   By: Logan Bores M.D.   On: 04/16/2016 11:56   MRI IMPRESSION: 4.4 x 3.8 x 4.6 CM peripherally enhancing mass centered within the right parietal lobe which is favored to be a primary glial neoplasm. Though this also may reflect a metastatic lesion, this is considered less likely.  Areas of surrounding susceptibility and layering susceptibility the dependent portion reflective of associated hemorrhage. Surrounding T2/FLAIR hyperintense signal reflective of edema with mass effect and slight effacement of the sulci and right  ventricle and up to 3 mm of midline shift. Areas of diffusion restriction medial to the mass, reflective of possibly related to ischemia versus lesion cellularity.   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 Completed concurrent temozolomide and radiation therapy. Received his first cycle of adjuvant temozolomide on 02/21/2016. Patient was due for his second cycle of temozolomide on 03/19/2016 but was postponed due to thrombocytopenia with platelets of 80k Given  his thrombocytopenia his dose for second cycle of temozolomide was maintained at 150mg /m2  Platelet count today is excellent at 122K. I am going to have him restart his temodar. He will start at 150 mg/m2. Will draw counts on day 22, if appropriate will consider escalating next cycle to 200 mg/m2.   I encouraged the patient to take an antinausea pill one hour prior to taking Temodar. In addition, I have encouraged them to use anti-nausea medication first thing in the am while on temodar.   The patient is currently taking 1 mg dexamethasone every other day. I advised he can discontinue dexamethasone.  I have written the patient a refill for Tramadol today. I will start working on refills for his Temodar which he gets through [facility].   I advised the patient to avoid going out in the heat when he starts chemotherapy treatment. We discussed driving, he knows he is not supposed to drive.   He is scheduled to see his PCP on 8/4.  He will return for follow up and labs in approximately 2 weeks, on August 3rd.  All of the patients questions were answered with apparent satisfaction. The patient knows to call the clinic with any problems, questions or concerns.  This document serves as a record of services personally performed by Ancil Linsey, MD. It was created on her behalf by Arlyce Harman, a trained medical scribe. The creation of this record is based on the scribe's personal observations and the provider's statements to them. This document has been checked and approved by the attending provider.  I have reviewed the above documentation for accuracy and completeness, and I agree with the above.  Molli Hazard, MD  05/04/2016 4:29 PM

## 2016-05-13 ENCOUNTER — Encounter (HOSPITAL_COMMUNITY): Payer: Self-pay | Admitting: Hematology & Oncology

## 2016-05-17 ENCOUNTER — Encounter (HOSPITAL_COMMUNITY): Payer: Medicare Other | Attending: Hematology | Admitting: Hematology & Oncology

## 2016-05-17 ENCOUNTER — Encounter (HOSPITAL_COMMUNITY): Payer: Self-pay | Admitting: Hematology & Oncology

## 2016-05-17 ENCOUNTER — Encounter (HOSPITAL_COMMUNITY): Payer: Medicare Other

## 2016-05-17 VITALS — BP 137/72 | HR 80 | Temp 97.8°F | Resp 20 | Wt 183.1 lb

## 2016-05-17 DIAGNOSIS — E785 Hyperlipidemia, unspecified: Secondary | ICD-10-CM | POA: Diagnosis not present

## 2016-05-17 DIAGNOSIS — E099 Drug or chemical induced diabetes mellitus without complications: Secondary | ICD-10-CM | POA: Insufficient documentation

## 2016-05-17 DIAGNOSIS — Z794 Long term (current) use of insulin: Secondary | ICD-10-CM | POA: Insufficient documentation

## 2016-05-17 DIAGNOSIS — D696 Thrombocytopenia, unspecified: Secondary | ICD-10-CM | POA: Insufficient documentation

## 2016-05-17 DIAGNOSIS — I10 Essential (primary) hypertension: Secondary | ICD-10-CM | POA: Diagnosis not present

## 2016-05-17 DIAGNOSIS — C719 Malignant neoplasm of brain, unspecified: Secondary | ICD-10-CM

## 2016-05-17 DIAGNOSIS — Z79899 Other long term (current) drug therapy: Secondary | ICD-10-CM | POA: Insufficient documentation

## 2016-05-17 DIAGNOSIS — Z87891 Personal history of nicotine dependence: Secondary | ICD-10-CM | POA: Diagnosis not present

## 2016-05-17 DIAGNOSIS — Z7984 Long term (current) use of oral hypoglycemic drugs: Secondary | ICD-10-CM | POA: Insufficient documentation

## 2016-05-17 LAB — CBC WITH DIFFERENTIAL/PLATELET
BASOS PCT: 0 %
Basophils Absolute: 0 10*3/uL (ref 0.0–0.1)
EOS ABS: 0.1 10*3/uL (ref 0.0–0.7)
Eosinophils Relative: 1 %
HCT: 35.8 % — ABNORMAL LOW (ref 39.0–52.0)
Hemoglobin: 12.3 g/dL — ABNORMAL LOW (ref 13.0–17.0)
Lymphocytes Relative: 12 %
Lymphs Abs: 0.8 10*3/uL (ref 0.7–4.0)
MCH: 33.5 pg (ref 26.0–34.0)
MCHC: 34.4 g/dL (ref 30.0–36.0)
MCV: 97.5 fL (ref 78.0–100.0)
MONO ABS: 0.6 10*3/uL (ref 0.1–1.0)
MONOS PCT: 9 %
Neutro Abs: 5.2 10*3/uL (ref 1.7–7.7)
Neutrophils Relative %: 78 %
PLATELETS: 147 10*3/uL — AB (ref 150–400)
RBC: 3.67 MIL/uL — ABNORMAL LOW (ref 4.22–5.81)
RDW: 15 % (ref 11.5–15.5)
WBC: 6.7 10*3/uL (ref 4.0–10.5)

## 2016-05-17 LAB — COMPREHENSIVE METABOLIC PANEL
ALBUMIN: 3.8 g/dL (ref 3.5–5.0)
ALT: 22 U/L (ref 17–63)
ANION GAP: 5 (ref 5–15)
AST: 20 U/L (ref 15–41)
Alkaline Phosphatase: 34 U/L — ABNORMAL LOW (ref 38–126)
BUN: 13 mg/dL (ref 6–20)
CHLORIDE: 105 mmol/L (ref 101–111)
CO2: 28 mmol/L (ref 22–32)
Calcium: 9 mg/dL (ref 8.9–10.3)
Creatinine, Ser: 0.65 mg/dL (ref 0.61–1.24)
GFR calc Af Amer: >60 mL/min (ref >60)
GFR calc non Af Amer: >60 mL/min (ref >60)
GLUCOSE: 106 mg/dL — AB (ref 65–99)
POTASSIUM: 3.9 mmol/L (ref 3.5–5.1)
SODIUM: 138 mmol/L (ref 135–145)
TOTAL PROTEIN: 6.2 g/dL — AB (ref 6.5–8.1)
Total Bilirubin: 0.8 mg/dL (ref 0.3–1.2)

## 2016-05-17 MED ORDER — TEMOZOLOMIDE 140 MG PO CAPS: 200.0000 mg/m2/d | ORAL_CAPSULE | Freq: Every day | ORAL | 6 refills | Status: DC

## 2016-05-17 NOTE — Progress Notes (Signed)
Marland Kitchen    HEMATOLOGY/ONCOLOGY PROGRESS NOTE  Date of Service: 05/17/2016  Patient Care Team: [name], MD as PCP - General (Internal Medicine)  CHIEF COMPLAINTS/PURPOSE OF CONSULTATION:  Continued treatment of GBM.  HISTORY OF PRESENTING ILLNESS:  Phillip Mckay is a wonderful 75 y.o. male who has been referred to [place] by Dr Neijstrom/Dr Sherrie Sport for evaluation and management of GBM.  He is 75 year old gentleman who presented with  trouble utilizing his left hand as well as parking his vehicle within the designated parking space was the end of December 2016.   He was evaluated and an MRI of the brain on 10/19/2015 which showed a 4.4 x 3.8 x 4.6 cm peripheral enhancing mass centered within the right parietal lobe consistent with a primary glial neoplasm with edema and some mass effect and some associated hemorrhage. He also had a 3 mm midline shift.  Patient subsequently had a subtotal resection of about 80% of the tumor mass by Dr. Owens Shark on 10/25/2015 at [facility].  Mr. Peirson is accompanied by his wife and wearing an alternating electrical current machine on his head.   His wife notes he has been talking about buying a [name] and doing big things again, even if it is with a walker or in a wheelchair. She has taken him out to shop for antiques, which he enjoys. His wife keeps the car keys from him because she is afraid he'll try to drive.   He takes a nausea pill every morning. One day a couple of weeks ago, he needed to take two anti-nausea pills in one day.   He continues to take 2 mg dexamethasone every other day.   He is moving his bowels regularly, once a day and sometimes twice.   He denies any urinary issues. States he goes to the bathroom about 3 x nightly.  He reports a sharp pain for just a second in his stomach the other day but he figured it was just gas. He wasn't sure what he ate that might have caused it, he had eaten chicken wings, soup,  and spaghetti that day.   States he can walk around the room with a cane but if someone comes near him, he loses his balance. States he does everything he can not to fall, "I don't want to fall". If he feels he is going to fall, it is always forwards.  The patient speaks about seeing Dr. Lisbeth Renshaw of radiation oncology the other day and being told he was doing well.    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 In his wife have 2 sons and they adopted one daughter Phillip Mckay is with him today.  #4 history of hypertension #5 history of dyslipidemia  SURGICAL HISTORY:   10/25/2015 Surgery  Subtotal resection Dr. Owens Shark   SOCIAL HISTORY: Social History   Social History  . Marital status: Married    Spouse name: N/A  . Number of children: N/A  . Years of education: N/A   Occupational History  . Not on file.   Social History Main Topics  . Smoking status: Former Research scientist (life sciences)  . Smokeless tobacco: Not on file  . Alcohol use No  . Drug use: No  . Sexual activity: No   Other Topics Concern  . Not on file  Social History Narrative  . No narrative on file  former smoker quit at age 23 yo Worked as a Pharmacist, community for about 40 years.  Married 54 years, 23 in September 2017 2 sons and 1 daughter  He notes that he did a lot of driving and "heavy work" in his career. He also used to get up every morning at 5 in the morning and walk 3 miles, every day.  FAMILY HISTORY: Family History  Problem Relation Age of Onset  . Cancer Mother   . Diabetes Mother   . Cancer Father   . Cancer Brother     ALLERGIES:  has No Known Allergies.  MEDICATIONS:  Current Outpatient Prescriptions  Medication Sig Dispense Refill  . b complex vitamins tablet Take 1 tablet by mouth daily.    . B-D UF III MINI PEN NEEDLES 31G X 5 MM MISC     .  B-D ULTRA-FINE 33 LANCETS MISC by Does not apply route.    Marland Kitchen dexamethasone (DECADRON) 4 MG tablet Reduce to 4mg  po daily x 7 days then 4mg  alternating with 2mg  every other day x 10 days then 2mg  po daily till next appointment 60 tablet 0  . Docusate Sodium 100 MG capsule Take by mouth.    . fluconazole (DIFLUCAN) 100 MG tablet Reported on 04/20/2016    . gabapentin (NEURONTIN) 300 MG capsule Reported on 04/20/2016    . glucose blood (FREESTYLE INSULINX TEST) test strip     . Glycerin-Hypromellose-PEG 400 0.2-0.2-1 % SOLN Apply to eye. Reported on 04/20/2016    . insulin aspart (NOVOLOG) 100 UNIT/ML injection Inject into the skin.    Marland Kitchen latanoprost (XALATAN) 0.005 % ophthalmic solution Apply to eye. Reported on 04/20/2016    . lisinopril (PRINIVIL,ZESTRIL) 40 MG tablet     . LORazepam (ATIVAN) 1 MG tablet Take as needed for seizure. Take 1 mg at onset may repeat in 5 minute intervals X 3 30 tablet 3  . metFORMIN (GLUCOPHAGE) 1000 MG tablet Take by mouth.    Marland Kitchen omeprazole (PRILOSEC) 20 MG capsule Take 20 mg by mouth daily.    . ondansetron (ZOFRAN) 8 MG tablet Take by mouth.    . potassium chloride SA (K-DUR,KLOR-CON) 20 MEQ tablet Take 1 tablet (20 mEq total) by mouth daily. 30 tablet 1  . Probiotic Product [facility]) CAPS Take by mouth.    . prochlorperazine (COMPAZINE) 10 MG tablet Reported on 04/20/2016    . simvastatin (ZOCOR) 20 MG tablet Reported on 04/20/2016    . tamsulosin (FLOMAX) 0.4 MG CAPS capsule Take by mouth.    . tetrahydrozoline 0.05 % ophthalmic solution Reported on 04/20/2016    . traMADol (ULTRAM) 50 MG tablet Take 1 tablet (50 mg total) by mouth every 6 (six) hours as needed. 60 tablet 2  . travoprost, benzalkonium, (TRAVATAN) 0.004 % ophthalmic solution 1 drop at bedtime.    . Vitamin D, Ergocalciferol, (DRISDOL) 50000 units CAPS capsule Take by mouth.     No current facility-administered medications for this visit.     REVIEW OF SYSTEMS:   14 point review of systems was performed  and is negative except as detailed under history of present illness and above    PHYSICAL EXAMINATION: ECOG PERFORMANCE STATUS: 2 - Symptomatic, <50% confined to bed  . Vitals:   05/17/16 0857  BP: 137/72  Pulse: 80  Resp: 20  Temp: 97.8 F (36.6 C)   Filed Weights   05/17/16 0857  Weight: 183 lb 1.6 oz (  83.1 kg)   .Body mass index is 28.68 kg/m.  GENERAL:alert, in no acute distress and comfortable, has wires from the alternating electrical currents machine on his head. Able to get on exam table with assistance SKIN: skin color, texture, turgor are normal, no rashes or significant lesions EYES: normal, conjunctiva are pink and non-injected, sclera clear OROPHARYNX:no exudate, no erythema and lips, buccal mucosa, and tongue normal  NECK: supple, no JVD, thyroid normal size, non-tender, without nodularity LYMPH:  no palpable lymphadenopathy in the cervical, axillary or inguinal LUNGS: clear to auscultation with normal respiratory effort HEART: regular rate & rhythm,  no murmurs and no lower extremity edema ABDOMEN: abdomen soft, non-tender, normoactive bowel sounds  Musculoskeletal: no cyanosis of digits and no clubbing  PSYCH: alert & oriented x 3 with fluent speech NEURO: Moving all 4 extremities, some clumsiness in his left upper extremity  LABORATORY DATA:  I have reviewed the data as listed  . CBC Latest Ref Rng & Units 05/17/2016 05/04/2016 04/27/2016  WBC 4.0 - 10.5 K/uL 6.7 5.5 5.2  Hemoglobin 13.0 - 17.0 g/dL 12.3(L) 12.9(L) 13.1  Hematocrit 39.0 - 52.0 % 35.8(L) 37.4(L) 38.2(L)  Platelets 150 - 400 K/uL 147(L) 122(L) 88(L)   . CMP Latest Ref Rng & Units 05/17/2016 05/04/2016 04/27/2016  Glucose 65 - 99 mg/dL 106(H) 177(H) 102(H)  BUN 6 - 20 mg/dL 13 11 12   Creatinine 0.61 - 1.24 mg/dL 0.65 0.70 0.63  Sodium 135 - 145 mmol/L 138 138 139  Potassium 3.5 - 5.1 mmol/L 3.9 3.9 4.0  Chloride 101 - 111 mmol/L 105 107 105  CO2 22 - 32 mmol/L 28 21(L) 28  Calcium 8.9 -  10.3 mg/dL 9.0 9.4 9.1  Total Protein 6.5 - 8.1 g/dL 6.2(L) 6.0(L) 6.2(L)  Total Bilirubin 0.3 - 1.2 mg/dL 0.8 0.7 0.9  Alkaline Phos 38 - 126 U/L 34(L) 32(L) 32(L)  AST 15 - 41 U/L 20 22 20   ALT 17 - 63 U/L 22 23 22      Results Final Diagnosis  Brain, right parietal, biopsies (specimens 1-2): - Glioblastoma, WHO grade 4; see comment.  Electronically signed by Golden Pop, MD on 10/26/2015 at [facility]: I have personally reviewed the radiological images as listed and agreed with the findings in the report. No results found. MRI IMPRESSION: 4.4 x 3.8 x 4.6 CM peripherally enhancing mass centered within the right parietal lobe which is favored to be a primary glial neoplasm. Though this also may reflect a metastatic lesion, this is considered less likely.  Areas of surrounding susceptibility and layering susceptibility the dependent portion reflective of associated hemorrhage. Surrounding T2/FLAIR hyperintense signal reflective of edema with mass effect and slight effacement of the sulci and right  ventricle and up to 3 mm of midline shift. Areas of diffusion restriction medial to the mass, reflective of possibly related to ischemia versus lesion cellularity.   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 Completed concurrent temozolomide and radiation therapy. Received his first cycle of adjuvant temozolomide on 02/21/2016. Patient was due for his second cycle of temozolomide on 03/19/2016 but was postponed due to thrombocytopenia with platelets of 80k -Given his thrombocytopenia his dose for second cycle of temozolomide was maintained at 150mg /m2  Labs reviewed. Platelets 147.  Results for REHMAN, GROESBECK (MRN [id number]) as of 05/17/2016 09:05  Ref.  Range 04/02/2016 14:36 04/16/2016 09:31 04/27/2016  07:53 05/04/2016 15:47 05/17/2016 08:23  Platelets Latest Ref Range: 150 - 400 K/uL 86 (L) 80 (L) 88 (L) 122 (L) 147 (L)    I have written him a new Temodar prescription.   He was given steroid taper instructions.   I spoke with the patient about no longer being able to drive with his glioblastoma. His wife notes they argue about this at times.   Advised for the patient to use a walker and avoid falls.  He is due to start his next Cycle of Temodar on 05/31/16.  He will return for follow up on 05/25/16, Day 22. At this next visit, a CBC and CMP will be performed.  Orders Placed This Encounter  Procedures  . CBC with Differential    Standing Status:   Future    Number of Occurrences:   1    Standing Expiration Date:   05/17/2017  . Comprehensive metabolic panel    Standing Status:   Future    Number of Occurrences:   1    Standing Expiration Date:   05/17/2017    This document serves as a record of services personally performed by Ancil Linsey, MD. It was created on her behalf by Arlyce Harman, a trained medical scribe. The creation of this record is based on the scribe's personal observations and the provider's statements to them. This document has been checked and approved by the attending provider.  I have reviewed the above documentation for accuracy and completeness and I agree with the above.  Molli Hazard, MD   05/17/2016 9:05 AM

## 2016-05-17 NOTE — Patient Instructions (Signed)
[place] at [facility] Discharge Instructions  RECOMMENDATIONS MADE BY THE CONSULTANT AND ANY TEST RESULTS WILL BE SENT TO YOUR REFERRING PHYSICIAN.  You were seen by Dr. Whitney Muse today. Labs on 8/11 Follow up with Tom on 8/17 with chemo Take steroids every 3 days until medicine is gone. Please call clinic with any related concerns.  Thank you for choosing [place] at [facility] to provide your oncology and hematology care.  To afford each patient quality time with our provider, please arrive at least 15 minutes before your scheduled appointment time.   Beginning January 23rd 2017 lab work for the [facility] will be done in the  Main lab at [facility] on 1st floor. If you have a lab appointment with the [facility] please come in thru the  Main Entrance and check in at the main information desk  You need to re-schedule your appointment should you arrive 10 or more minutes late.  We strive to give you quality time with our providers, and arriving late affects you and other patients whose appointments are after yours.  Also, if you no show three or more times for appointments you may be dismissed from the clinic at the providers discretion.     Again, thank you for choosing [facility].  Our hope is that these requests will decrease the amount of time that you wait before being seen by our physicians.       _____________________________________________________________  Should you have questions after your visit to [facility], please contact our office at [phone] between the hours of 8:30 a.m. and 4:30 p.m.  Voicemails left after 4:30 p.m. will not be returned until the following business day.  For prescription refill requests, have your pharmacy contact our office.         Resources For Cancer Patients and their Caregivers ? American Cancer Society: Can assist with transportation, wigs, general  needs, runs Look Good Feel Better.        317-347-7587 ? Cancer Care: Provides financial assistance, online support groups, medication/co-pay assistance.  1-800-813-HOPE 309-033-7497) ? [place] Assists [place] cancer patients and their families through emotional , educational and financial support.  5151473518 ? [place] DSS Where to apply for food stamps, Medicaid and utility assistance. 3161403078 ? RCATS: Transportation to medical appointments. 561 887 3148 ? Social Security Administration: May apply for disability if have a Stage IV cancer. 920-500-5565 [phone] [phone] ? [employer], Disability and Transit Services: Assists with nutrition, care and transit needs. [place] Support Programs: @10RELATIVEDAYS @ > Cancer Support Group  2nd Tuesday of the month 1pm-2pm, Journey Room  > Creative Journey  3rd Tuesday of the month 1130am-1pm, Journey Room  > Look Good Feel Better  1st Wednesday of the month 10am-12 noon, Journey Room (Call [place] to register [phone])

## 2016-05-18 ENCOUNTER — Telehealth (HOSPITAL_COMMUNITY): Payer: Self-pay | Admitting: Hematology & Oncology

## 2016-05-24 ENCOUNTER — Other Ambulatory Visit (HOSPITAL_COMMUNITY): Payer: Self-pay | Admitting: Emergency Medicine

## 2016-05-24 MED ORDER — PROCHLORPERAZINE MALEATE 10 MG PO TABS: 10.0000 mg | ORAL_TABLET | Freq: Four times a day (QID) | ORAL | 2 refills | Status: AC | PRN

## 2016-05-24 MED ORDER — B COMPLEX PO TABS: 1.0000 | ORAL_TABLET | Freq: Every day | ORAL | 2 refills | Status: DC

## 2016-05-24 NOTE — Progress Notes (Unsigned)
Pts wife called and stated that he was nauseated.  Pt has only been taking zofran.  Spoke with Kirby Crigler PA.  Compazine escibed to the pharmacy, pts wife told to alternate these around the clock.  If these do not help then she can add a half a tablet of ativan and call me tomorrow and I will given her a new prescription for the ativan.  Pts wife verbalized understanding.

## 2016-05-25 ENCOUNTER — Encounter (HOSPITAL_COMMUNITY): Payer: Medicare Other

## 2016-05-25 DIAGNOSIS — C719 Malignant neoplasm of brain, unspecified: Secondary | ICD-10-CM

## 2016-05-25 LAB — COMPREHENSIVE METABOLIC PANEL
ALK PHOS: 36 U/L — AB (ref 38–126)
ALT: 22 U/L (ref 17–63)
AST: 23 U/L (ref 15–41)
Albumin: 3.7 g/dL (ref 3.5–5.0)
Anion gap: 7 (ref 5–15)
BILIRUBIN TOTAL: 1.1 mg/dL (ref 0.3–1.2)
BUN: 11 mg/dL (ref 6–20)
CALCIUM: 9.3 mg/dL (ref 8.9–10.3)
CO2: 24 mmol/L (ref 22–32)
CREATININE: 0.89 mg/dL (ref 0.61–1.24)
Chloride: 107 mmol/L (ref 101–111)
GFR calc Af Amer: >60 mL/min (ref >60)
GLUCOSE: 103 mg/dL — AB (ref 65–99)
Potassium: 3.7 mmol/L (ref 3.5–5.1)
SODIUM: 138 mmol/L (ref 135–145)
TOTAL PROTEIN: 6.5 g/dL (ref 6.5–8.1)

## 2016-05-25 LAB — CBC WITH DIFFERENTIAL/PLATELET
BASOS ABS: 0 10*3/uL (ref 0.0–0.1)
Basophils Relative: 0 %
EOS PCT: 2 %
Eosinophils Absolute: 0.1 10*3/uL (ref 0.0–0.7)
HCT: 35.2 % — ABNORMAL LOW (ref 39.0–52.0)
HEMOGLOBIN: 12.2 g/dL — AB (ref 13.0–17.0)
LYMPHS ABS: 0.7 10*3/uL (ref 0.7–4.0)
LYMPHS PCT: 10 %
MCH: 33.3 pg (ref 26.0–34.0)
MCHC: 34.7 g/dL (ref 30.0–36.0)
MCV: 96.2 fL (ref 78.0–100.0)
Monocytes Absolute: 0.8 10*3/uL (ref 0.1–1.0)
Monocytes Relative: 11 %
NEUTROS PCT: 77 %
Neutro Abs: 5.1 10*3/uL (ref 1.7–7.7)
PLATELETS: 156 10*3/uL (ref 150–400)
RBC: 3.66 MIL/uL — AB (ref 4.22–5.81)
RDW: 14.5 % (ref 11.5–15.5)
WBC: 6.7 10*3/uL (ref 4.0–10.5)

## 2016-05-26 ENCOUNTER — Encounter (HOSPITAL_COMMUNITY): Payer: Self-pay | Admitting: Hematology & Oncology

## 2016-05-28 ENCOUNTER — Telehealth (HOSPITAL_COMMUNITY): Payer: Self-pay

## 2016-05-28 NOTE — Telephone Encounter (Signed)
Wife called stating patient has had diarrhea for 2 days and he is not eating much. Wife has not given patient any medication for the diarrhea. She wanted to check with [place] first. He also finished his Temodar on Friday 8/11. After reviewing with PA, instructed wife to give patient Imodium per protocol. She verbalized understanding and will call with update. Also instructed her to be sure patient drank fluids so he would not get dehydrated.

## 2016-05-31 ENCOUNTER — Encounter (HOSPITAL_COMMUNITY): Payer: Self-pay | Admitting: Hematology & Oncology

## 2016-05-31 ENCOUNTER — Encounter (HOSPITAL_BASED_OUTPATIENT_CLINIC_OR_DEPARTMENT_OTHER): Payer: Medicare Other | Admitting: Oncology

## 2016-05-31 ENCOUNTER — Encounter (HOSPITAL_COMMUNITY): Payer: Medicare Other

## 2016-05-31 ENCOUNTER — Encounter (HOSPITAL_COMMUNITY): Payer: Self-pay | Admitting: Oncology

## 2016-05-31 VITALS — BP 115/81 | HR 104 | Temp 98.1°F | Resp 18

## 2016-05-31 DIAGNOSIS — E46 Unspecified protein-calorie malnutrition: Secondary | ICD-10-CM

## 2016-05-31 DIAGNOSIS — E860 Dehydration: Secondary | ICD-10-CM

## 2016-05-31 DIAGNOSIS — C713 Malignant neoplasm of parietal lobe: Secondary | ICD-10-CM

## 2016-05-31 DIAGNOSIS — C719 Malignant neoplasm of brain, unspecified: Secondary | ICD-10-CM | POA: Diagnosis not present

## 2016-05-31 LAB — CBC WITH DIFFERENTIAL/PLATELET
Basophils Absolute: 0 10*3/uL (ref 0.0–0.1)
Basophils Relative: 1 %
EOS ABS: 0.2 10*3/uL (ref 0.0–0.7)
EOS PCT: 3 %
HCT: 35.9 % — ABNORMAL LOW (ref 39.0–52.0)
Hemoglobin: 12.1 g/dL — ABNORMAL LOW (ref 13.0–17.0)
LYMPHS ABS: 0.7 10*3/uL (ref 0.7–4.0)
LYMPHS PCT: 11 %
MCH: 33.1 pg (ref 26.0–34.0)
MCHC: 33.7 g/dL (ref 30.0–36.0)
MCV: 98.1 fL (ref 78.0–100.0)
MONOS PCT: 9 %
Monocytes Absolute: 0.6 10*3/uL (ref 0.1–1.0)
Neutro Abs: 4.9 10*3/uL (ref 1.7–7.7)
Neutrophils Relative %: 76 %
PLATELETS: 189 10*3/uL (ref 150–400)
RBC: 3.66 MIL/uL — AB (ref 4.22–5.81)
RDW: 15.1 % (ref 11.5–15.5)
WBC: 6.3 10*3/uL (ref 4.0–10.5)

## 2016-05-31 LAB — COMPREHENSIVE METABOLIC PANEL
ALT: 38 U/L (ref 17–63)
ANION GAP: 7 (ref 5–15)
AST: 31 U/L (ref 15–41)
Albumin: 3.7 g/dL (ref 3.5–5.0)
Alkaline Phosphatase: 35 U/L — ABNORMAL LOW (ref 38–126)
BUN: 13 mg/dL (ref 6–20)
CHLORIDE: 107 mmol/L (ref 101–111)
CO2: 24 mmol/L (ref 22–32)
CREATININE: 0.88 mg/dL (ref 0.61–1.24)
Calcium: 9.4 mg/dL (ref 8.9–10.3)
Glucose, Bld: 102 mg/dL — ABNORMAL HIGH (ref 65–99)
Potassium: 4.1 mmol/L (ref 3.5–5.1)
SODIUM: 138 mmol/L (ref 135–145)
Total Bilirubin: 1 mg/dL (ref 0.3–1.2)
Total Protein: 6.2 g/dL — ABNORMAL LOW (ref 6.5–8.1)

## 2016-05-31 MED ORDER — POTASSIUM CHLORIDE 2 MEQ/ML IV SOLN
INTRAVENOUS | Status: DC
  Administered 2016-05-31: 12:00:00 via INTRAVENOUS
  Filled 2016-05-31 (×10): qty 1000

## 2016-05-31 MED ORDER — SODIUM CHLORIDE 0.9 % IV SOLN: Freq: Once | INTRAVENOUS | Status: DC

## 2016-05-31 NOTE — Patient Instructions (Signed)
[place] at [facility] Discharge Instructions  RECOMMENDATIONS MADE BY THE CONSULTANT AND ANY TEST RESULTS WILL BE SENT TO YOUR REFERRING PHYSICIAN.  LABS TODAY. IV FLUIDS TODAY. FOLLOW UP IN 2 WEEKS TO SEE DR. Whitney Muse   Thank you for choosing [place] at [facility] to provide your oncology and hematology care.  To afford each patient quality time with our provider, please arrive at least 15 minutes before your scheduled appointment time.   Beginning January 23rd 2017 lab work for the [facility] will be done in the  Main lab at [facility] on 1st floor. If you have a lab appointment with the [facility] please come in thru the  Main Entrance and check in at the main information desk  You need to re-schedule your appointment should you arrive 10 or more minutes late.  We strive to give you quality time with our providers, and arriving late affects you and other patients whose appointments are after yours.  Also, if you no show three or more times for appointments you may be dismissed from the clinic at the providers discretion.     Again, thank you for choosing [facility].  Our hope is that these requests will decrease the amount of time that you wait before being seen by our physicians.       _____________________________________________________________  Should you have questions after your visit to [facility], please contact our office at [phone] between the hours of 8:30 a.m. and 4:30 p.m.  Voicemails left after 4:30 p.m. will not be returned until the following business day.  For prescription refill requests, have your pharmacy contact our office.         Resources For Cancer Patients and their Caregivers ? American Cancer Society: Can assist with transportation, wigs, general needs, runs Look Good Feel Better.        6800453610 ? Cancer Care: Provides financial assistance, online support  groups, medication/co-pay assistance.  1-800-813-HOPE 978-843-4503) ? [place] Assists [place] cancer patients and their families through emotional , educational and financial support.  319-274-0054 ? [place] DSS Where to apply for food stamps, Medicaid and utility assistance. (848) 429-0877 ? RCATS: Transportation to medical appointments. (417)289-2439 ? Social Security Administration: May apply for disability if have a Stage IV cancer. 407-540-5170 [phone]) 265-3608 ? [employer], Disability and Transit Services: Assists with nutrition, care and transit needs. [place] Support Programs: @10RELATIVEDAYS @ > Cancer Support Group  2nd Tuesday of the month 1pm-2pm, Journey Room  > Creative Journey  3rd Tuesday of the month 1130am-1pm, Journey Room  > Look Good Feel Better  1st Wednesday of the month 10am-12 noon, Journey Room (Call [place] to register [phone])

## 2016-05-31 NOTE — Patient Instructions (Signed)
[place] at [facility] Discharge Instructions  RECOMMENDATIONS MADE BY THE CONSULTANT AND ANY TEST RESULTS WILL BE SENT TO YOUR REFERRING PHYSICIAN.  IV fluids today (one liter or normal saline with 30 mEq of potassium). Return as scheduled for lab work and office visit.   Thank you for choosing [place] at [facility] to provide your oncology and hematology care.  To afford each patient quality time with our provider, please arrive at least 15 minutes before your scheduled appointment time.   Beginning January 23rd 2017 lab work for the [facility] will be done in the  Main lab at [facility] on 1st floor. If you have a lab appointment with the [facility] please come in thru the  Main Entrance and check in at the main information desk  You need to re-schedule your appointment should you arrive 10 or more minutes late.  We strive to give you quality time with our providers, and arriving late affects you and other patients whose appointments are after yours.  Also, if you no show three or more times for appointments you may be dismissed from the clinic at the providers discretion.     Again, thank you for choosing [facility].  Our hope is that these requests will decrease the amount of time that you wait before being seen by our physicians.       _____________________________________________________________  Should you have questions after your visit to [facility], please contact our office at [phone] between the hours of 8:30 a.m. and 4:30 p.m.  Voicemails left after 4:30 p.m. will not be returned until the following business day.  For prescription refill requests, have your pharmacy contact our office.         Resources For Cancer Patients and their Caregivers ? American Cancer Society: Can assist with transportation, wigs, general needs, runs Look Good Feel Better.        978-840-6438 ? Cancer  Care: Provides financial assistance, online support groups, medication/co-pay assistance.  1-800-813-HOPE 416-697-5203) ? [place] Assists [place] cancer patients and their families through emotional , educational and financial support.  267-421-8494 ? [place] DSS Where to apply for food stamps, Medicaid and utility assistance. 972-883-6218 ? RCATS: Transportation to medical appointments. 5870774079 ? Social Security Administration: May apply for disability if have a Stage IV cancer. 843-509-3441 [phone]) 026-6620 ? [employer], Disability and Transit Services: Assists with nutrition, care and transit needs. [place] Support Programs: @10RELATIVEDAYS @ > Cancer Support Group  2nd Tuesday of the month 1pm-2pm, Journey Room  > Creative Journey  3rd Tuesday of the month 1130am-1pm, Journey Room  > Look Good Feel Better  1st Wednesday of the month 10am-12 noon, Journey Room (Call [place] to register [phone])

## 2016-05-31 NOTE — Assessment & Plan Note (Addendum)
Right parietal lobe Glioblastoma Multiforme, grade 4, unknown methylation status, S/P partial resection on 10/25/2015 with removal of 80% of tumor followed by XRT.  He started his first cycle of adjuvant temozolomide on 02/21/2016.  Future cycles werre complicated by Keppra-induced thrombocytopenia (without documented seizure activity) and therefore, Keppra was discontinued on 04/27/2016 with significant improvement in platelet count.  Labs today: CBC diff, CMET.  I personally reviewed and went over laboratory results with the patient.  The results are noted within this dictation.  Given his poor oral intake, it is reasonable to get him set-up for IVF.  Orders are placed for NS 1 L at 333cc/hr.  Labs in 2 and 4 weeks: CBC diff, CMET.   Return in 2 weeks for follow-up.

## 2016-05-31 NOTE — Progress Notes (Signed)
Phillip Mckay, [place] [id number]  Glioblastoma multiforme of brain [facility]) - Plan: CBC with Differential, Comprehensive metabolic panel, CBC with Differential, Comprehensive metabolic panel, CBC with Differential, Comprehensive metabolic panel, 0.9 %  sodium chloride infusion, CBC with Differential, Comprehensive metabolic panel  Dehydration - Plan: CBC with Differential, Comprehensive metabolic panel, CBC with Differential, Comprehensive metabolic panel, CBC with Differential, Comprehensive metabolic panel, 0.9 %  sodium chloride infusion, CBC with Differential, Comprehensive metabolic panel, DISCONTINUED: sodium chloride 0.9 % 1,000 mL with potassium chloride 30 mEq infusion  CURRENT THERAPY: Temozolomide 420 mg days 1-5 every 21 days.  Cycle 3 started on 05/21/2016.  INTERVAL HISTORY: Phillip Mckay 75 y.o. male returns for followup of Right parietal lobe Glioblastoma Multiforme, grade 4, unknown methylation status, S/P partial resection on 10/25/2015 with removal of 80% of tumor followed by XRT.  He started his first cycle of adjuvant temozolomide on 02/21/2016.  Future cycles were complicated by Keppra-induced thrombocytopenia (without documented seizure activity) and therefore, Keppra was discontinued on 04/27/2016 with significant improvement in platelet count.  The patient reports that he is doing well.  His wife, who comes with him today reports a different story.  She notes that prior to beginning his Temodar, he became sick with nausea and vomiting.  That has since resolved.  She hypothesizes that he ascertained a "bug" while visiting his primary care office for which he was there for 3 hours, she notes.  Since then, his appetite and oral intake has been low.  She reports that he is not drinking well.  He is also weak.  Review of Systems  Constitutional: Positive for malaise/fatigue. Negative for chills and fever.  HENT: Negative.   Eyes:  Negative.   Respiratory: Negative.  Negative for cough.   Cardiovascular: Negative.   Gastrointestinal: Negative.  Negative for constipation, diarrhea, nausea and vomiting.  Genitourinary: Negative.  Negative for dysuria.  Musculoskeletal: Negative.   Skin: Negative.   Neurological: Positive for weakness. Negative for headaches.  Endo/Heme/Allergies: Negative.   Psychiatric/Behavioral: Negative.     Past Medical History:  Diagnosis Date  . Brain cancer ([place])   . Diabetes mellitus without complication ([place])   . Glioblastoma multiforme of brain ([place]) 10/27/2015    Past Surgical History:  Procedure Laterality Date  . CHOLECYSTECTOMY      Family History  Problem Relation Age of Onset  . Cancer Mother   . Diabetes Mother   . Cancer Father   . Cancer Brother     Social History   Social History  . Marital status: Married    Spouse name: N/A  . Number of children: N/A  . Years of education: N/A   Social History Main Topics  . Smoking status: Former Research scientist (life sciences)  . Smokeless tobacco: None  . Alcohol use No  . Drug use: No  . Sexual activity: No   Other Topics Concern  . None   Social History Narrative  . None     PHYSICAL EXAMINATION  ECOG PERFORMANCE STATUS: 2 - Symptomatic, <50% confined to bed  Vitals:   05/31/16 1014  BP: 115/81  Pulse: (!) 104  Resp: 18  Temp: 98.1 F (36.7 C)    GENERAL:alert, well nourished, well developed, comfortable, cooperative, smiling and accompanied by his wife. SKIN: skin color, texture, turgor are normal, no rashes or significant lesions EYES: normal, Conjunctiva are pink and non-injected EARS: External ears normal OROPHARYNX:lips, buccal mucosa, and tongue  normal  NECK: supple, trachea midline LYMPH:  no palpable lymphadenopathy BREAST:not examined LUNGS: clear to auscultation  HEART: regular rate & rhythm ABDOMEN:abdomen soft and normal bowel sounds BACK: Back symmetric, no curvature. EXTREMITIES:less then 2 second  capillary refill, no skin discoloration, no cyanosis  NEURO: alert & oriented x 3 with fluent speech   LABORATORY DATA: CBC    Component Value Date/Time   WBC 6.3 05/31/2016 1230   RBC 3.66 (L) 05/31/2016 1230   HGB 12.1 (L) 05/31/2016 1230   HCT 35.9 (L) 05/31/2016 1230   PLT 189 05/31/2016 1230   MCV 98.1 05/31/2016 1230   MCH 33.1 05/31/2016 1230   MCHC 33.7 05/31/2016 1230   RDW 15.1 05/31/2016 1230   LYMPHSABS 0.7 05/31/2016 1230   MONOABS 0.6 05/31/2016 1230   EOSABS 0.2 05/31/2016 1230   BASOSABS 0.0 05/31/2016 1230      Chemistry      Component Value Date/Time   NA 138 05/31/2016 1230   K 4.1 05/31/2016 1230   CL 107 05/31/2016 1230   CO2 24 05/31/2016 1230   BUN 13 05/31/2016 1230   CREATININE 0.88 05/31/2016 1230      Component Value Date/Time   CALCIUM 9.4 05/31/2016 1230   ALKPHOS 35 (L) 05/31/2016 1230   AST 31 05/31/2016 1230   ALT 38 05/31/2016 1230   BILITOT 1.0 05/31/2016 1230        PENDING LABS:   RADIOGRAPHIC STUDIES:  No results found.   PATHOLOGY:    ASSESSMENT AND PLAN:  Glioblastoma multiforme of brain (HCC) Right parietal lobe Glioblastoma Multiforme, grade 4, unknown methylation status, S/P partial resection on 10/25/2015 with removal of 80% of tumor followed by XRT.  He started his first cycle of adjuvant temozolomide on 02/21/2016.  Future cycles werre complicated by Keppra-induced thrombocytopenia (without documented seizure activity) and therefore, Keppra was discontinued on 04/27/2016 with significant improvement in platelet count.  Labs today: CBC diff, CMET.  I personally reviewed and went over laboratory results with the patient.  The results are noted within this dictation.  Given his poor oral intake, it is reasonable to get him set-up for IVF.  Orders are placed for NS 1 L at 333cc/hr.  Labs in 2 and 4 weeks: CBC diff, CMET.   Return in 2 weeks for follow-up.    ORDERS PLACED FOR THIS ENCOUNTER: Orders Placed This  Encounter  Procedures  . CBC with Differential  . Comprehensive metabolic panel  . CBC with Differential  . Comprehensive metabolic panel  . CBC with Differential  . Comprehensive metabolic panel    MEDICATIONS PRESCRIBED THIS ENCOUNTER: Meds ordered this encounter  Medications  . 0.9 %  sodium chloride infusion    THERAPY PLAN:  Continue treatment as outlined above.  All questions were answered. The patient knows to call the clinic with any problems, questions or concerns. We can certainly see the patient much sooner if necessary.  Patient and plan discussed with Dr. Ancil Linsey and she is in agreement with the aforementioned.   This note is electronically signed by: Doy Mince 05/31/2016 5:50 PM

## 2016-05-31 NOTE — Progress Notes (Signed)
Tolerated infusion w/o adverse reaction.  Alert, in no distress.  VSS.  Discharged via wheelchair in c/o spouse.  

## 2016-06-14 ENCOUNTER — Encounter (HOSPITAL_COMMUNITY): Payer: Self-pay | Admitting: Hematology & Oncology

## 2016-06-14 ENCOUNTER — Encounter (HOSPITAL_COMMUNITY): Payer: Medicare Other

## 2016-06-14 ENCOUNTER — Encounter (HOSPITAL_BASED_OUTPATIENT_CLINIC_OR_DEPARTMENT_OTHER): Payer: Medicare Other | Admitting: Hematology & Oncology

## 2016-06-14 VITALS — BP 106/64 | HR 87 | Temp 98.1°F | Resp 16 | Wt 173.1 lb

## 2016-06-14 DIAGNOSIS — R634 Abnormal weight loss: Secondary | ICD-10-CM

## 2016-06-14 DIAGNOSIS — D6959 Other secondary thrombocytopenia: Secondary | ICD-10-CM

## 2016-06-14 DIAGNOSIS — C719 Malignant neoplasm of brain, unspecified: Secondary | ICD-10-CM | POA: Diagnosis not present

## 2016-06-14 DIAGNOSIS — E860 Dehydration: Secondary | ICD-10-CM

## 2016-06-14 DIAGNOSIS — C713 Malignant neoplasm of parietal lobe: Secondary | ICD-10-CM

## 2016-06-14 DIAGNOSIS — R110 Nausea: Secondary | ICD-10-CM | POA: Diagnosis not present

## 2016-06-14 LAB — CBC WITH DIFFERENTIAL/PLATELET
BASOS ABS: 0 10*3/uL (ref 0.0–0.1)
Basophils Relative: 0 %
EOS ABS: 0.1 10*3/uL (ref 0.0–0.7)
EOS PCT: 3 %
HCT: 33.9 % — ABNORMAL LOW (ref 39.0–52.0)
Hemoglobin: 11.4 g/dL — ABNORMAL LOW (ref 13.0–17.0)
LYMPHS ABS: 0.7 10*3/uL (ref 0.7–4.0)
LYMPHS PCT: 16 %
MCH: 33.1 pg (ref 26.0–34.0)
MCHC: 33.6 g/dL (ref 30.0–36.0)
MCV: 98.5 fL (ref 78.0–100.0)
MONO ABS: 0.6 10*3/uL (ref 0.1–1.0)
Monocytes Relative: 13 %
Neutro Abs: 3.2 10*3/uL (ref 1.7–7.7)
Neutrophils Relative %: 69 %
PLATELETS: 105 10*3/uL — AB (ref 150–400)
RBC: 3.44 MIL/uL — AB (ref 4.22–5.81)
RDW: 14.6 % (ref 11.5–15.5)
WBC: 4.7 10*3/uL (ref 4.0–10.5)

## 2016-06-14 LAB — COMPREHENSIVE METABOLIC PANEL
ALT: 25 U/L (ref 17–63)
AST: 29 U/L (ref 15–41)
Albumin: 3.9 g/dL (ref 3.5–5.0)
Alkaline Phosphatase: 33 U/L — ABNORMAL LOW (ref 38–126)
Anion gap: 7 (ref 5–15)
BUN: 12 mg/dL (ref 6–20)
CHLORIDE: 107 mmol/L (ref 101–111)
CO2: 25 mmol/L (ref 22–32)
Calcium: 10 mg/dL (ref 8.9–10.3)
Creatinine, Ser: 1.09 mg/dL (ref 0.61–1.24)
Glucose, Bld: 98 mg/dL (ref 65–99)
POTASSIUM: 4.3 mmol/L (ref 3.5–5.1)
SODIUM: 139 mmol/L (ref 135–145)
Total Bilirubin: 1 mg/dL (ref 0.3–1.2)
Total Protein: 6.2 g/dL — ABNORMAL LOW (ref 6.5–8.1)

## 2016-06-14 MED ORDER — OMEPRAZOLE 20 MG PO CPDR: 20.0000 mg | DELAYED_RELEASE_CAPSULE | Freq: Two times a day (BID) | ORAL | 2 refills | Status: DC

## 2016-06-14 NOTE — Patient Instructions (Addendum)
[place] at [facility] Discharge Instructions  RECOMMENDATIONS MADE BY THE CONSULTANT AND ANY TEST RESULTS WILL BE SENT TO YOUR REFERRING PHYSICIAN.  You saw Dr. Whitney Muse today. Hold Temodar- Do not give at this time. Increase Prilosec to 1 pill twice a day. Follow up next week with lab work. If nausea is not better next week, call and let us know.  Thank you for choosing [place] at [facility] to provide your oncology and hematology care.  To afford each patient quality time with our provider, please arrive at least 15 minutes before your scheduled appointment time.   Beginning January 23rd 2017 lab work for the [facility] will be done in the  Main lab at [facility] on 1st floor. If you have a lab appointment with the [facility] please come in thru the  Main Entrance and check in at the main information desk  You need to re-schedule your appointment should you arrive 10 or more minutes late.  We strive to give you quality time with our providers, and arriving late affects you and other patients whose appointments are after yours.  Also, if you no show three or more times for appointments you may be dismissed from the clinic at the providers discretion.     Again, thank you for choosing [facility].  Our hope is that these requests will decrease the amount of time that you wait before being seen by our physicians.       _____________________________________________________________  Should you have questions after your visit to [facility], please contact our office at [phone] between the hours of 8:30 a.m. and 4:30 p.m.  Voicemails left after 4:30 p.m. will not be returned until the following business day.  For prescription refill requests, have your pharmacy contact our office.         Resources For Cancer Patients and their Caregivers ? American Cancer Society: Can assist with transportation,  wigs, general needs, runs Look Good Feel Better.        352-874-9953 ? Cancer Care: Provides financial assistance, online support groups, medication/co-pay assistance.  1-800-813-HOPE (863)684-2937) ? [facility] Assists [place] cancer patients and their families through emotional , educational and financial support.  929-211-1634 ? [place] DSS Where to apply for food stamps, Medicaid and utility assistance. 580-007-4597 ? [facility]: Transportation to medical appointments. 606-093-7661 ? Social Security Administration: May apply for disability if have a Stage IV cancer. (630) 528-5150 847-141-0931 ? [employer], Disability and Transit Services: Assists with nutrition, care and transit needs. [place] Support Programs: @10RELATIVEDAYS @ > Cancer Support Group  2nd Tuesday of the month 1pm-2pm, Journey Room  > Creative Journey  3rd Tuesday of the month 1130am-1pm, Journey Room  > Look Good Feel Better  1st Wednesday of the month 10am-12 noon, Journey Room (Call [facility] to register [phone])

## 2016-06-14 NOTE — Progress Notes (Signed)
[facility] Kitchen    HEMATOLOGY/ONCOLOGY CONSULTATION NOTE  Date of Service: 06/14/2016  Patient Care Team: [name], MD as PCP - General (Internal Medicine)  CHIEF COMPLAINTS/PURPOSE OF CONSULTATION:   Continued treatment of GBM.  HISTORY OF PRESENTING ILLNESS:  Phillip Mckay is a wonderful 75 y.o. male who has been referred to [place] by Dr Neijstrom/Dr Sherrie Sport for evaluation and management of GBM Patient subsequently had a subtotal resection of about 80% of the tumor mass by Dr. Owens Shark on 10/25/2015 at [facility]. He was started on concurrent radiation therapy plus temozolomide (75 mg meter squared = 140 mg daily while on radiation) chemotherapy. Dr. Lisbeth Renshaw is his radiation oncologist.  He was started on his first cycle of adjuvant temozolomide at 150 mg/m (280 mg daily from D1 to D5) On 02/21/2016.  He was due for his second cycle on 03/19/2016.  His platelet counts were down to 80k and his temozolomide was held. Dose escalation to 200 mg/m was not done and he was asked to wait to restrict his improvement prior to proceeding with his second cycle of temozolomide.  Keppra was discontinued and platelets recovered. Temodar was kept at the 150 mg/m2 dosing, which he started on August 7th and took for 5 days. He is due to start again on September 4th, this upcoming Monday.   Patient accompanied by wife. He is in a wheelchair and wearing alternating electrical field therapy.  I have personally reviewed and went over labs with patient.  Patient says he has felt sick for the last two weeks and anything he smells makes him nauseas. Patient says he does not know if it's because of temodar. He started chemo pills on Monday but wife states he was sick before he started the chemo pill. Wife mentioned that he was eating a little better yesterday and the day before yesterday but Sunday he did not eat or drink anything.    Patient says he felt like food was stopping in his throat and  felt like it was "going to come back up." He denies any vomiting. His wife states that the only thing he eats is mac n cheese and tomato soup. He also can tolerate eggs and eats eggs every morning.   He also reports diarrhea but says his last time having diarrhea was last night. He says his diarrhea has gotten better.  When asked what he feels is causing the nausea, he believes it is the chemo pills but wife repeated that he was sick before the chemo pills.  When asked if he is feeling better or staying the same, he says he feels like he is staying the same and does not like feel like he is getting any better.   He said he has no problem sleeping but wife says Sunday night he was so nauseated he was doubting whether or not he could handle treatment anymore.   His wife states that he is taking pills for nausea and says they are helping. Patient said that he felt like the feeling of wanting to vomit ceased with the nausea pills.   Patient says alternating electrical field therapy itches and he doesn't like wearing it.  He is currently on 20 mg of prilosec  Patient has lost ten pounds.  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 In  his wife have 2 sons and they adopted one daughter Phillip Mckay is with him today.  #4 history of hypertension #5 history of dyslipidemia  SURGICAL HISTORY:   10/25/2015 Surgery  Subtotal resection Dr. Owens Shark   SOCIAL HISTORY: Social History   Social History  . Marital status: Married    Spouse name: N/A  . Number of children: N/A  . Years of education: N/A   Occupational History  . Not on file.   Social History Main Topics  . Smoking status: Former Research scientist (life sciences)  . Smokeless tobacco: Never Used  . Alcohol use No  . Drug use: No  . Sexual activity: No   Other Topics Concern  . Not on file     Social History Narrative  . No narrative on file  former smoker quit at age 18 yo Worked as a Pharmacist, community for about 40 years.  FAMILY HISTORY: Family History  Problem Relation Age of Onset  . Cancer Mother   . Diabetes Mother   . Cancer Father   . Cancer Brother     ALLERGIES:  has No Known Allergies.  MEDICATIONS:  Current Outpatient Prescriptions  Medication Sig Dispense Refill  . b complex vitamins tablet Take 1 tablet by mouth daily. 30 tablet 2  . B-D UF III MINI PEN NEEDLES 31G X 5 MM MISC     . B-D ULTRA-FINE 33 LANCETS MISC by Does not apply route.    Marland Kitchen dexamethasone (DECADRON) 4 MG tablet Reduce to 4mg  po daily x 7 days then 4mg  alternating with 2mg  every other day x 10 days then 2mg  po daily till next appointment 60 tablet 0  . Docusate Sodium 100 MG capsule Take by mouth.    . fluconazole (DIFLUCAN) 100 MG tablet Reported on 04/20/2016    . gabapentin (NEURONTIN) 300 MG capsule Reported on 04/20/2016    . glucose blood (FREESTYLE INSULINX TEST) test strip     . Glycerin-Hypromellose-PEG 400 0.2-0.2-1 % SOLN Apply to eye. Reported on 04/20/2016    . insulin aspart (NOVOLOG) 100 UNIT/ML injection Inject into the skin.    Marland Kitchen latanoprost (XALATAN) 0.005 % ophthalmic solution Apply to eye. Reported on 04/20/2016    . lisinopril (PRINIVIL,ZESTRIL) 40 MG tablet     . LORazepam (ATIVAN) 1 MG tablet Take as needed for seizure. Take 1 mg at onset may repeat in 5 minute intervals X 3 30 tablet 3  . metFORMIN (GLUCOPHAGE) 1000 MG tablet Take by mouth.    Marland Kitchen omeprazole (PRILOSEC) 20 MG capsule Take 1 capsule (20 mg total) by mouth 2 (two) times daily before a meal. 60 capsule 2  . ondansetron (ZOFRAN) 8 MG tablet Take by mouth.    . potassium chloride SA (K-DUR,KLOR-CON) 20 MEQ tablet Take 1 tablet (20 mEq total) by mouth daily. 30 tablet 1  . Probiotic Product [facility]) CAPS Take by mouth.    . prochlorperazine (COMPAZINE) 10 MG tablet Take 1 tablet (10 mg total) by mouth every 6 (six)  hours as needed for nausea or vomiting. Reported on 04/20/2016 30 tablet 2  . simvastatin (ZOCOR) 20 MG tablet Reported on 04/20/2016    . tamsulosin (FLOMAX) 0.4 MG CAPS capsule Take by mouth.    . temozolomide (TEMODAR) 140 MG capsule Take 3 capsules (420 mg total) by mouth daily. May take on an empty stomach or at bedtime to decrease nausea & vomiting. TAKE for 5 days every 28 days 15 capsule 6  . tetrahydrozoline 0.05 % ophthalmic solution Reported on  04/20/2016    . traMADol (ULTRAM) 50 MG tablet Take 1 tablet (50 mg total) by mouth every 6 (six) hours as needed. 60 tablet 2  . travoprost, benzalkonium, (TRAVATAN) 0.004 % ophthalmic solution 1 drop at bedtime.    . Vitamin D, Ergocalciferol, (DRISDOL) 50000 units CAPS capsule Take by mouth.     No current facility-administered medications for this visit.     REVIEW OF SYSTEMS:    Positive for appetite loss Appetite loss over the last few weeks Positive for nausea Nausea managed with medication Positive for diarrhea Last had it at midnight, but now its better Positive for weight lost Lost ten pounds in the last month 14 point review of systems was performed and is negative except as detailed under history of present illness and above   PHYSICAL EXAMINATION: ECOG PERFORMANCE STATUS: 2 - Symptomatic, <50% confined to bed   Vitals:   06/14/16 0940  BP: 106/64  Pulse: 87  Resp: 16  Temp: 98.1 F (36.7 C)   Filed Weights   06/14/16 0940  Weight: 173 lb 1.6 oz (78.5 kg)   .Body mass index is 27.11 kg/m.  GENERAL:alert, in no acute distress and comfortable, has wires from the alternating electrical currents machine on his head and in wheelchair. He needed assistance to get on exam table. SKIN: skin color, texture, turgor are normal, no rashes or significant lesions EYES: normal, conjunctiva are pink and non-injected, sclera clear OROPHARYNX:no exudate, no erythema and lips, buccal mucosa, and tongue normal  NECK: supple, no  JVD, thyroid normal size, non-tender, without nodularity LYMPH:  no palpable lymphadenopathy in the cervical, axillary or inguinal LUNGS: clear to auscultation with normal respiratory effort HEART: regular rate & rhythm,  no murmurs and no lower extremity edema ABDOMEN: abdomen soft, non-tender, normoactive bowel sounds  Musculoskeletal: no cyanosis of digits and no clubbing  PSYCH: alert & oriented x 3 with fluent speech NEURO: Moving all 4 extremities, some clumsiness in his left upper extremity  LABORATORY DATA:  I have reviewed the data as listed  . CBC Latest Ref Rng & Units 06/14/2016 05/31/2016 05/25/2016  WBC 4.0 - 10.5 K/uL 4.7 6.3 6.7  Hemoglobin 13.0 - 17.0 g/dL 11.4(L) 12.1(L) 12.2(L)  Hematocrit 39.0 - 52.0 % 33.9(L) 35.9(L) 35.2(L)  Platelets 150 - 400 K/uL 105(L) 189 156   . CMP Latest Ref Rng & Units 06/14/2016 05/31/2016 05/25/2016  Glucose 65 - 99 mg/dL 98 102(H) 103(H)  BUN 6 - 20 mg/dL 12 13 11   Creatinine 0.61 - 1.24 mg/dL 1.09 0.88 0.89  Sodium 135 - 145 mmol/L 139 138 138  Potassium 3.5 - 5.1 mmol/L 4.3 4.1 3.7  Chloride 101 - 111 mmol/L 107 107 107  CO2 22 - 32 mmol/L 25 24 24   Calcium 8.9 - 10.3 mg/dL 10.0 9.4 9.3  Total Protein 6.5 - 8.1 g/dL 6.2(L) 6.2(L) 6.5  Total Bilirubin 0.3 - 1.2 mg/dL 1.0 1.0 1.1  Alkaline Phos 38 - 126 U/L 33(L) 35(L) 36(L)  AST 15 - 41 U/L 29 31 23   ALT 17 - 63 U/L 25 38 22    Final Diagnosis  Brain, right parietal, biopsies (specimens 1-2): - Glioblastoma, WHO grade 4; see comment.  Electronically signed by Golden Pop, MD on 10/26/2015 at [place]: I have personally reviewed the radiological images as listed and agreed with the findings in the report. No results found. Study Result   CLINICAL DATA:  Right parietal glioblastoma status post subtotal  resection on 10/25/2015 followed by radiation therapy and Temodar.  EXAM: MRI HEAD WITHOUT AND WITH CONTRAST  TECHNIQUE: Multiplanar, multiecho  pulse sequences of the brain and surrounding structures were obtained without and with intravenous contrast.  CONTRAST:  3mL MULTIHANCE GADOBENATE DIMEGLUMINE 529 MG/ML IV SOLN  COMPARISON:  11/18/2015  FINDINGS: Sequelae of right parietal craniotomy and tumor resection are again identified. Chronic blood products are again seen in the right parietal resection cavity which has decreased in size, currently measuring 3.2 cm (previously 4.0 cm). T2 hyperintensity immediately adjacent to the resection cavity, specifically medially and superiorly, has decreased. Enhancement along the margins of the resection cavity on the prior study has largely resolved, with only minimal residual enhancement noted anteriorly. No abnormal enhancement is identified distant to the resection site.  Confluent T2 hyperintensity more anteriorly involving the centrum semiovale, corona radiata, and external capsule on the right has increased from the prior study without associated mass effect. Similar but slightly less prominent changes are present at these same locations in the left cerebral hemisphere, also increased from prior. There is no evidence of acute infarct, midline shift, or extra-axial fluid collection. Sequelae of prior left occipital craniectomy are again identified with unchanged underlying left cerebellar encephalomalacia. There is mild cerebral atrophy.  Orbits are unremarkable. Paranasal sinuses and mastoid air cells are clear. Major intracranial vascular flow voids are preserved.  IMPRESSION: 1. Overall improved appearance of the right parietal resection site with largely resolved enhancement and decreased immediately surrounding T2 signal abnormality. 2. Increased white matter changes more anteriorly in both cerebral hemispheres which may reflect post treatment change. Attention on follow-up to differentiate from nonenhancing tumor.   Electronically Signed   By: Logan Bores  M.D.   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 Completed concurrent temozolomide and radiation therapy. Received his first cycle of adjuvant temozolomide on 02/21/2016. Patient was due for his second cycle of temozolomide on 03/19/2016 but was postponed due to thrombocytopenia with platelets of 80k Given his thrombocytopenia his dose for second cycle of temozolomide was maintained at 150mg /m2 and started on August 7th.  Labs reviewed.   Patient has lost ten pounds since 05/17/2016.  I advised the patient to increase Prilosec to twice daily. I also advised him to stop potassium and see how he feels. If he needs to go back on it, we can write a new prescription.   Will look into delaying temodar and see if he feels better by next week. His dose was escalated to 200 mg/m2. If he is improved then we may need to decrease his dose back to 150 mg/m2. He will come in for a nurse visit with weight check.   Patient is scheduled to follow up with Dr. Lisbeth Renshaw on Sept. 14th.   Patient is due to start temodar on Sept. 4th but will delay this until he returns next week. I will see him back for follow up on Sept. 15th. I have encouraged his wife to call us if his symptoms do not improve prior to follow-up.  All of the patients questions were answered with apparent satisfaction. The patient knows to call the clinic with any problems, questions or concerns.  This document serves as a record of services personally performed by Ancil Linsey, MD.  It was created on her behalf by Elmyra Ricks, a trained medical scribe. The creation of this record is based on the scribe's personal observations and the provider's statements to them. This document has been checked and approved by the  attending provider.  I have reviewed the above documentation for accuracy and completeness, and I agree with the above.  Molli Hazard, MD   06/14/2016 1:50 PM

## 2016-06-16 ENCOUNTER — Encounter (HOSPITAL_COMMUNITY): Payer: Self-pay | Admitting: Hematology & Oncology

## 2016-06-21 ENCOUNTER — Encounter (HOSPITAL_COMMUNITY): Payer: Medicare Other | Attending: Hematology & Oncology

## 2016-06-21 ENCOUNTER — Encounter (HOSPITAL_COMMUNITY): Payer: Medicare Other

## 2016-06-21 DIAGNOSIS — E2740 Unspecified adrenocortical insufficiency: Secondary | ICD-10-CM | POA: Diagnosis not present

## 2016-06-21 DIAGNOSIS — D696 Thrombocytopenia, unspecified: Secondary | ICD-10-CM | POA: Insufficient documentation

## 2016-06-21 DIAGNOSIS — I10 Essential (primary) hypertension: Secondary | ICD-10-CM | POA: Diagnosis not present

## 2016-06-21 DIAGNOSIS — Z9221 Personal history of antineoplastic chemotherapy: Secondary | ICD-10-CM | POA: Insufficient documentation

## 2016-06-21 DIAGNOSIS — Z79899 Other long term (current) drug therapy: Secondary | ICD-10-CM | POA: Insufficient documentation

## 2016-06-21 DIAGNOSIS — Z794 Long term (current) use of insulin: Secondary | ICD-10-CM | POA: Diagnosis not present

## 2016-06-21 DIAGNOSIS — E785 Hyperlipidemia, unspecified: Secondary | ICD-10-CM | POA: Diagnosis not present

## 2016-06-21 DIAGNOSIS — C719 Malignant neoplasm of brain, unspecified: Secondary | ICD-10-CM | POA: Diagnosis not present

## 2016-06-21 DIAGNOSIS — E099 Drug or chemical induced diabetes mellitus without complications: Secondary | ICD-10-CM | POA: Insufficient documentation

## 2016-06-21 DIAGNOSIS — R634 Abnormal weight loss: Secondary | ICD-10-CM | POA: Insufficient documentation

## 2016-06-21 DIAGNOSIS — Z923 Personal history of irradiation: Secondary | ICD-10-CM | POA: Insufficient documentation

## 2016-06-21 DIAGNOSIS — T380X5A Adverse effect of glucocorticoids and synthetic analogues, initial encounter: Secondary | ICD-10-CM | POA: Insufficient documentation

## 2016-06-21 DIAGNOSIS — Z87891 Personal history of nicotine dependence: Secondary | ICD-10-CM | POA: Diagnosis not present

## 2016-06-21 LAB — COMPREHENSIVE METABOLIC PANEL
ALT: 25 U/L (ref 17–63)
ANION GAP: 5 (ref 5–15)
AST: 29 U/L (ref 15–41)
Albumin: 4.2 g/dL (ref 3.5–5.0)
Alkaline Phosphatase: 35 U/L — ABNORMAL LOW (ref 38–126)
BILIRUBIN TOTAL: 1 mg/dL (ref 0.3–1.2)
BUN: 9 mg/dL (ref 6–20)
CALCIUM: 9.6 mg/dL (ref 8.9–10.3)
CO2: 23 mmol/L (ref 22–32)
Chloride: 111 mmol/L (ref 101–111)
Creatinine, Ser: 0.9 mg/dL (ref 0.61–1.24)
GFR calc Af Amer: >60 mL/min (ref >60)
Glucose, Bld: 118 mg/dL — ABNORMAL HIGH (ref 65–99)
POTASSIUM: 4 mmol/L (ref 3.5–5.1)
Sodium: 139 mmol/L (ref 135–145)
TOTAL PROTEIN: 6.7 g/dL (ref 6.5–8.1)

## 2016-06-21 LAB — CBC WITH DIFFERENTIAL/PLATELET
Basophils Absolute: 0 10*3/uL (ref 0.0–0.1)
Basophils Relative: 0 %
Eosinophils Absolute: 0.1 10*3/uL (ref 0.0–0.7)
Eosinophils Relative: 1 %
HEMATOCRIT: 35.2 % — AB (ref 39.0–52.0)
Hemoglobin: 12.2 g/dL — ABNORMAL LOW (ref 13.0–17.0)
LYMPHS ABS: 0.6 10*3/uL — AB (ref 0.7–4.0)
LYMPHS PCT: 13 %
MCH: 33.9 pg (ref 26.0–34.0)
MCHC: 34.7 g/dL (ref 30.0–36.0)
MCV: 97.8 fL (ref 78.0–100.0)
MONO ABS: 0.5 10*3/uL (ref 0.1–1.0)
MONOS PCT: 10 %
NEUTROS ABS: 3.7 10*3/uL (ref 1.7–7.7)
Neutrophils Relative %: 76 %
Platelets: 153 10*3/uL (ref 150–400)
RBC: 3.6 MIL/uL — ABNORMAL LOW (ref 4.22–5.81)
RDW: 14.5 % (ref 11.5–15.5)
WBC: 4.9 10*3/uL (ref 4.0–10.5)

## 2016-06-28 ENCOUNTER — Other Ambulatory Visit (HOSPITAL_COMMUNITY): Payer: Medicare Other

## 2016-06-28 ENCOUNTER — Ambulatory Visit (HOSPITAL_COMMUNITY): Payer: Medicare Other | Admitting: Hematology & Oncology

## 2016-06-29 ENCOUNTER — Ambulatory Visit (HOSPITAL_COMMUNITY): Payer: Medicare Other | Admitting: Hematology & Oncology

## 2016-06-29 ENCOUNTER — Encounter (HOSPITAL_COMMUNITY): Payer: Medicare Other

## 2016-06-29 DIAGNOSIS — C719 Malignant neoplasm of brain, unspecified: Secondary | ICD-10-CM | POA: Diagnosis not present

## 2016-06-29 DIAGNOSIS — E860 Dehydration: Secondary | ICD-10-CM

## 2016-06-29 LAB — CBC WITH DIFFERENTIAL/PLATELET
BASOS ABS: 0 10*3/uL (ref 0.0–0.1)
BASOS PCT: 1 %
Eosinophils Absolute: 0.1 10*3/uL (ref 0.0–0.7)
Eosinophils Relative: 1 %
HEMATOCRIT: 33.2 % — AB (ref 39.0–52.0)
HEMOGLOBIN: 11.4 g/dL — AB (ref 13.0–17.0)
LYMPHS PCT: 16 %
Lymphs Abs: 0.6 10*3/uL — ABNORMAL LOW (ref 0.7–4.0)
MCH: 33.5 pg (ref 26.0–34.0)
MCHC: 34.3 g/dL (ref 30.0–36.0)
MCV: 97.6 fL (ref 78.0–100.0)
Monocytes Absolute: 0.5 10*3/uL (ref 0.1–1.0)
Monocytes Relative: 13 %
NEUTROS ABS: 2.5 10*3/uL (ref 1.7–7.7)
NEUTROS PCT: 69 %
Platelets: 134 10*3/uL — ABNORMAL LOW (ref 150–400)
RBC: 3.4 MIL/uL — AB (ref 4.22–5.81)
RDW: 14 % (ref 11.5–15.5)
WBC: 3.6 10*3/uL — ABNORMAL LOW (ref 4.0–10.5)

## 2016-06-29 LAB — COMPREHENSIVE METABOLIC PANEL
ALBUMIN: 3.9 g/dL (ref 3.5–5.0)
ALK PHOS: 34 U/L — AB (ref 38–126)
ALT: 22 U/L (ref 17–63)
AST: 32 U/L (ref 15–41)
Anion gap: 8 (ref 5–15)
BILIRUBIN TOTAL: 0.9 mg/dL (ref 0.3–1.2)
BUN: 12 mg/dL (ref 6–20)
CALCIUM: 9.7 mg/dL (ref 8.9–10.3)
CO2: 24 mmol/L (ref 22–32)
CREATININE: 1.06 mg/dL (ref 0.61–1.24)
Chloride: 108 mmol/L (ref 101–111)
GFR calc Af Amer: >60 mL/min (ref >60)
GFR calc non Af Amer: >60 mL/min (ref >60)
GLUCOSE: 117 mg/dL — AB (ref 65–99)
POTASSIUM: 4 mmol/L (ref 3.5–5.1)
Sodium: 140 mmol/L (ref 135–145)
Total Protein: 6 g/dL — ABNORMAL LOW (ref 6.5–8.1)

## 2016-06-29 NOTE — Progress Notes (Signed)
HEMATOLOGY/ONCOLOGY PROGRESS NOTE  Date of Service: 07/02/2016  Patient Care Team: [name], MD as PCP - General (Internal Medicine)  CHIEF COMPLAINTS/PURPOSE OF CONSULTATION:   Continued treatment of GBM.  HISTORY OF PRESENTING ILLNESS:  Phillip Mckay is a wonderful 75 y.o. male who has been referred to [place] by Dr Neijstrom/Dr Sherrie Sport for evaluation and management of GBM Patient subsequently had a subtotal resection of about 80% of the tumor mass by Dr. Owens Shark on 10/25/2015 at [facility]. He was started on concurrent radiation therapy plus temozolomide (75 mg meter squared = 140 mg daily while on radiation) chemotherapy. Dr. Lisbeth Renshaw is his radiation oncologist.  He was started on his first cycle of adjuvant temozolomide at 150 mg/m (280 mg daily from D1 to D5) On 02/21/2016.  He was due for his second cycle on 03/19/2016.  His platelet counts were down to 80k and his temozolomide was held. Dose escalation to 200 mg/m was not done and he was asked to wait to restrict his improvement prior to proceeding with his second cycle of temozolomide.  Keppra was discontinued and platelets recovered. Temodar was kept at the 150 mg/m2 dosing, which he started on August 7th and took for 5 days.   Phillip Mckay is accompanied by his wife and presents in wheelchair. He is wearing alternating electrical field therapy.  He is sleepy today because he didn't get much sleep last night. States he'll sleep for 3 or 4 hours then he can't get back to sleep so he'll watch television. He does not believe he is depressed. His wife reports he sleeps a lot during the day. He feels tired during the day and admits he may be bored as well since he used to be on the go all the time. His wife will drive him around since he is unable to sit on the porch with the cooler weather.  On Saturday they visited several of their friends at the nursing home.  He reports intermittent abdominal  discomfort that was present before restarting his chemotherapy pill back. He believes it is just gas. His wife believes he is hungry.   He reports taste loss with Temodar.  He started Temodar back on Saturday night. (note now on 200 mg/m2)  On Monday, he felt nauseated and didn't want to eat or smell anything cooking. He felt as though he would vomit if he ate. He also felt like he'd feel better if he did vomit. He denies vomiting. Yesterday was a good day, though he didn't make it to church yesterday and made it the past two Sundays. He didn't go to church because he hadn't ate or slept well. He also notes the preacher mostly just reads scripture which he doesn't enjoy as much.   He last saw radiation oncology, Dr. Lisbeth Renshaw, last Thursday. He will follow up with him again in 3 months.   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 In his wife have 2 sons and they adopted one daughter Phillip Mckay is with him today.  #4 history of hypertension #5 history of dyslipidemia  SURGICAL HISTORY:   10/25/2015 Surgery  Subtotal resection Dr. Owens Shark   SOCIAL HISTORY: Social History   Social History  . Marital status: Married    Spouse name: N/A  . Number of children:  N/A  . Years of education: N/A   Occupational History  . Not on file.   Social History Main Topics  . Smoking status: Former Research scientist (life sciences)  . Smokeless tobacco: Never Used  . Alcohol use No  . Drug use: No  . Sexual activity: No   Other Topics Concern  . Not on file   Social History Narrative  . No narrative on file  former smoker quit at age 19 yo Worked as a Pharmacist, community for about 40 years.  FAMILY HISTORY: Family History  Problem Relation Age of Onset  . Cancer Mother   . Diabetes Mother   . Cancer Father   . Cancer Brother     ALLERGIES:  has No Known  Allergies.  MEDICATIONS:  Current Outpatient Prescriptions  Medication Sig Dispense Refill  . b complex vitamins tablet Take 1 tablet by mouth daily. 30 tablet 2  . B-D UF III MINI PEN NEEDLES 31G X 5 MM MISC     . B-D ULTRA-FINE 33 LANCETS MISC by Does not apply route.    Marland Kitchen dexamethasone (DECADRON) 4 MG tablet Reduce to 4mg  po daily x 7 days then 4mg  alternating with 2mg  every other day x 10 days then 2mg  po daily till next appointment 60 tablet 0  . Docusate Sodium 100 MG capsule Take by mouth.    . fluconazole (DIFLUCAN) 100 MG tablet Reported on 04/20/2016    . gabapentin (NEURONTIN) 300 MG capsule Reported on 04/20/2016    . glucose blood (FREESTYLE INSULINX TEST) test strip     . Glycerin-Hypromellose-PEG 400 0.2-0.2-1 % SOLN Apply to eye. Reported on 04/20/2016    . insulin aspart (NOVOLOG) 100 UNIT/ML injection Inject into the skin.    Marland Kitchen latanoprost (XALATAN) 0.005 % ophthalmic solution Apply to eye. Reported on 04/20/2016    . lisinopril (PRINIVIL,ZESTRIL) 40 MG tablet     . LORazepam (ATIVAN) 1 MG tablet Take as needed for seizure. Take 1 mg at onset may repeat in 5 minute intervals X 3 30 tablet 3  . metFORMIN (GLUCOPHAGE) 1000 MG tablet Take by mouth.    Marland Kitchen omeprazole (PRILOSEC) 20 MG capsule Take 1 capsule (20 mg total) by mouth 2 (two) times daily before a meal. 60 capsule 2  . ondansetron (ZOFRAN) 8 MG tablet Take by mouth.    . potassium chloride SA (K-DUR,KLOR-CON) 20 MEQ tablet Take 1 tablet (20 mEq total) by mouth daily. 30 tablet 1  . Probiotic Product [facility]) CAPS Take by mouth.    . prochlorperazine (COMPAZINE) 10 MG tablet Take 1 tablet (10 mg total) by mouth every 6 (six) hours as needed for nausea or vomiting. Reported on 04/20/2016 30 tablet 2  . simvastatin (ZOCOR) 20 MG tablet Reported on 04/20/2016    . tamsulosin (FLOMAX) 0.4 MG CAPS capsule Take by mouth.    . temozolomide (TEMODAR) 140 MG capsule Take 3 capsules (420 mg total) by mouth daily. May take on an empty  stomach or at bedtime to decrease nausea & vomiting. TAKE for 5 days every 28 days 15 capsule 6  . tetrahydrozoline 0.05 % ophthalmic solution Reported on 04/20/2016    . traMADol (ULTRAM) 50 MG tablet Take 1 tablet (50 mg total) by mouth every 6 (six) hours as needed. 60 tablet 2  . travoprost, benzalkonium, (TRAVATAN) 0.004 % ophthalmic solution 1 drop at bedtime.    . Vitamin D, Ergocalciferol, (DRISDOL) 50000 units CAPS capsule Take by mouth.     No current facility-administered medications  for this visit.     REVIEW OF SYSTEMS:    Positive for appetite loss Appetite loss with Temodar Positive for nausea Nausea managed with Temodar Positive for abdominal discomfort Intermittent abdominal discomfort attributed to gas Positive for insomnia Trouble sleeping 14 point review of systems was performed and is negative except as detailed under history of present illness and above   PHYSICAL EXAMINATION: ECOG PERFORMANCE STATUS: 2 - Symptomatic, <50% confined to bed   Vitals:   07/02/16 1000  BP: 109/62  Pulse: 81  Resp: 16  Temp: 98.4 F (36.9 C)   Filed Weights   07/02/16 1000  Weight: 169 lb 9.6 oz (76.9 kg)   .Body mass index is 26.56 kg/m.  GENERAL:alert, in no acute distress and comfortable, has wires from the alternating electrical currents machine on his head and in wheelchair. He needed assistance to get on exam table. SKIN: skin color, texture, turgor are normal, no rashes or significant lesions EYES: normal, conjunctiva are pink and non-injected, sclera clear OROPHARYNX:no exudate, no erythema and lips, buccal mucosa, and tongue normal  NECK: supple, no JVD, thyroid normal size, non-tender, without nodularity LYMPH:  no palpable lymphadenopathy in the cervical, axillary or inguinal LUNGS: clear to auscultation with normal respiratory effort HEART: regular rate & rhythm,  no murmurs and no lower extremity edema ABDOMEN: abdomen soft, non-tender, normoactive bowel  sounds  Musculoskeletal: no cyanosis of digits and no clubbing  PSYCH: alert & oriented x 3 with fluent speech NEURO: Moving all 4 extremities, some clumsiness in his left upper extremity  LABORATORY DATA:  I have reviewed the data as listed  . CBC Latest Ref Rng & Units 06/29/2016 06/21/2016 06/14/2016  WBC 4.0 - 10.5 K/uL 3.6(L) 4.9 4.7  Hemoglobin 13.0 - 17.0 g/dL 11.4(L) 12.2(L) 11.4(L)  Hematocrit 39.0 - 52.0 % 33.2(L) 35.2(L) 33.9(L)  Platelets 150 - 400 K/uL 134(L) 153 105(L)   . CMP Latest Ref Rng & Units 06/29/2016 06/21/2016 06/14/2016  Glucose 65 - 99 mg/dL 117(H) 118(H) 98  BUN 6 - 20 mg/dL 12 9 12   Creatinine 0.61 - 1.24 mg/dL 1.06 0.90 1.09  Sodium 135 - 145 mmol/L 140 139 139  Potassium 3.5 - 5.1 mmol/L 4.0 4.0 4.3  Chloride 101 - 111 mmol/L 108 111 107  CO2 22 - 32 mmol/L 24 23 25   Calcium 8.9 - 10.3 mg/dL 9.7 9.6 10.0  Total Protein 6.5 - 8.1 g/dL 6.0(L) 6.7 6.2(L)  Total Bilirubin 0.3 - 1.2 mg/dL 0.9 1.0 1.0  Alkaline Phos 38 - 126 U/L 34(L) 35(L) 33(L)  AST 15 - 41 U/L 32 29 29  ALT 17 - 63 U/L 22 25 25     Final Diagnosis  Brain, right parietal, biopsies (specimens 1-2): - Glioblastoma, WHO grade 4; see comment.  Electronically signed by Golden Pop, MD on 10/26/2015 at [place]: I have personally reviewed the radiological images as listed and agreed with the findings in the report. No results found. Study Result   CLINICAL DATA:  Right parietal glioblastoma status post subtotal resection on 10/25/2015 followed by radiation therapy and Temodar.  EXAM: MRI HEAD WITHOUT AND WITH CONTRAST  TECHNIQUE: Multiplanar, multiecho pulse sequences of the brain and surrounding structures were obtained without and with intravenous contrast.  CONTRAST:  70mL MULTIHANCE GADOBENATE DIMEGLUMINE 529 MG/ML IV SOLN  COMPARISON:  11/18/2015  FINDINGS: Sequelae of right parietal craniotomy and tumor resection are again identified. Chronic  blood products are again seen in the right  parietal resection cavity which has decreased in size, currently measuring 3.2 cm (previously 4.0 cm). T2 hyperintensity immediately adjacent to the resection cavity, specifically medially and superiorly, has decreased. Enhancement along the margins of the resection cavity on the prior study has largely resolved, with only minimal residual enhancement noted anteriorly. No abnormal enhancement is identified distant to the resection site.  Confluent T2 hyperintensity more anteriorly involving the centrum semiovale, corona radiata, and external capsule on the right has increased from the prior study without associated mass effect. Similar but slightly less prominent changes are present at these same locations in the left cerebral hemisphere, also increased from prior. There is no evidence of acute infarct, midline shift, or extra-axial fluid collection. Sequelae of prior left occipital craniectomy are again identified with unchanged underlying left cerebellar encephalomalacia. There is mild cerebral atrophy.  Orbits are unremarkable. Paranasal sinuses and mastoid air cells are clear. Major intracranial vascular flow voids are preserved.  IMPRESSION: 1. Overall improved appearance of the right parietal resection site with largely resolved enhancement and decreased immediately surrounding T2 signal abnormality. 2. Increased white matter changes more anteriorly in both cerebral hemispheres which may reflect post treatment change. Attention on follow-up to differentiate from nonenhancing tumor.   Electronically Signed   By: Logan Bores M.D.   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 Completed  concurrent temozolomide and radiation therapy. Received his first cycle of adjuvant temozolomide on 02/21/2016. Patient was due for his second cycle of temozolomide on 03/19/2016 but was postponed due to thrombocytopenia with platelets of 80k Given his thrombocytopenia his dose for second cycle of temozolomide was maintained at 150mg /m2 and started on August 7th.  Restarted Temodar last Saturday night and finished on Thursday, 06/28/2016. He is now on full dose maintenance at 200 mg/m2. He reports nausea and taste loss with therapy. He has not gained weight but his weight remains stable.   He will receive a flu shot today.   I have refilled his zofran and Vitamin D. We discussed changing his PPI.  He will follow up with radiation, Dr. Lisbeth Renshaw, in 3 months.  He will return for follow up in 2 weeks with labs and PE.  Orders Placed This Encounter  Procedures  . CBC with Differential    Standing Status:   Future    Standing Expiration Date:   07/02/2017  . Comprehensive metabolic panel    Standing Status:   Future    Standing Expiration Date:   07/02/2017    All of the patients questions were answered with apparent satisfaction. The patient knows to call the clinic with any problems, questions or concerns.  This document serves as a record of services personally performed by Ancil Linsey, MD. It was created on her behalf by Arlyce Harman, a trained medical scribe. The creation of this record is based on the scribe's personal observations and the provider's statements to them. This document has been checked and approved by the attending provider.  I have reviewed the above documentation for accuracy and completeness, and I agree with the above.  Molli Hazard, MD   07/02/2016 10:39 AM

## 2016-07-02 ENCOUNTER — Encounter (HOSPITAL_BASED_OUTPATIENT_CLINIC_OR_DEPARTMENT_OTHER): Payer: Medicare Other | Admitting: Hematology & Oncology

## 2016-07-02 ENCOUNTER — Encounter (HOSPITAL_COMMUNITY): Payer: Self-pay | Admitting: Hematology & Oncology

## 2016-07-02 VITALS — BP 109/62 | HR 81 | Temp 98.4°F | Resp 16 | Wt 169.6 lb

## 2016-07-02 DIAGNOSIS — C719 Malignant neoplasm of brain, unspecified: Secondary | ICD-10-CM

## 2016-07-02 DIAGNOSIS — Z23 Encounter for immunization: Secondary | ICD-10-CM

## 2016-07-02 DIAGNOSIS — R634 Abnormal weight loss: Secondary | ICD-10-CM | POA: Diagnosis not present

## 2016-07-02 DIAGNOSIS — Z0000 Encounter for general adult medical examination without abnormal findings: Secondary | ICD-10-CM

## 2016-07-02 DIAGNOSIS — R110 Nausea: Secondary | ICD-10-CM

## 2016-07-02 MED ORDER — PANTOPRAZOLE SODIUM 40 MG PO TBEC
40.0000 mg | DELAYED_RELEASE_TABLET | Freq: Every day | ORAL | 1 refills | Status: DC
Start: 2016-07-02 — End: 2016-08-27

## 2016-07-02 MED ORDER — INFLUENZA VAC SPLIT QUAD 0.5 ML IM SUSY
0.5000 mL | PREFILLED_SYRINGE | Freq: Once | INTRAMUSCULAR | Status: AC
Start: 2016-07-02 — End: 2016-07-02
  Administered 2016-07-02: 0.5 mL via INTRAMUSCULAR
  Filled 2016-07-02: qty 0.5

## 2016-07-02 MED ORDER — VITAMIN D 50 MCG (2000 UT) PO TABS: 2000.0000 [IU] | ORAL_TABLET | Freq: Every day | ORAL | 3 refills | Status: AC

## 2016-07-02 MED ORDER — ONDANSETRON HCL 8 MG PO TABS: 8.0000 mg | ORAL_TABLET | Freq: Three times a day (TID) | ORAL | 2 refills | Status: DC | PRN

## 2016-07-02 NOTE — Progress Notes (Signed)
Phillip Mckay presents today for injection per MD orders. Flu Vaccine administered IM in right Upper Arm. Administration without incident. Patient tolerated well.

## 2016-07-02 NOTE — Patient Instructions (Addendum)
[place] at [facility] Discharge Instructions  RECOMMENDATIONS MADE BY THE CONSULTANT AND ANY TEST RESULTS WILL BE SENT TO YOUR REFERRING PHYSICIAN.  You saw Dr. Whitney Muse today. Flu shot today. Follow up in 2 weeks with lab work. Take Vitamin D daily. Changed Prilosec to Protonix. Zofran refilled. All prescriptions [facility].  Thank you for choosing [place] at [facility] to provide your oncology and hematology care.  To afford each patient quality time with our provider, please arrive at least 15 minutes before your scheduled appointment time.   Beginning January 23rd 2017 lab work for the [facility] will be done in the  Main lab at [facility] on 1st floor. If you have a lab appointment with the [place] please come in thru the  Main Entrance and check in at the main information desk  You need to re-schedule your appointment should you arrive 10 or more minutes late.  We strive to give you quality time with our providers, and arriving late affects you and other patients whose appointments are after yours.  Also, if you no show three or more times for appointments you may be dismissed from the clinic at the providers discretion.     Again, thank you for choosing [facility].  Our hope is that these requests will decrease the amount of time that you wait before being seen by our physicians.       _____________________________________________________________  Should you have questions after your visit to [facility], please contact our office at [phone] between the hours of 8:30 a.m. and 4:30 p.m.  Voicemails left after 4:30 p.m. will not be returned until the following business day.  For prescription refill requests, have your pharmacy contact our office.         Resources For Cancer Patients and their Caregivers ? American Cancer Society: Can assist with transportation, wigs, general needs,  runs Look Good Feel Better.        [phone] ? Cancer Care: Provides financial assistance, online support groups, medication/co-pay assistance.  1-800-813-HOPE 502-633-2127) ? [place] Assists [place] cancer patients and their families through emotional , educational and financial support.  818-648-5466 ? [place] DSS Where to apply for food stamps, Medicaid and utility assistance. 919-770-2917 ? RCATS: Transportation to medical appointments. 970-678-2740 ? Social Security Administration: May apply for disability if have a Stage IV cancer. 732-294-7077 3618248677 ? [employer], Disability and Transit Services: Assists with nutrition, care and transit needs. [place] Support Programs: @10RELATIVEDAYS @ > Cancer Support Group  2nd Tuesday of the month 1pm-2pm, Journey Room  > Creative Journey  3rd Tuesday of the month 1130am-1pm, Journey Room  > Look Good Feel Better  1st Wednesday of the month 10am-12 noon, Journey Room (Call [place] to register [phone])

## 2016-07-18 ENCOUNTER — Other Ambulatory Visit (HOSPITAL_COMMUNITY): Payer: Medicare Other

## 2016-07-18 ENCOUNTER — Ambulatory Visit (HOSPITAL_COMMUNITY): Payer: Medicare Other | Admitting: Oncology

## 2016-07-19 ENCOUNTER — Encounter (HOSPITAL_COMMUNITY): Payer: Medicare Other | Attending: Hematology & Oncology | Admitting: Oncology

## 2016-07-19 ENCOUNTER — Encounter (HOSPITAL_COMMUNITY): Payer: Medicare Other

## 2016-07-19 ENCOUNTER — Encounter (HOSPITAL_COMMUNITY): Payer: Self-pay | Admitting: Oncology

## 2016-07-19 ENCOUNTER — Telehealth (HOSPITAL_COMMUNITY): Payer: Self-pay | Admitting: Oncology

## 2016-07-19 VITALS — BP 120/69 | HR 86 | Temp 98.3°F | Resp 16 | Wt 170.2 lb

## 2016-07-19 DIAGNOSIS — Z923 Personal history of irradiation: Secondary | ICD-10-CM | POA: Insufficient documentation

## 2016-07-19 DIAGNOSIS — Z794 Long term (current) use of insulin: Secondary | ICD-10-CM | POA: Diagnosis not present

## 2016-07-19 DIAGNOSIS — E785 Hyperlipidemia, unspecified: Secondary | ICD-10-CM | POA: Insufficient documentation

## 2016-07-19 DIAGNOSIS — T380X5A Adverse effect of glucocorticoids and synthetic analogues, initial encounter: Secondary | ICD-10-CM | POA: Insufficient documentation

## 2016-07-19 DIAGNOSIS — C719 Malignant neoplasm of brain, unspecified: Secondary | ICD-10-CM | POA: Diagnosis present

## 2016-07-19 DIAGNOSIS — Z9221 Personal history of antineoplastic chemotherapy: Secondary | ICD-10-CM | POA: Diagnosis not present

## 2016-07-19 DIAGNOSIS — E099 Drug or chemical induced diabetes mellitus without complications: Secondary | ICD-10-CM | POA: Insufficient documentation

## 2016-07-19 DIAGNOSIS — Z79899 Other long term (current) drug therapy: Secondary | ICD-10-CM | POA: Diagnosis not present

## 2016-07-19 DIAGNOSIS — Z87891 Personal history of nicotine dependence: Secondary | ICD-10-CM | POA: Insufficient documentation

## 2016-07-19 DIAGNOSIS — R634 Abnormal weight loss: Secondary | ICD-10-CM | POA: Diagnosis not present

## 2016-07-19 DIAGNOSIS — D6959 Other secondary thrombocytopenia: Secondary | ICD-10-CM | POA: Diagnosis not present

## 2016-07-19 DIAGNOSIS — E2740 Unspecified adrenocortical insufficiency: Secondary | ICD-10-CM | POA: Diagnosis not present

## 2016-07-19 DIAGNOSIS — I10 Essential (primary) hypertension: Secondary | ICD-10-CM | POA: Diagnosis not present

## 2016-07-19 DIAGNOSIS — D696 Thrombocytopenia, unspecified: Secondary | ICD-10-CM | POA: Diagnosis not present

## 2016-07-19 DIAGNOSIS — X58XXXA Exposure to other specified factors, initial encounter: Secondary | ICD-10-CM | POA: Insufficient documentation

## 2016-07-19 LAB — COMPREHENSIVE METABOLIC PANEL
ALT: 15 U/L — ABNORMAL LOW (ref 17–63)
ANION GAP: 6 (ref 5–15)
AST: 24 U/L (ref 15–41)
Albumin: 3.7 g/dL (ref 3.5–5.0)
Alkaline Phosphatase: 30 U/L — ABNORMAL LOW (ref 38–126)
BUN: 10 mg/dL (ref 6–20)
CHLORIDE: 108 mmol/L (ref 101–111)
CO2: 27 mmol/L (ref 22–32)
Calcium: 9.4 mg/dL (ref 8.9–10.3)
Creatinine, Ser: 0.88 mg/dL (ref 0.61–1.24)
Glucose, Bld: 104 mg/dL — ABNORMAL HIGH (ref 65–99)
POTASSIUM: 3.7 mmol/L (ref 3.5–5.1)
Sodium: 141 mmol/L (ref 135–145)
Total Bilirubin: 0.8 mg/dL (ref 0.3–1.2)
Total Protein: 5.9 g/dL — ABNORMAL LOW (ref 6.5–8.1)

## 2016-07-19 LAB — CBC WITH DIFFERENTIAL/PLATELET
Basophils Absolute: 0 10*3/uL (ref 0.0–0.1)
Basophils Relative: 0 %
EOS ABS: 0.1 10*3/uL (ref 0.0–0.7)
Eosinophils Relative: 2 %
HCT: 32.7 % — ABNORMAL LOW (ref 39.0–52.0)
Hemoglobin: 11.3 g/dL — ABNORMAL LOW (ref 13.0–17.0)
Lymphocytes Relative: 19 %
Lymphs Abs: 0.6 10*3/uL — ABNORMAL LOW (ref 0.7–4.0)
MCH: 33.2 pg (ref 26.0–34.0)
MCHC: 34.6 g/dL (ref 30.0–36.0)
MCV: 96.2 fL (ref 78.0–100.0)
MONO ABS: 0.5 10*3/uL (ref 0.1–1.0)
Monocytes Relative: 14 %
NEUTROS PCT: 65 %
Neutro Abs: 2.2 10*3/uL (ref 1.7–7.7)
PLATELETS: 55 10*3/uL — AB (ref 150–400)
RBC: 3.4 MIL/uL — AB (ref 4.22–5.81)
RDW: 14.1 % (ref 11.5–15.5)
WBC: 3.4 10*3/uL — AB (ref 4.0–10.5)

## 2016-07-19 MED ORDER — TEMOZOLOMIDE 140 MG PO CAPS: 150.0000 mg/m2/d | ORAL_CAPSULE | Freq: Every day | ORAL | 1 refills | Status: DC

## 2016-07-19 NOTE — Assessment & Plan Note (Addendum)
Right parietal lobe Glioblastoma Multiforme, grade 4, unknown methylation status of MGMT, S/P partial resection on 10/25/2015 by Dr. Owens Shark with removal of 80% of tumor followed by concomitant Temodar + XRT.  He started his first cycle of adjuvant temozolomide on 02/21/2016.  Future cycles werre complicated by Keppra-induced thrombocytopenia (without documented seizure activity) and therefore, Keppra was discontinued on 04/27/2016 with significant improvement in platelet count and dose escalation of Temodar to 200 mg/m2.  Labs today: CBC diff, CMET.  I personally reviewed and went over laboratory results with the patient.  The results are noted within this dictation.  Thrombocytopenia is noted.  Temodar will be held until platelet recovers.  Labs in 1 week: CBC diff.  Will decrease dose of Temodar to 150 mg/m2 due to intolerance and cytopenia.  New Rx is printed and given to Angie.  PharmD is notified.  Labs in 3 weeks: CBC diff, CMET.   He is on PROTONIX.  Prilosec was discontinued.  Return in 3 weeks for follow-up.

## 2016-07-19 NOTE — Patient Instructions (Signed)
[place] at [facility] Discharge Instructions  RECOMMENDATIONS MADE BY THE CONSULTANT AND ANY TEST RESULTS WILL BE SENT TO YOUR REFERRING PHYSICIAN.  You were seen today by Kirby Crigler PA-C. Decrease you Temodar dose to 150mg /m2, new prescription printed.  Return in 1 week for labs. Return in 3 weeks for follow up and labs.  Recommendations for sleep: Unis[place] Melatonin Benadryl  Thank you for choosing [place] at [facility] to provide your oncology and hematology care.  To afford each patient quality time with our provider, please arrive at least 15 minutes before your scheduled appointment time.   Beginning January 23rd 2017 lab work for the [facility] will be done in the  Main lab at [facility] on 1st floor. If you have a lab appointment with the [place] please come in thru the  Main Entrance and check in at the main information desk  You need to re-schedule your appointment should you arrive 10 or more minutes late.  We strive to give you quality time with our providers, and arriving late affects you and other patients whose appointments are after yours.  Also, if you no show three or more times for appointments you may be dismissed from the clinic at the providers discretion.     Again, thank you for choosing [facility].  Our hope is that these requests will decrease the amount of time that you wait before being seen by our physicians.       _____________________________________________________________  Should you have questions after your visit to [facility], please contact our office at [phone] between the hours of 8:30 a.m. and 4:30 p.m.  Voicemails left after 4:30 p.m. will not be returned until the following business day.  For prescription refill requests, have your pharmacy contact our office.         Resources For Cancer Patients and their Caregivers ? American Cancer  Society: Can assist with transportation, wigs, general needs, runs Look Good Feel Better.        404-819-8638 ? Cancer Care: Provides financial assistance, online support groups, medication/co-pay assistance.  1-800-813-HOPE 3804273086) ? [facility] Assists [place] cancer patients and their families through emotional , educational and financial support.  762-568-7650 ? [place] DSS Where to apply for food stamps, Medicaid and utility assistance. 4247247008 ? RCATS: Transportation to medical appointments. (619) 513-0488 ? Social Security Administration: May apply for disability if have a Stage IV cancer. (513)252-6678 249-191-4693 ? [employer], Disability and Transit Services: Assists with nutrition, care and transit needs. [place] Support Programs: @10RELATIVEDAYS @ > Cancer Support Group  2nd Tuesday of the month 1pm-2pm, Journey Room  > Creative Journey  3rd Tuesday of the month 1130am-1pm, Journey Room  > Look Good Feel Better  1st Wednesday of the month 10am-12 noon, Journey Room (Call [place] to register [phone])

## 2016-07-19 NOTE — Progress Notes (Signed)
Phillip Mckay, [place] [id number]  Glioblastoma multiforme of brain [facility]) - Plan: CBC with Differential, Comprehensive metabolic panel, CBC with Differential, CBC with Differential, Comprehensive metabolic panel  Malignant glioma of brain (HCC) - Plan: temozolomide (TEMODAR) 140 MG capsule  CURRENT THERAPY: Temodar 200 mg/m2  INTERVAL HISTORY: Phillip Mckay 75 y.o. 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 Owens Shark at [facility].  S/P Temodar with concurrent XRT and began adjuvant Temodar on [id number], complicated by Keppra-induced thrombocytopenia (resolved with discontinuation of Keppra).  He notes increased fatigue with increased dose of Temodar.  He notes otherwise, he is doing well.    He notes that his GERD is well controlled now on Protonix.  There was some confusion regarding Prilosec versus Protonix.  He is on Protonix.  Review of Systems  Constitutional: Negative.  Negative for chills, fever and weight loss.  HENT: Negative.   Eyes: Negative.   Respiratory: Negative.   Cardiovascular: Negative.   Gastrointestinal: Negative.   Genitourinary: Negative.   Musculoskeletal: Negative.   Skin: Negative.   Neurological: Negative.   Endo/Heme/Allergies: Negative.   Psychiatric/Behavioral: Negative.     Past Medical History:  Diagnosis Date  . Brain cancer ([place])   . Diabetes mellitus without complication ([place])   . Glioblastoma multiforme of brain ([place]) 10/27/2015    Past Surgical History:  Procedure Laterality Date  . CHOLECYSTECTOMY      Family History  Problem Relation Age of Onset  . Cancer Mother   . Diabetes Mother   . Cancer Father   . Cancer Brother     Social History   Social History  . Marital status: Married    Spouse name: N/A  . Number of children: N/A  . Years of education: N/A    Social History Main Topics  . Smoking status: Former Research scientist (life sciences)  . Smokeless tobacco: Never Used  . Alcohol use No  . Drug use: No  . Sexual activity: No   Other Topics Concern  . None   Social History Narrative  . None     PHYSICAL EXAMINATION  ECOG PERFORMANCE STATUS: 1 - Symptomatic but completely ambulatory  Vitals:   07/19/16 1310  BP: 120/69  Pulse: 86  Resp: 16  Temp: 98.3 F (36.8 C)    GENERAL:alert, no distress, comfortable, cooperative, smiling and accompanied by his wife. SKIN: skin color, texture, turgor are normal, no rashes or significant lesions HEAD: Normocephalic, No masses, lesions, tenderness or abnormalities EYES: normal, EOMI, Conjunctiva are pink and non-injected EARS: External ears normal OROPHARYNX:lips, buccal mucosa, and tongue normal and mucous membranes are moist  NECK: supple, trachea midline LYMPH:  no palpable lymphadenopathy BREAST:not examined LUNGS: clear to auscultation  HEART: regular rate & rhythm ABDOMEN:abdomen soft and normal bowel sounds BACK: Back symmetric, no curvature. EXTREMITIES:less then 2 second capillary refill, no joint deformities, effusion, or inflammation, no skin discoloration, no cyanosis  NEURO: alert & oriented x 3 with fluent speech, no focal motor/sensory deficits, gait normal   LABORATORY DATA: CBC    Component Value Date/Time   WBC 3.4 (L) 07/19/2016 1231   RBC 3.40 (L) 07/19/2016 1231   HGB 11.3 (L) 07/19/2016 1231   HCT 32.7 (L) 07/19/2016 1231   PLT 55 (L) 07/19/2016 1231   MCV 96.2 07/19/2016 1231   MCH 33.2 07/19/2016  1231   MCHC 34.6 07/19/2016 1231   RDW 14.1 07/19/2016 1231   LYMPHSABS 0.6 (L) 07/19/2016 1231   MONOABS 0.5 07/19/2016 1231   EOSABS 0.1 07/19/2016 1231   BASOSABS 0.0 07/19/2016 1231      Chemistry      Component Value Date/Time   NA 141 07/19/2016 1231   K 3.7 07/19/2016 1231   CL 108 07/19/2016 1231   CO2 27 07/19/2016 1231   BUN 10 07/19/2016 1231   CREATININE  0.88 07/19/2016 1231      Component Value Date/Time   CALCIUM 9.4 07/19/2016 1231   ALKPHOS 30 (L) 07/19/2016 1231   AST 24 07/19/2016 1231   ALT 15 (L) 07/19/2016 1231   BILITOT 0.8 07/19/2016 1231        PENDING LABS:   RADIOGRAPHIC STUDIES:  No results found.   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 Owens Shark with removal of 80% of tumor followed by concomitant Temodar + XRT.  He started his first cycle of adjuvant temozolomide on 02/21/2016.  Future cycles werre complicated by Keppra-induced thrombocytopenia (without documented seizure activity) and therefore, Keppra was discontinued on 04/27/2016 with significant improvement in platelet count and dose escalation of Temodar to 200 mg/m2.  Labs today: CBC diff, CMET.  I personally reviewed and went over laboratory results with the patient.  The results are noted within this dictation.  Thrombocytopenia is noted.  Temodar will be held until platelet recovers.  Labs in 1 week: CBC diff.  Will decrease dose of Temodar to 150 mg/m2 due to intolerance and cytopenia.  New Rx is printed and given to Angie.  PharmD is notified.  Labs in 3 weeks: CBC diff, CMET.   He is on PROTONIX.  Prilosec was discontinued.  Return in 3 weeks for follow-up.    ORDERS PLACED FOR THIS ENCOUNTER: Orders Placed This Encounter  Procedures  . CBC with Differential  . Comprehensive metabolic panel  . CBC with Differential  . CBC with Differential  . Comprehensive metabolic panel    MEDICATIONS PRESCRIBED THIS ENCOUNTER: Meds ordered this encounter  Medications  . B Complex-C ([facility] B COMPLEX PLUS C) TABS    Sig: Take by mouth daily.    Refill:  2  . DISCONTD: [facility] VITAMIN D 2000 units CAPS    Sig: TAKE 1 TABLET (2,000 UNITS TOTAL) BY MOUTH DAILY.    Refill:  3  . omeprazole  (PRILOSEC) 20 MG capsule  . temozolomide (TEMODAR) 140 MG capsule    Sig: Take 2 capsules (280 mg total) by mouth daily. May take on an empty stomach or at bedtime to decrease nausea & vomiting. TAKE for 5 days every 28 days    Dispense:  10 capsule    Refill:  1    Order Specific Question:   Supervising Provider    Answer:   Patrici Ranks [id number]    THERAPY PLAN:  Will restart Temodar at a decreased dose when platelet count recovers.  All questions were answered. The patient knows to call the clinic with any problems, questions or concerns. We can certainly see the patient much sooner if necessary.  Patient and plan discussed with Dr. Ancil Linsey and she is in agreement with the aforementioned.   This note is electronically signed by: Doy Mince 07/19/2016 7:11 PM

## 2016-07-19 NOTE — Telephone Encounter (Signed)
Faxed new temodar script to [facility]. Dose change

## 2016-07-25 ENCOUNTER — Encounter (HOSPITAL_COMMUNITY): Payer: Medicare Other

## 2016-07-25 ENCOUNTER — Telehealth (HOSPITAL_COMMUNITY): Payer: Self-pay

## 2016-07-25 ENCOUNTER — Ambulatory Visit (HOSPITAL_COMMUNITY)
Admission: RE | Admit: 2016-07-25 | Discharge: 2016-07-25 | Disposition: A | Discharge status: Home / Self Care | Payer: Medicare Other | Source: Ambulatory Visit | Attending: Oncology | Admitting: Oncology

## 2016-07-25 ENCOUNTER — Other Ambulatory Visit (HOSPITAL_COMMUNITY): Payer: Self-pay | Admitting: Oncology

## 2016-07-25 DIAGNOSIS — C719 Malignant neoplasm of brain, unspecified: Secondary | ICD-10-CM

## 2016-07-25 DIAGNOSIS — M7989 Other specified soft tissue disorders: Secondary | ICD-10-CM

## 2016-07-25 LAB — CBC WITH DIFFERENTIAL/PLATELET
BASOS ABS: 0 10*3/uL (ref 0.0–0.1)
BASOS PCT: 0 %
EOS ABS: 0.1 10*3/uL (ref 0.0–0.7)
EOS PCT: 2 %
HEMATOCRIT: 31.7 % — AB (ref 39.0–52.0)
Hemoglobin: 11.1 g/dL — ABNORMAL LOW (ref 13.0–17.0)
Lymphocytes Relative: 20 %
Lymphs Abs: 0.7 10*3/uL (ref 0.7–4.0)
MCH: 33.5 pg (ref 26.0–34.0)
MCHC: 35 g/dL (ref 30.0–36.0)
MCV: 95.8 fL (ref 78.0–100.0)
MONO ABS: 0.4 10*3/uL (ref 0.1–1.0)
MONOS PCT: 11 %
Neutro Abs: 2.3 10*3/uL (ref 1.7–7.7)
Neutrophils Relative %: 67 %
PLATELETS: 96 10*3/uL — AB (ref 150–400)
RBC: 3.31 MIL/uL — ABNORMAL LOW (ref 4.22–5.81)
RDW: 14.4 % (ref 11.5–15.5)
WBC: 3.4 10*3/uL — ABNORMAL LOW (ref 4.0–10.5)

## 2016-07-25 NOTE — Telephone Encounter (Signed)
Patients wife called stating Phillip Mckay legs were swelling more than usual and staying swollen. Patient will not keep his legs up as much as he should. Reviewed with PA, who placed new orders. Called wife back and explained patient would need test on his legs and a 2D echo. We would call her with appointments. alos encouraged patient to keep his feet elevated.

## 2016-07-26 ENCOUNTER — Other Ambulatory Visit (HOSPITAL_COMMUNITY): Payer: Medicare Other

## 2016-07-27 ENCOUNTER — Other Ambulatory Visit (HOSPITAL_COMMUNITY): Payer: Self-pay | Admitting: Oncology

## 2016-07-27 ENCOUNTER — Other Ambulatory Visit (HOSPITAL_COMMUNITY): Payer: Self-pay | Admitting: Pharmacist

## 2016-07-27 ENCOUNTER — Encounter (HOSPITAL_BASED_OUTPATIENT_CLINIC_OR_DEPARTMENT_OTHER): Payer: Medicare Other

## 2016-07-27 ENCOUNTER — Encounter: Payer: Self-pay | Admitting: Dietician

## 2016-07-27 ENCOUNTER — Telehealth (HOSPITAL_COMMUNITY): Payer: Self-pay | Admitting: *Deleted

## 2016-07-27 DIAGNOSIS — C719 Malignant neoplasm of brain, unspecified: Secondary | ICD-10-CM | POA: Diagnosis present

## 2016-07-27 DIAGNOSIS — R638 Other symptoms and signs concerning food and fluid intake: Secondary | ICD-10-CM

## 2016-07-27 MED ORDER — SODIUM CHLORIDE 0.9 % IV SOLN
10.0000 mg | Freq: Once | INTRAVENOUS | Status: DC
  Filled 2016-07-27: qty 1

## 2016-07-27 MED ORDER — SODIUM CHLORIDE 0.9 % IV SOLN: INTRAVENOUS | Status: DC

## 2016-07-27 MED ORDER — DEXAMETHASONE SODIUM PHOSPHATE 100 MG/10ML IJ SOLN
10.0000 mg | Freq: Once | INTRAMUSCULAR | Status: AC
  Administered 2016-07-27: 10 mg via INTRAVENOUS
  Filled 2016-07-27: qty 1

## 2016-07-27 MED ORDER — SODIUM CHLORIDE 0.9 % IV SOLN
INTRAVENOUS | Status: AC
  Administered 2016-07-27: 13:00:00 via INTRAVENOUS

## 2016-07-27 NOTE — Progress Notes (Signed)
Late entry.  I spoke with Phillip Mckay and his lovely wife on 07/25/2016  before his visit. We speak often, as often as weekly.  Phillip Mckay is doing well and he has just picked up his prescription for Temodar at reduced dosing of two tablets of 140 mg (280mg  total dose) days 1-5 every 28 days. He is compliant with his medication.  He has some nausea from his previous dosage and also has thrombocytopenia, thus necessitating the dose reduction.  He has no issues with obtaing his medications from [employer] and I assist them with this process as needed.

## 2016-07-27 NOTE — Telephone Encounter (Signed)
Pt's wife called stating that she gave the pt a pain like advised yesterday for eye pain but the pt has had no relief. She states that the pt has a headache that will not go away along with the eye pain. She states that she can not get the pt to eat or drink anything, and she has trouble since his last visit. She states that he is not going to void very often and his mouth is dry.  I spoke with Kirby Crigler PA-C and he advised that the pt would need to come in for fluids today. I spoke with Olivia Mackie charge nurse and she advised that the pt could be worked into the schedule before 12.   I advised the pt's wife to bring the pt on to the clinic for fluids. I went to Amy appointment scheduler and asked her to add pt to schedule. Pt was added and is on his way here to get fluids.

## 2016-07-27 NOTE — Patient Instructions (Signed)
[place] at [facility] Discharge Instructions  RECOMMENDATIONS MADE BY THE CONSULTANT AND ANY TEST RESULTS WILL BE SENT TO YOUR REFERRING PHYSICIAN.  IV fluids today. Return as scheduled.  Thank you for choosing [place] at [facility] to provide your oncology and hematology care.  To afford each patient quality time with our provider, please arrive at least 15 minutes before your scheduled appointment time.   Beginning January 23rd 2017 lab work for the [facility] will be done in the  Main lab at [facility] on 1st floor. If you have a lab appointment with the [place] please come in thru the  Main Entrance and check in at the main information desk  You need to re-schedule your appointment should you arrive 10 or more minutes late.  We strive to give you quality time with our providers, and arriving late affects you and other patients whose appointments are after yours.  Also, if you no show three or more times for appointments you may be dismissed from the clinic at the providers discretion.     Again, thank you for choosing [facility].  Our hope is that these requests will decrease the amount of time that you wait before being seen by our physicians.       _____________________________________________________________  Should you have questions after your visit to [facility], please contact our office at [phone] between the hours of 8:30 a.m. and 4:30 p.m.  Voicemails left after 4:30 p.m. will not be returned until the following business day.  For prescription refill requests, have your pharmacy contact our office.         Resources For Cancer Patients and their Caregivers ? American Cancer Society: Can assist with transportation, wigs, general needs, runs Look Good Feel Better.        [phone] ? Cancer Care: Provides financial assistance, online support groups, medication/co-pay  assistance.  1-800-813-HOPE 819-598-9553) ? [place] Assists [place] cancer patients and their families through emotional , educational and financial support.  (949)546-9228 ? [place] DSS Where to apply for food stamps, Medicaid and utility assistance. 602-723-5299 ? RCATS: Transportation to medical appointments. 9850623267 ? Social Security Administration: May apply for disability if have a Stage IV cancer. (670)117-4272 (250) 469-9775 ? [employer], Disability and Transit Services: Assists with nutrition, care and transit needs. [place] Support Programs: @10RELATIVEDAYS @ > Cancer Support Group  2nd Tuesday of the month 1pm-2pm, Journey Room  > Creative Journey  3rd Tuesday of the month 1130am-1pm, Journey Room  > Look Good Feel Better  1st Wednesday of the month 10am-12 noon, Journey Room (Call [place] to register [phone])

## 2016-07-27 NOTE — Progress Notes (Signed)
Tolerated IV fluids well. Stable on discharge home with family via wheelchair. 

## 2016-07-27 NOTE — Progress Notes (Signed)
RD consulted by PA due to pt's poor PO intake. Pt had needed to come in for IVF due to poor PO intake.   Pt has history of glioblastoma multiforme, being treated with Temodar.   Contacted Pt by visiting while he was receiving IV fluids. Pt's wife and daughter also present.   Wt Readings from Last 10 Encounters:  07/19/16 170 lb 3.2 oz (77.2 kg)  07/02/16 169 lb 9.6 oz (76.9 kg)  06/21/16 170 lb 1.6 oz (77.2 kg)  06/14/16 173 lb 1.6 oz (78.5 kg)  05/17/16 183 lb 1.6 oz (83.1 kg)  05/04/16 179 lb 1.6 oz (81.2 kg)  04/27/16 181 lb 1.6 oz (82.1 kg)  04/20/16 179 lb 11.2 oz (81.5 kg)  04/02/16 179 lb 1.6 oz (81.2 kg)   Patient weight has decreased by ~10 lbs in the last two months. He was not weighed today.  Per family report, pt has had very minimal PO intake this last week. He would only drink diet gingerale/sprite or V8 juice. Daughter states that he "probably was drinking <14 oz fluid/day". He would eat only spoonfuls of food.   Patient himself reports that he has a complete aversion to foods. He says smells and even simply seeing the foods make him sick/nauseous. Additionally, he has taste changes that greatly fluctuate: "the foods that are supposed to be sweet taste salty and the foods that are supposed to be salty taste sweet". His taste changes do not seem to follow a pattern. He reports metallic tastes as well. He could not state one food that "tastes normal".   He has sporadic cravings for certain food items, but will only eat a small amount before feeling sick. His zofran is effective at battling the nausea, but says he still has the taste changes and poor appetite.   RD first recommended that he switch to regular sodas for the kcals. He drinks diet due to his DM, but per family his BG is always <100. Daughter thought patient was not allowed to have sports drinks "due to them being bad for your heart". Reccommended not drinking the V8 due to the very high sodium content.  RD asked if  he could try different spices or seasoning. He says he is intolerant to these, but does not he adds hot sauce to foods. RD asked to try to add his hot sauce to other items to mask altered tastes. Also recommended bbq sauce. Encouraged to walk down spice aisle and find ones that smell good to him.   Secondly, RD strongly recommended patient look into different nutritional supplements. He does not like Ensure or Boost due to the consistency. He was unaware of the clear versions. He is given samples of Breeze and Ensure Clear today. He is told cases of Ensure are available for free if he likes it. Discussed that most of the beverages he is drinking have no protein. These supplements do have protein.   He has never been on an appetite stimulant. He has been on steroids in the past and said these made him "hungry enough to eat the buttons off my shirt", but is not on them currently. Patient may be appropriate for one.   Maintaining his hydration will be very difficult as he says water makes his stomach hurt most of the time. Again, all of his symptoms are transient and occasionally he will be able to eat or drink normally. He is advised to always have beverages nearby to remind himself to drink fluids. If  he continues to have this severe of aversion to food/drinks, he may need scheduled IVF depending on the etiology of his aversion.   He says he does not have constipation or diarrhea.   Despite his poor condition, Pt seemed to be in a very positive mood. He was accepting of recommendations.   Left my contact info, coupons, samples, handouts titled "Taste and Smell Changes" + "Dehydration", and a list of oral nutritional supplements with information on where they can be purchased.    Findings discussed with PA  Will follow up with patient at his next appt.   Burtis Junes RD, LDN, [facility] Pager: [phone] 07/27/2016 1:33 PM

## 2016-07-27 NOTE — Progress Notes (Signed)
N/s

## 2016-08-01 ENCOUNTER — Ambulatory Visit (HOSPITAL_COMMUNITY)
Admission: RE | Admit: 2016-08-01 | Discharge: 2016-08-01 | Disposition: A | Discharge status: Home / Self Care | Payer: Medicare Other | Source: Ambulatory Visit | Attending: Oncology | Admitting: Oncology

## 2016-08-01 ENCOUNTER — Encounter (HOSPITAL_COMMUNITY): Payer: Medicare Other

## 2016-08-01 ENCOUNTER — Other Ambulatory Visit (HOSPITAL_COMMUNITY): Payer: Self-pay | Admitting: Oncology

## 2016-08-01 DIAGNOSIS — E785 Hyperlipidemia, unspecified: Secondary | ICD-10-CM | POA: Diagnosis not present

## 2016-08-01 DIAGNOSIS — Z87891 Personal history of nicotine dependence: Secondary | ICD-10-CM | POA: Insufficient documentation

## 2016-08-01 DIAGNOSIS — E119 Type 2 diabetes mellitus without complications: Secondary | ICD-10-CM | POA: Diagnosis not present

## 2016-08-01 DIAGNOSIS — I071 Rheumatic tricuspid insufficiency: Secondary | ICD-10-CM | POA: Diagnosis not present

## 2016-08-01 DIAGNOSIS — D332 Benign neoplasm of brain, unspecified: Secondary | ICD-10-CM | POA: Insufficient documentation

## 2016-08-01 DIAGNOSIS — D333 Benign neoplasm of cranial nerves: Secondary | ICD-10-CM | POA: Diagnosis not present

## 2016-08-01 DIAGNOSIS — E876 Hypokalemia: Secondary | ICD-10-CM

## 2016-08-01 DIAGNOSIS — M7989 Other specified soft tissue disorders: Secondary | ICD-10-CM | POA: Insufficient documentation

## 2016-08-01 DIAGNOSIS — I119 Hypertensive heart disease without heart failure: Secondary | ICD-10-CM | POA: Diagnosis not present

## 2016-08-01 DIAGNOSIS — C719 Malignant neoplasm of brain, unspecified: Secondary | ICD-10-CM | POA: Diagnosis not present

## 2016-08-01 LAB — ECHOCARDIOGRAM COMPLETE
CHL CUP DOP CALC LVOT VTI: 23.9 cm
CHL CUP MV DEC (S): 239
CHL CUP RV SYS PRESS: 25 mmHg
CHL CUP STROKE VOLUME: 35 mL
CHL CUP TV REG PEAK VELOCITY: 235 cm/s
E decel time: 239 msec
EERAT: 13.87
FS: 22 % — AB (ref 28–44)
IVS/LV PW RATIO, ED: 0.99
LA ID, A-P, ES: 25 mm
LADIAMINDEX: 1.3 cm/m2
LAVOL: 50.5 mL
LAVOLA4C: 38 mL
LAVOLIN: 26.2 mL/m2
LEFT ATRIUM END SYS DIAM: 25 mm
LV E/e' medial: 13.87
LV SIMPSON'S DISK: 51
LV TDI E'MEDIAL: 4.35
LV dias vol index: 35 mL/m2
LV dias vol: 68 mL (ref 62–150)
LV sys vol index: 17 mL/m2
LV sys vol: 33 mL (ref 21–61)
LVEEAVG: 13.87
LVELAT: 5.87 cm/s
LVOT area: 2.84 cm2
LVOT peak grad rest: 4 mmHg
LVOT peak vel: 106 cm/s
LVOTD: 19 mm
LVOTSV: 68 mL
Lateral S' vel: 13.2 cm/s
MV pk A vel: 104 m/s
MVPG: 3 mmHg
MVPKEVEL: 81.4 m/s
PW: 9.48 mm — AB (ref 0.6–1.1)
RV TAPSE: 17.7 mm
TDI e' lateral: 5.87
TRMAXVEL: 235 cm/s

## 2016-08-01 LAB — CBC WITH DIFFERENTIAL/PLATELET
BASOS ABS: 0 10*3/uL (ref 0.0–0.1)
Basophils Relative: 0 %
EOS ABS: 0.1 10*3/uL (ref 0.0–0.7)
EOS PCT: 2 %
HCT: 32.3 % — ABNORMAL LOW (ref 39.0–52.0)
Hemoglobin: 11.2 g/dL — ABNORMAL LOW (ref 13.0–17.0)
Lymphocytes Relative: 14 %
Lymphs Abs: 0.5 10*3/uL — ABNORMAL LOW (ref 0.7–4.0)
MCH: 33.4 pg (ref 26.0–34.0)
MCHC: 34.7 g/dL (ref 30.0–36.0)
MCV: 96.4 fL (ref 78.0–100.0)
MONO ABS: 0.4 10*3/uL (ref 0.1–1.0)
Monocytes Relative: 12 %
Neutro Abs: 2.6 10*3/uL (ref 1.7–7.7)
Neutrophils Relative %: 72 %
PLATELETS: 151 10*3/uL (ref 150–400)
RBC: 3.35 MIL/uL — AB (ref 4.22–5.81)
RDW: 14.5 % (ref 11.5–15.5)
WBC: 3.6 10*3/uL — AB (ref 4.0–10.5)

## 2016-08-01 LAB — COMPREHENSIVE METABOLIC PANEL
ALT: 18 U/L (ref 17–63)
AST: 25 U/L (ref 15–41)
Albumin: 3.6 g/dL (ref 3.5–5.0)
Alkaline Phosphatase: 33 U/L — ABNORMAL LOW (ref 38–126)
Anion gap: 6 (ref 5–15)
BUN: 9 mg/dL (ref 6–20)
CHLORIDE: 105 mmol/L (ref 101–111)
CO2: 29 mmol/L (ref 22–32)
CREATININE: 0.83 mg/dL (ref 0.61–1.24)
Calcium: 9.1 mg/dL (ref 8.9–10.3)
Glucose, Bld: 106 mg/dL — ABNORMAL HIGH (ref 65–99)
POTASSIUM: 3 mmol/L — AB (ref 3.5–5.1)
SODIUM: 140 mmol/L (ref 135–145)
Total Bilirubin: 1 mg/dL (ref 0.3–1.2)
Total Protein: 5.9 g/dL — ABNORMAL LOW (ref 6.5–8.1)

## 2016-08-01 MED ORDER — POTASSIUM CHLORIDE CRYS ER 20 MEQ PO TBCR: 40.0000 meq | EXTENDED_RELEASE_TABLET | Freq: Every day | ORAL | 1 refills | Status: DC

## 2016-08-01 NOTE — Progress Notes (Signed)
*  PRELIMINARY RESULTS* Echocardiogram 2D Echocardiogram has been performed.  Phillip Mckay 08/01/2016, 11:23 AM

## 2016-08-10 ENCOUNTER — Encounter (HOSPITAL_BASED_OUTPATIENT_CLINIC_OR_DEPARTMENT_OTHER): Payer: Medicare Other | Admitting: Oncology

## 2016-08-10 ENCOUNTER — Encounter: Payer: Self-pay | Admitting: Dietician

## 2016-08-10 ENCOUNTER — Encounter (HOSPITAL_COMMUNITY): Payer: Self-pay | Admitting: Oncology

## 2016-08-10 ENCOUNTER — Encounter (HOSPITAL_COMMUNITY): Payer: Medicare Other

## 2016-08-10 DIAGNOSIS — C719 Malignant neoplasm of brain, unspecified: Secondary | ICD-10-CM

## 2016-08-10 LAB — COMPREHENSIVE METABOLIC PANEL
ALBUMIN: 3.7 g/dL (ref 3.5–5.0)
ALK PHOS: 33 U/L — AB (ref 38–126)
ALT: 17 U/L (ref 17–63)
AST: 26 U/L (ref 15–41)
Anion gap: 5 (ref 5–15)
BUN: 7 mg/dL (ref 6–20)
CALCIUM: 9.4 mg/dL (ref 8.9–10.3)
CHLORIDE: 107 mmol/L (ref 101–111)
CO2: 28 mmol/L (ref 22–32)
CREATININE: 1 mg/dL (ref 0.61–1.24)
GFR calc Af Amer: >60 mL/min (ref >60)
GFR calc non Af Amer: >60 mL/min (ref >60)
GLUCOSE: 91 mg/dL (ref 65–99)
Potassium: 4.1 mmol/L (ref 3.5–5.1)
SODIUM: 140 mmol/L (ref 135–145)
Total Bilirubin: 1 mg/dL (ref 0.3–1.2)
Total Protein: 6 g/dL — ABNORMAL LOW (ref 6.5–8.1)

## 2016-08-10 LAB — CBC WITH DIFFERENTIAL/PLATELET
BASOS ABS: 0 10*3/uL (ref 0.0–0.1)
BASOS PCT: 1 %
EOS ABS: 0 10*3/uL (ref 0.0–0.7)
Eosinophils Relative: 1 %
HCT: 32.5 % — ABNORMAL LOW (ref 39.0–52.0)
HEMOGLOBIN: 11.1 g/dL — AB (ref 13.0–17.0)
Lymphocytes Relative: 15 %
Lymphs Abs: 0.6 10*3/uL — ABNORMAL LOW (ref 0.7–4.0)
MCH: 33.2 pg (ref 26.0–34.0)
MCHC: 34.2 g/dL (ref 30.0–36.0)
MCV: 97.3 fL (ref 78.0–100.0)
Monocytes Absolute: 0.4 10*3/uL (ref 0.1–1.0)
Monocytes Relative: 10 %
NEUTROS PCT: 74 %
Neutro Abs: 2.8 10*3/uL (ref 1.7–7.7)
PLATELETS: 117 10*3/uL — AB (ref 150–400)
RBC: 3.34 MIL/uL — AB (ref 4.22–5.81)
RDW: 15.4 % (ref 11.5–15.5)
WBC: 3.8 10*3/uL — AB (ref 4.0–10.5)

## 2016-08-10 MED ORDER — CLOTRIMAZOLE-BETAMETHASONE 1-0.05 % EX CREA: 1.0000 "application " | TOPICAL_CREAM | Freq: Two times a day (BID) | CUTANEOUS | 0 refills | Status: AC

## 2016-08-10 NOTE — Assessment & Plan Note (Addendum)
Right parietal lobe Glioblastoma Multiforme, grade 4, unknown methylation status of MGMT, S/P partial resection on 10/25/2015 by Dr. Owens Shark with removal of 80% of tumor followed by concomitant Temodar + XRT.  He started his first cycle of adjuvant temozolomide on 02/21/2016.  Future cycles werre complicated by Keppra-induced thrombocytopenia (without documented seizure activity) and therefore, Keppra was discontinued on 04/27/2016 with significant improvement in platelet count and dose escalation of Temodar to 200 mg/m2.  However, performance status and blood counts declined requiring a change back to 150 mg/m2 of Temodar on 08/02/2016.  Labs today: CBC diff, CMET.  I personally reviewed and went over laboratory results with the patient.  The results are noted within this dictation.    Weight is down 2 lbs over the past 4 weeks.  Burtis Junes, RD met with the patient today and provided him supplemental drinks.  The patient reports a good appetite and eats well.  Therefore, appetite stimulant therapy was not pursued today.  We discussed the patient hydration status.  His wife thinks he needs to drink more H2O.  The patient is encouraged to drink more.  He is provided tips and tricks to increase his H2O intake.  He shows a well demarcated, erythematous rash in the web of his #1 and #2 toes and spreading dorsally on the left foot.  I suspect interdigital tinea pedis.  Rx is provided for Lotrisone cream.  He does have B/L pedal edema.  Labs in 2 weeks: CBC diff, CMET.  He is on PROTONIX.  Prilosec was discontinued.  Return in 2 weeks for follow-up.

## 2016-08-10 NOTE — Patient Instructions (Signed)
[place] at [facility] Discharge Instructions  RECOMMENDATIONS MADE BY THE CONSULTANT AND ANY TEST RESULTS WILL BE SENT TO YOUR REFERRING PHYSICIAN.  You were seen by Phillip Mckay today  Return as scheduled  labs today as scheduled  Increase Fluids Prescription printed  Thank you for choosing [place] at [facility] to provide your oncology and hematology care.  To afford each patient quality time with our provider, please arrive at least 15 minutes before your scheduled appointment time.   Beginning January 23rd 2017 lab work for the [facility] will be done in the  Main lab at [facility] on 1st floor. If you have a lab appointment with the [facility] please come in thru the  Main Entrance and check in at the main information desk  You need to re-schedule your appointment should you arrive 10 or more minutes late.  We strive to give you quality time with our providers, and arriving late affects you and other patients whose appointments are after yours.  Also, if you no show three or more times for appointments you may be dismissed from the clinic at the providers discretion.     Again, thank you for choosing [facility].  Our hope is that these requests will decrease the amount of time that you wait before being seen by our physicians.       _____________________________________________________________  Should you have questions after your visit to [facility], please contact our office at [phone] between the hours of 8:30 a.m. and 4:30 p.m.  Voicemails left after 4:30 p.m. will not be returned until the following business day.  For prescription refill requests, have your pharmacy contact our office.         Resources For Cancer Patients and their Caregivers ? American Cancer Society: Can assist with transportation, wigs, general needs, runs Look Good Feel Better.        801-460-6969 ? Cancer  Care: Provides financial assistance, online support groups, medication/co-pay assistance.  1-800-813-HOPE 432 336 9676) ? [place] Assists [place] cancer patients and their families through emotional , educational and financial support.  806-689-8864 ? [place] DSS Where to apply for food stamps, Medicaid and utility assistance. 907-512-5439 ? RCATS: Transportation to medical appointments. 606-147-6544 ? Social Security Administration: May apply for disability if have a Stage IV cancer. [phone] ? [employer], Disability and Transit Services: Assists with nutrition, care and transit needs. [place] Support Programs: @10RELATIVEDAYS @ > Cancer Support Group  2nd Tuesday of the month 1pm-2pm, Journey Room  > Creative Journey  3rd Tuesday of the month 1130am-1pm, Journey Room  > Look Good Feel Better  1st Wednesday of the month 10am-12 noon, Journey Room (Call [place] to register [phone])

## 2016-08-10 NOTE — Progress Notes (Signed)
Phillip Burly, MD [place] [address]  Glioblastoma multiforme of brain [facility])  CURRENT THERAPY: Temodar 150 mg/m2  INTERVAL HISTORY: Phillip Mckay 75 y.o. 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 Owens Mckay at [facility].  S/P Temodar with concurrent XRT and began adjuvant Temodar on 2/0/2542, complicated by Keppra-induced thrombocytopenia (resolved with discontinuation of Keppra).    Glioblastoma multiforme of brain ([place])   07/19/2016 Treatment Plan Change    Dose of Temodar reduced to 150 mg/m2      07/19/2016 Adverse Reaction    Weight loss, poor tolerance, thrombocytopenia secondary to Temodar 200 mg/m2      07/25/2016 Treatment Plan Change    Treatment held x 1 week      07/25/2016 Imaging    [place] LE B/L- No evidence of DVT within either lower extremity.      08/01/2016 Echocardiogram    Left ventricle: The cavity size was normal. Wall thickness was normal. Systolic function was normal. The estimated ejection fraction was in the range of 50% to 55%. Wall motion was normal; there were no regional wall motion abnormalities. Doppler parameters are consistent with abnormal left ventricular relaxation (grade 1 diastolic dysfunction). Doppler parameters are consistent with high ventricular filling pressure. - Left atrium: The atrium was mildly dilated. - Tricuspid valve: There was mild regurgitation.      08/04/2016 -  Chemotherapy    Temodar 150 mg/m2 (beginning on 10/21)       He tolerated his dose reduction much better this treatment.  He notes that he feels better than he has lately.  His wife reports poor H2O intake.  His appetite is strong, but his weight is down 2 lbs compared to last month.  Phillip Mckay, RD saw him today.    He reports a rash on his foot for which he has been applying  hydrocortisone cream and neosporin.  He notes no improvement in this rash.  He notes that it is mildly pruritic.  Review of Systems  Constitutional: Positive for weight loss. Negative for chills and fever.  HENT: Negative for tinnitus.   Eyes: Negative.  Negative for blurred vision and double vision.  Respiratory: Negative.  Negative for cough.   Cardiovascular: Negative.  Negative for chest pain.  Gastrointestinal: Negative.  Negative for constipation, diarrhea, nausea and vomiting.  Genitourinary: Negative.   Musculoskeletal: Negative.   Skin: Positive for itching and rash.  Neurological: Negative.  Negative for headaches.  Endo/Heme/Allergies: Negative.   Psychiatric/Behavioral: Negative.     Past Medical History:  Diagnosis Date  . Brain cancer ([place])   . Diabetes mellitus without complication ([place])   . Glioblastoma multiforme of brain ([place]) 10/27/2015    Past Surgical History:  Procedure Laterality Date  . CHOLECYSTECTOMY      Family History  Problem Relation Age of Onset  . Cancer Mother   . Diabetes Mother   . Cancer Father   . Cancer Brother     Social History   Social History  . Marital status: Married    Spouse name: N/A  . Number of children: N/A  . Years of education: N/A   Social History Main Topics  . Smoking status: Former Research scientist (life sciences)  . Smokeless tobacco: Never Used  . Alcohol use No  . Drug use: No  . Sexual activity: No  Other Topics Concern  . None   Social History Narrative  . None     PHYSICAL EXAMINATION  ECOG PERFORMANCE STATUS: 2 - Symptomatic, <50% confined to bed  Vitals:   08/10/16 1100  BP: 121/83  Pulse: 85  Resp: 18  Temp: 98.2 F (36.8 C)    GENERAL:alert, no distress, comfortable, cooperative, smiling and accompanied by his wife, in wheelchair. SKIN: skin color, texture, turgor are normal, no rashes or significant lesions HEAD: Normocephalic, No masses, lesions, tenderness or abnormalities EYES: normal, EOMI,  Conjunctiva are pink and non-injected EARS: External ears normal OROPHARYNX:lips, buccal mucosa, and tongue normal and mucous membranes are moist  NECK: supple, trachea midline LYMPH:  no palpable lymphadenopathy BREAST:not examined LUNGS: clear to auscultation  HEART: regular rate & rhythm ABDOMEN:abdomen soft and normal bowel sounds BACK: Back symmetric, no curvature. EXTREMITIES:less then 2 second capillary refill, no joint deformities, effusion, or inflammation, no skin discoloration, no cyanosis  NEURO: alert & oriented x 3 with fluent speech, no focal motor/sensory deficits, gait normal   LABORATORY DATA: CBC    Component Value Date/Time   WBC 3.8 (L) 08/10/2016 1213   RBC 3.34 (L) 08/10/2016 1213   HGB 11.1 (L) 08/10/2016 1213   HCT 32.5 (L) 08/10/2016 1213   PLT 117 (L) 08/10/2016 1213   MCV 97.3 08/10/2016 1213   MCH 33.2 08/10/2016 1213   MCHC 34.2 08/10/2016 1213   RDW 15.4 08/10/2016 1213   LYMPHSABS 0.6 (L) 08/10/2016 1213   MONOABS 0.4 08/10/2016 1213   EOSABS 0.0 08/10/2016 1213   BASOSABS 0.0 08/10/2016 1213      Chemistry      Component Value Date/Time   NA 140 08/10/2016 1213   K 4.1 08/10/2016 1213   CL 107 08/10/2016 1213   CO2 28 08/10/2016 1213   BUN 7 08/10/2016 1213   CREATININE 1.00 08/10/2016 1213      Component Value Date/Time   CALCIUM 9.4 08/10/2016 1213   ALKPHOS 33 (L) 08/10/2016 1213   AST 26 08/10/2016 1213   ALT 17 08/10/2016 1213   BILITOT 1.0 08/10/2016 1213        PENDING LABS:   RADIOGRAPHIC STUDIES:  US Venous Img Lower Bilateral  Result Date: 07/25/2016 CLINICAL DATA:  Bilateral lower extremity pain and edema. Former smoker. History of brain cancer. Evaluate for DVT. EXAM: BILATERAL LOWER EXTREMITY VENOUS DOPPLER ULTRASOUND TECHNIQUE: Gray-scale sonography with graded compression, as well as color Doppler and duplex ultrasound were performed to evaluate the lower extremity deep venous systems from the level of the  common femoral vein and including the common femoral, femoral, profunda femoral, popliteal and calf veins including the posterior tibial, peroneal and gastrocnemius veins when visible. The superficial great saphenous vein was also interrogated. Spectral Doppler was utilized to evaluate flow at rest and with distal augmentation maneuvers in the common femoral, femoral and popliteal veins. COMPARISON:  None. FINDINGS: RIGHT LOWER EXTREMITY Common Femoral Vein: No evidence of thrombus. Normal compressibility, respiratory phasicity and response to augmentation. Saphenofemoral Junction: No evidence of thrombus. Normal compressibility and flow on color Doppler imaging. Profunda Femoral Vein: No evidence of thrombus. Normal compressibility and flow on color Doppler imaging. Femoral Vein: No evidence of thrombus. Normal compressibility, respiratory phasicity and response to augmentation. Popliteal Vein: No evidence of thrombus. Normal compressibility, respiratory phasicity and response to augmentation. Calf Veins: No evidence of thrombus. Normal compressibility and flow on color Doppler imaging. Superficial Great Saphenous Vein: No evidence of thrombus. Normal compressibility and flow  on color Doppler imaging. Venous Reflux:  None. Other Findings:  None. LEFT LOWER EXTREMITY Common Femoral Vein: No evidence of thrombus. Normal compressibility, respiratory phasicity and response to augmentation. Saphenofemoral Junction: No evidence of thrombus. Normal compressibility and flow on color Doppler imaging. Profunda Femoral Vein: No evidence of thrombus. Normal compressibility and flow on color Doppler imaging. Femoral Vein: No evidence of thrombus. Normal compressibility, respiratory phasicity and response to augmentation. Popliteal Vein: No evidence of thrombus. Normal compressibility, respiratory phasicity and response to augmentation. Calf Veins: No evidence of thrombus. Normal compressibility and flow on color Doppler  imaging. Superficial Great Saphenous Vein: No evidence of thrombus. Normal compressibility and flow on color Doppler imaging. Venous Reflux:  None. Other Findings:  None. IMPRESSION: No evidence of DVT within either lower extremity. Electronically Signed   By: Sandi Mariscal M.D.   On: 07/25/2016 13:31     PATHOLOGY:    ASSESSMENT AND PLAN:  Glioblastoma multiforme of brain ([place]) Right parietal lobe Glioblastoma Multiforme, grade 4, unknown methylation status of MGMT, S/P partial resection on 10/25/2015 by Dr. Owens Mckay with removal of 80% of tumor followed by concomitant Temodar + XRT.  He started his first cycle of adjuvant temozolomide on 02/21/2016.  Future cycles werre complicated by Keppra-induced thrombocytopenia (without documented seizure activity) and therefore, Keppra was discontinued on 04/27/2016 with significant improvement in platelet count and dose escalation of Temodar to 200 mg/m2.  However, performance status and blood counts declined requiring a change back to 150 mg/m2 of Temodar on 08/02/2016.  Labs today: CBC diff, CMET.  I personally reviewed and went over laboratory results with the patient.  The results are noted within this dictation.    Weight is down 2 lbs over the past 4 weeks.  Phillip Mckay, RD met with the patient today and provided him supplemental drinks.  The patient reports a good appetite and eats well.  Therefore, appetite stimulant therapy was not pursued today.  We discussed the patient hydration status.  His wife thinks he needs to drink more H2O.  The patient is encouraged to drink more.  He is provided tips and tricks to increase his H2O intake.  He shows a well demarcated, erythematous rash in the web of his #1 and #2 toes and spreading dorsally on the left foot.  I suspect interdigital tinea pedis.  Rx is provided for Lotrisone cream.  He does have B/L pedal edema.  Labs in 2 weeks: CBC diff, CMET.  He is on PROTONIX.  Prilosec was discontinued.  Return in 2  weeks for follow-up.    ORDERS PLACED FOR THIS ENCOUNTER: No orders of the defined types were placed in this encounter.   MEDICATIONS PRESCRIBED THIS ENCOUNTER: Meds ordered this encounter  Medications  . TRAVATAN Z 0.004 % SOLN ophthalmic solution  . clotrimazole-betamethasone (LOTRISONE) cream    Sig: Apply 1 application topically 2 (two) times daily.    Dispense:  30 g    Refill:  0    Order Specific Question:   Supervising Provider    Answer:   Patrici Ranks [id number]    THERAPY PLAN:  Continue Temodar.  All questions were answered. The patient knows to call the clinic with any problems, questions or concerns. We can certainly see the patient much sooner if necessary.  Patient and plan discussed with Dr. Ancil Linsey and she is in agreement with the aforementioned.   This note is electronically signed by: Doy Mince 08/10/2016 5:53 PM

## 2016-08-10 NOTE — Progress Notes (Signed)
Follow up with High Risk Cancer pt who has very poor fluid intake  Contacted Pt by Visiting at office visit.    Wt Readings from Last 10 Encounters:  08/10/16 167 lb 14.4 oz (76.2 kg)  07/19/16 170 lb 3.2 oz (77.2 kg)  07/02/16 169 lb 9.6 oz (76.9 kg)  06/21/16 170 lb 1.6 oz (77.2 kg)  06/14/16 173 lb 1.6 oz (78.5 kg)  05/17/16 183 lb 1.6 oz (83.1 kg)  05/04/16 179 lb 1.6 oz (81.2 kg)  04/27/16 181 lb 1.6 oz (82.1 kg)  04/20/16 179 lb 11.2 oz (81.5 kg)  04/02/16 179 lb 1.6 oz (81.2 kg)   Patient weight has decreased by 2.5 lbs in the last 3 weeks. His weight loss does seem to have slowed.   Patient's wife reports oral intake as poor. Pt is still suffering from symptoms/side effects including nausea and taste changes. No c/d.   From Wife report, when pt is at home, he is very stubborn and family is largely unable to coax him to drink or eat.   Despite this, he is eating better than he was 3 weeks ago.Wife says the IVF with steroids really helped him. A few weeks ago he was only eating spoonfuls of food. He now is eating 3x a time with 1 "good meal". His fluid intake is more concerning. Wife reports she can only get him to drink 8 oz of fluid in the morning and about the same amount later in the day. Pt reports that this is due to his variable taste changes and the fear of becoming nauseated.   Pt says his taste changes are not consistent and what tastes good one day will not taste good the next. Wife reports trying multiple clear nutritional supplements to differing affect. He likes these much more than the thicker variety of supplements.  Pt is still drinking diet soda.   Re-Emphasized that pt should be drinking regular sodas for the extra calories. He will not drink water or better hydrating beverages such as low carb sports drinks.  He has tried seasoning foods with his hot sauce, but he did not like this.   Brainstormed with Wife and PA. Recommended mixing Ensure with ice cream. Pt  stated he thought he felt like trying a milkshake today. Encouraged to continue trying different spices/seasonings and beverages. He is provided with a case of Ensure Clear today.   Burtis Junes RD, LDN, [facility] Pager: [phone] 08/10/2016 11:40 AM

## 2016-08-21 ENCOUNTER — Other Ambulatory Visit (HOSPITAL_COMMUNITY): Payer: Self-pay | Admitting: *Deleted

## 2016-08-21 DIAGNOSIS — C719 Malignant neoplasm of brain, unspecified: Secondary | ICD-10-CM

## 2016-08-23 ENCOUNTER — Other Ambulatory Visit (HOSPITAL_COMMUNITY): Payer: Self-pay | Admitting: Hematology & Oncology

## 2016-08-23 DIAGNOSIS — C719 Malignant neoplasm of brain, unspecified: Secondary | ICD-10-CM

## 2016-08-27 ENCOUNTER — Encounter (HOSPITAL_COMMUNITY): Payer: Medicare Other | Attending: Hematology & Oncology | Admitting: Hematology & Oncology

## 2016-08-27 ENCOUNTER — Encounter (HOSPITAL_COMMUNITY): Payer: Self-pay | Admitting: Hematology & Oncology

## 2016-08-27 ENCOUNTER — Encounter (HOSPITAL_COMMUNITY): Payer: Medicare Other

## 2016-08-27 VITALS — BP 111/67 | HR 94 | Temp 97.9°F | Resp 16 | Wt 161.7 lb

## 2016-08-27 DIAGNOSIS — I10 Essential (primary) hypertension: Secondary | ICD-10-CM | POA: Diagnosis not present

## 2016-08-27 DIAGNOSIS — R634 Abnormal weight loss: Secondary | ICD-10-CM

## 2016-08-27 DIAGNOSIS — Z79899 Other long term (current) drug therapy: Secondary | ICD-10-CM | POA: Diagnosis not present

## 2016-08-27 DIAGNOSIS — C719 Malignant neoplasm of brain, unspecified: Secondary | ICD-10-CM | POA: Diagnosis not present

## 2016-08-27 DIAGNOSIS — Z7984 Long term (current) use of oral hypoglycemic drugs: Secondary | ICD-10-CM | POA: Diagnosis not present

## 2016-08-27 DIAGNOSIS — R110 Nausea: Secondary | ICD-10-CM | POA: Diagnosis not present

## 2016-08-27 DIAGNOSIS — R630 Anorexia: Secondary | ICD-10-CM | POA: Diagnosis not present

## 2016-08-27 DIAGNOSIS — E099 Drug or chemical induced diabetes mellitus without complications: Secondary | ICD-10-CM | POA: Insufficient documentation

## 2016-08-27 DIAGNOSIS — E785 Hyperlipidemia, unspecified: Secondary | ICD-10-CM | POA: Insufficient documentation

## 2016-08-27 DIAGNOSIS — E2740 Unspecified adrenocortical insufficiency: Secondary | ICD-10-CM | POA: Insufficient documentation

## 2016-08-27 DIAGNOSIS — Z87891 Personal history of nicotine dependence: Secondary | ICD-10-CM | POA: Diagnosis not present

## 2016-08-27 DIAGNOSIS — T380X5A Adverse effect of glucocorticoids and synthetic analogues, initial encounter: Secondary | ICD-10-CM | POA: Diagnosis not present

## 2016-08-27 LAB — COMPREHENSIVE METABOLIC PANEL
ALT: 14 U/L — ABNORMAL LOW (ref 17–63)
ANION GAP: 7 (ref 5–15)
AST: 25 U/L (ref 15–41)
Albumin: 4 g/dL (ref 3.5–5.0)
Alkaline Phosphatase: 33 U/L — ABNORMAL LOW (ref 38–126)
BUN: 9 mg/dL (ref 6–20)
CHLORIDE: 108 mmol/L (ref 101–111)
CO2: 26 mmol/L (ref 22–32)
Calcium: 9.6 mg/dL (ref 8.9–10.3)
Creatinine, Ser: 0.92 mg/dL (ref 0.61–1.24)
Glucose, Bld: 103 mg/dL — ABNORMAL HIGH (ref 65–99)
POTASSIUM: 4.2 mmol/L (ref 3.5–5.1)
Sodium: 141 mmol/L (ref 135–145)
TOTAL PROTEIN: 6.4 g/dL — AB (ref 6.5–8.1)
Total Bilirubin: 1.1 mg/dL (ref 0.3–1.2)

## 2016-08-27 LAB — CBC WITH DIFFERENTIAL/PLATELET
BASOS ABS: 0 10*3/uL (ref 0.0–0.1)
Basophils Relative: 1 %
EOS PCT: 2 %
Eosinophils Absolute: 0.1 10*3/uL (ref 0.0–0.7)
HCT: 35.4 % — ABNORMAL LOW (ref 39.0–52.0)
Hemoglobin: 12.2 g/dL — ABNORMAL LOW (ref 13.0–17.0)
LYMPHS PCT: 19 %
Lymphs Abs: 0.8 10*3/uL (ref 0.7–4.0)
MCH: 33.6 pg (ref 26.0–34.0)
MCHC: 34.5 g/dL (ref 30.0–36.0)
MCV: 97.5 fL (ref 78.0–100.0)
MONO ABS: 0.5 10*3/uL (ref 0.1–1.0)
MONOS PCT: 12 %
Neutro Abs: 2.7 10*3/uL (ref 1.7–7.7)
Neutrophils Relative %: 66 %
PLATELETS: 133 10*3/uL — AB (ref 150–400)
RBC: 3.63 MIL/uL — ABNORMAL LOW (ref 4.22–5.81)
RDW: 14.8 % (ref 11.5–15.5)
WBC: 4.1 10*3/uL (ref 4.0–10.5)

## 2016-08-27 MED ORDER — PANTOPRAZOLE SODIUM 40 MG PO TBEC: 40.0000 mg | DELAYED_RELEASE_TABLET | Freq: Every day | ORAL | 1 refills | Status: DC

## 2016-08-27 NOTE — Progress Notes (Signed)
HEMATOLOGY/ONCOLOGY PROGRESS NOTE  Date of Service: 08/27/2016  Patient Care Team: [name], MD as PCP - General (Internal Medicine)  CHIEF COMPLAINTS/PURPOSE OF CONSULTATION:   Continued treatment of GBM.  HISTORY OF PRESENTING ILLNESS:  Phillip Mckay is a wonderful 75 y.o. male who presents for ongoing management of GBM Patient  had a subtotal resection of about 80% of the tumor mass by Dr. Owens Mckay on 10/25/2015 at [facility]. He completed concurrent radiation therapy plus temozolomide (75 mg meter squared = 140 mg daily while on radiation) chemotherapy. Dr. Lisbeth Mckay is his radiation oncologist.  He was started on his first cycle of adjuvant temozolomide at 150 mg/m (280 mg daily from D1 to D5) On 02/21/2016.  He was due for his second cycle on 03/19/2016.  His platelet counts were down to 80k and his temozolomide was held. Dose escalation to 200 mg/m was not done and he was asked to wait to restrict his improvement prior to proceeding with his second cycle of temozolomide.  Keppra was discontinued and platelets recovered. Temodar was kept at the 150 mg/m2 dosing, which he started on August 7th and took for 5 days.   Phillip Mckay is accompanied by his wife and presents in wheelchair. He is wearing alternating electrical field therapy. He jokes throughout our visit. Temodar was increased up to the recommended maintenance dose but Phillip Mckay has had difficulty in general tolerating the medication with decreased appetite and nausea.   He was doing better with the decreased dose of Temodar, his wife states he wasn't as sick. He has not been eating well though. He has lost about 9 lb since 07/19/16. He experiences stomach discomfort with food intake.  He has received a flu shot and a whooping cough shot this year.    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 In his wife have 2 sons and they adopted one daughter Phillip Mckay is with him today.  #4 history of hypertension #5 history of dyslipidemia  SURGICAL HISTORY:   10/25/2015 Surgery  Subtotal resection Dr. Owens Mckay   SOCIAL HISTORY: Social History   Social History  . Marital status: Married    Spouse name: N/A  . Number of children: N/A  . Years of education: N/A   Occupational History  . Not on file.   Social History Main Topics  . Smoking status: Former Research scientist (life sciences)  . Smokeless tobacco: Never Used  . Alcohol use No  . Drug use: No  . Sexual activity: No   Other Topics Concern  . Not on file   Social History Narrative  . No narrative on file  former smoker quit at age 3 yo Worked as a Pharmacist, community for about 40 years.  FAMILY HISTORY: Family History  Problem Relation Age of Onset  . Cancer Mother   . Diabetes Mother   . Cancer Father   . Cancer Brother     ALLERGIES:  has No Known Allergies.  MEDICATIONS:  Current Outpatient Prescriptions  Medication Sig Dispense Refill  . B Complex-C ([facility] B COMPLEX PLUS C) TABS Take by mouth daily.  2  . B-D UF III MINI PEN NEEDLES 31G X 5 MM MISC     . B-D ULTRA-FINE 33 LANCETS MISC by Does not apply route.    . cholecalciferol 2000  units tablet Take 1 tablet (2,000 Units total) by mouth daily. 30 tablet 3  . clotrimazole-betamethasone (LOTRISONE) cream Apply 1 application topically 2 (two) times daily. 30 g 0  . Docusate Sodium 100 MG capsule Take by mouth.    . gabapentin (NEURONTIN) 300 MG capsule Reported on 04/20/2016    . glucose blood (FREESTYLE INSULINX TEST) test strip     . Glycerin-Hypromellose-PEG 400 0.2-0.2-1 % SOLN Apply to eye. Reported on 04/20/2016    . latanoprost (XALATAN) 0.005 % ophthalmic solution Apply to eye. Reported on 04/20/2016    . lisinopril (PRINIVIL,ZESTRIL) 40 MG tablet     . LORazepam (ATIVAN) 1 MG tablet Take as needed for  seizure. Take 1 mg at onset may repeat in 5 minute intervals X 3 30 tablet 3  . metFORMIN (GLUCOPHAGE) 1000 MG tablet Take by mouth.    Marland Kitchen omeprazole (PRILOSEC) 20 MG capsule     . ondansetron (ZOFRAN) 8 MG tablet Take 1 tablet (8 mg total) by mouth every 8 (eight) hours as needed for nausea or vomiting. 30 tablet 2  . pantoprazole (PROTONIX) 40 MG tablet Take 1 tablet (40 mg total) by mouth daily. 30 tablet 1  . potassium chloride SA (K-DUR,KLOR-CON) 20 MEQ tablet Take 2 tablets (40 mEq total) by mouth daily. 60 tablet 1  . Probiotic Product [facility]) CAPS Take by mouth.    . prochlorperazine (COMPAZINE) 10 MG tablet Take 1 tablet (10 mg total) by mouth every 6 (six) hours as needed for nausea or vomiting. Reported on 04/20/2016 30 tablet 2  . simvastatin (ZOCOR) 20 MG tablet Reported on 04/20/2016    . tamsulosin (FLOMAX) 0.4 MG CAPS capsule Take by mouth.    . temozolomide (TEMODAR) 140 MG capsule Take 2 capsules (280 mg total) by mouth daily. May take on an empty stomach or at bedtime to decrease nausea & vomiting. TAKE for 5 days every 28 days 10 capsule 1  . tetrahydrozoline 0.05 % ophthalmic solution Reported on 04/20/2016    . traMADol (ULTRAM) 50 MG tablet Take 1 tablet (50 mg total) by mouth every 6 (six) hours as needed. 60 tablet 2  . TRAVATAN Z 0.004 % SOLN ophthalmic solution     . travoprost, benzalkonium, (TRAVATAN) 0.004 % ophthalmic solution 1 drop at bedtime.     No current facility-administered medications for this visit.     REVIEW OF SYSTEMS:   Review of Systems  Constitutional: Positive for weight loss (about 9 lb since 07/19/16).       Poor appetite  HENT: Negative.   Eyes: Negative.   Respiratory: Negative.   Cardiovascular: Negative.   Gastrointestinal: Negative.        Gastritis  Genitourinary: Negative.   Musculoskeletal: Negative.   Skin: Negative.   Neurological: Negative.   Endo/Heme/Allergies: Negative.   Psychiatric/Behavioral: Negative.   All other  systems reviewed and are negative. 14 point review of systems was performed and is negative except as detailed under history of present illness and above   PHYSICAL EXAMINATION: ECOG PERFORMANCE STATUS: 2 - Symptomatic, <50% confined to bed   Vitals:   08/27/16 1359  BP: 111/67  Pulse: 94  Resp: 16  Temp: 97.9 F (36.6 C)   Filed Weights   08/27/16 1359  Weight: 161 lb 11.2 oz (73.3 kg)   .Body mass index is 25.33 kg/m.  GENERAL:alert, in no acute distress and comfortable, has wires from the alternating electrical currents machine on his head and in wheelchair.  SKIN:  skin color, texture, turgor are normal, no rashes or significant lesions EYES: normal, conjunctiva are pink and non-injected, sclera clear OROPHARYNX:no exudate, no erythema and lips, buccal mucosa, and tongue normal  NECK: supple, no JVD, thyroid normal size, non-tender, without nodularity LYMPH:  no palpable lymphadenopathy in the cervical, axillary or inguinal LUNGS: clear to auscultation with normal respiratory effort HEART: regular rate & rhythm,  no murmurs and no lower extremity edema ABDOMEN: abdomen soft, non-tender, normoactive bowel sounds  Musculoskeletal: no cyanosis of digits and no clubbing  PSYCH: alert & oriented x 3 with fluent speech NEURO: Moving all 4 extremities, some clumsiness in his left upper extremity  LABORATORY DATA:  I have reviewed the data as listed  . CBC Latest Ref Rng & Units 08/27/2016 08/10/2016 08/01/2016  WBC 4.0 - 10.5 K/uL 4.1 3.8(L) 3.6(L)  Hemoglobin 13.0 - 17.0 g/dL 12.2(L) 11.1(L) 11.2(L)  Hematocrit 39.0 - 52.0 % 35.4(L) 32.5(L) 32.3(L)  Platelets 150 - 400 K/uL 133(L) 117(L) 151   . CMP Latest Ref Rng & Units 08/27/2016 08/10/2016 08/01/2016  Glucose 65 - 99 mg/dL 103(H) 91 106(H)  BUN 6 - 20 mg/dL 9 7 9   Creatinine 0.61 - 1.24 mg/dL 0.92 1.00 0.83  Sodium 135 - 145 mmol/L 141 140 140  Potassium 3.5 - 5.1 mmol/L 4.2 4.1 3.0(L)  Chloride 101 - 111 mmol/L  108 107 105  CO2 22 - 32 mmol/L 26 28 29   Calcium 8.9 - 10.3 mg/dL 9.6 9.4 9.1  Total Protein 6.5 - 8.1 g/dL 6.4(L) 6.0(L) 5.9(L)  Total Bilirubin 0.3 - 1.2 mg/dL 1.1 1.0 1.0  Alkaline Phos 38 - 126 U/L 33(L) 33(L) 33(L)  AST 15 - 41 U/L 25 26 25   ALT 17 - 63 U/L 14(L) 17 18    Final Diagnosis  Brain, right parietal, biopsies (specimens 1-2): - Glioblastoma, WHO grade 4; see comment.  Electronically signed by Golden Pop, MD on 10/26/2015 at [facility]: I have personally reviewed the radiological images as listed and agreed with the findings in the report. No results found. Study Result   CLINICAL DATA:  Bilateral lower extremity pain and edema. Former smoker. History of brain cancer. Evaluate for DVT.  EXAM: BILATERAL LOWER EXTREMITY VENOUS DOPPLER ULTRASOUND  TECHNIQUE: Gray-scale sonography with graded compression, as well as color Doppler and duplex ultrasound were performed to evaluate the lower extremity deep venous systems from the level of the common femoral vein and including the common femoral, femoral, profunda femoral, popliteal and calf veins including the posterior tibial, peroneal and gastrocnemius veins when visible. The superficial great saphenous vein was also interrogated. Spectral Doppler was utilized to evaluate flow at rest and with distal augmentation maneuvers in the common femoral, femoral and popliteal veins.  COMPARISON:  None.  FINDINGS: RIGHT LOWER EXTREMITY  Common Femoral Vein: No evidence of thrombus. Normal compressibility, respiratory phasicity and response to augmentation.  Saphenofemoral Junction: No evidence of thrombus. Normal compressibility and flow on color Doppler imaging.  Profunda Femoral Vein: No evidence of thrombus. Normal compressibility and flow on color Doppler imaging.  Femoral Vein: No evidence of thrombus. Normal compressibility, respiratory phasicity and response to  augmentation.  Popliteal Vein: No evidence of thrombus. Normal compressibility, respiratory phasicity and response to augmentation.  Calf Veins: No evidence of thrombus. Normal compressibility and flow on color Doppler imaging.  Superficial Great Saphenous Vein: No evidence of thrombus. Normal compressibility and flow on color Doppler imaging.  Venous Reflux:  None.  Other  Findings:  None.  LEFT LOWER EXTREMITY  Common Femoral Vein: No evidence of thrombus. Normal compressibility, respiratory phasicity and response to augmentation.  Saphenofemoral Junction: No evidence of thrombus. Normal compressibility and flow on color Doppler imaging.  Profunda Femoral Vein: No evidence of thrombus. Normal compressibility and flow on color Doppler imaging.  Femoral Vein: No evidence of thrombus. Normal compressibility, respiratory phasicity and response to augmentation.  Popliteal Vein: No evidence of thrombus. Normal compressibility, respiratory phasicity and response to augmentation.  Calf Veins: No evidence of thrombus. Normal compressibility and flow on color Doppler imaging.  Superficial Great Saphenous Vein: No evidence of thrombus. Normal compressibility and flow on color Doppler imaging.  Venous Reflux:  None.  Other Findings:  None.  IMPRESSION: No evidence of DVT within either lower extremity.   Electronically Signed   By: Sandi Mariscal M.D.   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 Completed concurrent temozolomide and radiation therapy. Received his first cycle of adjuvant temozolomide on 02/21/2016. Patient was due for his second cycle of temozolomide on 03/19/2016 but was postponed due to thrombocytopenia with  platelets of 80k Given his thrombocytopenia his dose for second cycle of temozolomide was maintained at 150mg /m2 and started on August 7th.  Was eventually increased to full maintenance dosing Temodar but has had difficulty with appetite/nausea. Last cycle was dose reduced back to 150 mg/m2. Weight has declined again. His wife notes that he has no appetite.   Advised for the patient to hold his next cycle of Temodar due to recent weight loss. He has lost about 9 lbs since 07/19/16.   He is overdue for an MRI Brain. I have placed an order for this in the next week.   We will re-group after his Brain MRI and discuss whether or not to restart Temodar or permanently discontinue given ongoing struggles with appetite and weight.  Orders Placed This Encounter  Procedures  . MR Brain W Wo Contrast    Standing Status:   Future    Standing Expiration Date:   08/27/2017    Order Specific Question:   If indicated for the ordered procedure, I authorize the administration of contrast media per Radiology protocol    Answer:   Yes    Order Specific Question:   Reason for Exam (SYMPTOM  OR DIAGNOSIS REQUIRED)    Answer:   glioblastoma    Order Specific Question:   Preferred imaging location?    Answer:   [facility] (table limit-350lbs)    Order Specific Question:   What is the patient's sedation requirement?    Answer:   No Sedation    Order Specific Question:   Does the patient have a pacemaker or implanted devices?    Answer:   No    All of the patients questions were answered with apparent satisfaction. The patient knows to call the clinic with any problems, questions or concerns.  This document serves as a record of services personally performed by Ancil Linsey, MD. It was created on her behalf by Arlyce Harman, a trained medical scribe. The creation of this record is based on the scribe's personal observations and the provider's statements to them. This document has been checked and  approved by the attending provider.  I  have reviewed the above documentation for accuracy and completeness, and I agree with the above.  Molli Hazard, MD  08/27/2016 2:53 PM

## 2016-08-27 NOTE — Patient Instructions (Signed)
[place] at [facility] Discharge Instructions  RECOMMENDATIONS MADE BY THE CONSULTANT AND ANY TEST RESULTS WILL BE SENT TO YOUR REFERRING PHYSICIAN.  You saw Dr.Penland today. MRI will be scheduled soon. Follow up after MRI. See Amy at checkout for appointments.  Thank you for choosing [place] at [facility] to provide your oncology and hematology care.  To afford each patient quality time with our provider, please arrive at least 15 minutes before your scheduled appointment time.   Beginning January 23rd 2017 lab work for the [facility] will be done in the  Main lab at [facility] on 1st floor. If you have a lab appointment with the [facility] please come in thru the  Main Entrance and check in at the main information desk  You need to re-schedule your appointment should you arrive 10 or more minutes late.  We strive to give you quality time with our providers, and arriving late affects you and other patients whose appointments are after yours.  Also, if you no show three or more times for appointments you may be dismissed from the clinic at the providers discretion.     Again, thank you for choosing [facility].  Our hope is that these requests will decrease the amount of time that you wait before being seen by our physicians.       _____________________________________________________________  Should you have questions after your visit to [facility], please contact our office at [phone] between the hours of 8:30 a.m. and 4:30 p.m.  Voicemails left after 4:30 p.m. will not be returned until the following business day.  For prescription refill requests, have your pharmacy contact our office.         Resources For Cancer Patients and their Caregivers ? American Cancer Society: Can assist with transportation, wigs, general needs, runs Look Good Feel Better.        612-771-2319 ? Cancer  Care: Provides financial assistance, online support groups, medication/co-pay assistance.  1-800-813-HOPE 312 261 4877) ? [place] Assists [place] cancer patients and their families through emotional , educational and financial support.  754-756-7075 ? [place] DSS Where to apply for food stamps, Medicaid and utility assistance. 812-579-3429 ? [employer]: Transportation to medical appointments. 415 206 8667 ? Social Security Administration: May apply for disability if have a Stage IV cancer. 978-420-7829 (781)324-2573 ? [employer], Disability and Transit Services: Assists with nutrition, care and transit needs. [place] Support Programs: @10RELATIVEDAYS @ > Cancer Support Group  2nd Tuesday of the month 1pm-2pm, Journey Room  > Creative Journey  3rd Tuesday of the month 1130am-1pm, Journey Room  > Look Good Feel Better  1st Wednesday of the month 10am-12 noon, Journey Room (Call [place] to register [phone])

## 2016-08-28 ENCOUNTER — Encounter (HOSPITAL_COMMUNITY): Payer: Self-pay | Admitting: Hematology & Oncology

## 2016-08-29 ENCOUNTER — Ambulatory Visit (HOSPITAL_COMMUNITY)
Admission: RE | Admit: 2016-08-29 | Discharge: 2016-08-29 | Disposition: A | Discharge status: Home / Self Care | Payer: Medicare Other | Source: Ambulatory Visit | Attending: Hematology & Oncology | Admitting: Hematology & Oncology

## 2016-08-29 DIAGNOSIS — R9082 White matter disease, unspecified: Secondary | ICD-10-CM | POA: Diagnosis not present

## 2016-08-29 DIAGNOSIS — C719 Malignant neoplasm of brain, unspecified: Secondary | ICD-10-CM | POA: Diagnosis present

## 2016-08-29 DIAGNOSIS — C713 Malignant neoplasm of parietal lobe: Secondary | ICD-10-CM | POA: Diagnosis not present

## 2016-08-29 MED ORDER — GADOBENATE DIMEGLUMINE 529 MG/ML IV SOLN
15.0000 mL | Freq: Once | INTRAVENOUS | Status: AC | PRN
  Administered 2016-08-29: 15 mL via INTRAVENOUS

## 2016-08-30 ENCOUNTER — Ambulatory Visit (HOSPITAL_COMMUNITY): Payer: Medicare Other

## 2016-08-31 ENCOUNTER — Encounter (HOSPITAL_COMMUNITY): Payer: Self-pay | Admitting: Oncology

## 2016-08-31 ENCOUNTER — Encounter (HOSPITAL_BASED_OUTPATIENT_CLINIC_OR_DEPARTMENT_OTHER): Payer: Medicare Other | Admitting: Oncology

## 2016-08-31 DIAGNOSIS — C719 Malignant neoplasm of brain, unspecified: Secondary | ICD-10-CM

## 2016-08-31 DIAGNOSIS — R634 Abnormal weight loss: Secondary | ICD-10-CM

## 2016-08-31 NOTE — Patient Instructions (Addendum)
[place] at [facility] Discharge Instructions  RECOMMENDATIONS MADE BY THE CONSULTANT AND ANY TEST RESULTS WILL BE SENT TO YOUR REFERRING PHYSICIAN.  You were seen today by Kirby Crigler PA-C. Hold Temodar. Follow up in 2 weeks.    Thank you for choosing [place] at [facility] to provide your oncology and hematology care.  To afford each patient quality time with our provider, please arrive at least 15 minutes before your scheduled appointment time.   Beginning January 23rd 2017 lab work for the [facility] will be done in the  Main lab at [facility] on 1st floor. If you have a lab appointment with the [facility] please come in thru the  Main Entrance and check in at the main information desk  You need to re-schedule your appointment should you arrive 10 or more minutes late.  We strive to give you quality time with our providers, and arriving late affects you and other patients whose appointments are after yours.  Also, if you no show three or more times for appointments you may be dismissed from the clinic at the providers discretion.     Again, thank you for choosing [facility].  Our hope is that these requests will decrease the amount of time that you wait before being seen by our physicians.       _____________________________________________________________  Should you have questions after your visit to [facility], please contact our office at [phone] between the hours of 8:30 a.m. and 4:30 p.m.  Voicemails left after 4:30 p.m. will not be returned until the following business day.  For prescription refill requests, have your pharmacy contact our office.         Resources For Cancer Patients and their Caregivers ? American Cancer Society: Can assist with transportation, wigs, general needs, runs Look Good Feel Better.        224 293 7591 ? Cancer Care: Provides financial assistance,  online support groups, medication/co-pay assistance.  1-800-813-HOPE 214-423-0155) ? [place] Assists [place] cancer patients and their families through emotional , educational and financial support.  212-236-0425 ? [place] DSS Where to apply for food stamps, Medicaid and utility assistance. 830-461-0813 ? RC[employer]: Transportation to medical appointments. 253-290-4023 ? Social Security Administration: May apply for disability if have a Stage IV cancer. 9410772828 479 002 5702 ? [employer], Disability and Transit Services: Assists with nutrition, care and transit needs. [place] Support Programs: @10RELATIVEDAYS @ > Cancer Support Group  2nd Tuesday of the month 1pm-2pm, Journey Room  > Creative Journey  3rd Tuesday of the month 1130am-1pm, Journey Room  > Look Good Feel Better  1st Wednesday of the month 10am-12 noon, Journey Room (Call [place] to register [phone])

## 2016-08-31 NOTE — Assessment & Plan Note (Addendum)
Right parietal lobe Glioblastoma Multiforme, grade 4, unknown methylation status of MGMT, S/P partial resection on 10/25/2015 by Dr. Owens Shark with removal of 80% of tumor followed by concomitant Temodar + XRT.  He started his first cycle of adjuvant temozolomide on 02/21/2016.  Future cycles werre complicated by Keppra-induced thrombocytopenia (without documented seizure activity) and therefore, Keppra was discontinued on 04/27/2016 with significant improvement in platelet count and dose escalation of Temodar to 200 mg/m2.  However, performance status and blood counts declined requiring a change back to 150 mg/m2 of Temodar on 08/02/2016.  Temodar is now on HOLD due to ongoing weight loss.  Labs on 08/27/2016: CBC diff, CMET.  I personally reviewed and went over laboratory results with the patient.  The results are noted within this dictation.    No role for labs today.  I personally reviewed and went over radiographic studies with the patient.  The results are noted within this dictation.  MRI brain report is reviewed with the patient.  His case has been discussed with Dr. Lisbeth Renshaw.    Plan is to have his case presented at brain tumor board for further recommendations.  For now, HOLD Temodar.  Return in ~ 2 weeks for follow-up and discuss medical oncology recommendations.

## 2016-08-31 NOTE — Progress Notes (Signed)
Phillip Burly, MD [place] [id number]  Glioblastoma multiforme of brain [facility])  CURRENT THERAPY: Temodar 150 mg/m2 on HOLD  INTERVAL HISTORY: Phillip Mckay 75 y.o. 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 Owens Shark at [facility].  S/P Temodar with concurrent XRT and began adjuvant Temodar on [id number], complicated by Keppra-induced thrombocytopenia (resolved with discontinuation of Keppra).    Glioblastoma multiforme of brain ([place])   07/19/2016 Treatment Plan Change    Dose of Temodar reduced to 150 mg/m2      07/19/2016 Adverse Reaction    Weight loss, poor tolerance, thrombocytopenia secondary to Temodar 200 mg/m2      07/25/2016 Treatment Plan Change    Treatment held x 1 week      07/25/2016 Imaging    [place] LE B/L- No evidence of DVT within either lower extremity.      08/01/2016 Echocardiogram    Left ventricle: The cavity size was normal. Wall thickness was normal. Systolic function was normal. The estimated ejection fraction was in the range of 50% to 55%. Wall motion was normal; there were no regional wall motion abnormalities. Doppler parameters are consistent with abnormal left ventricular relaxation (grade 1 diastolic dysfunction). Doppler parameters are consistent with high ventricular filling pressure. - Left atrium: The atrium was mildly dilated. - Tricuspid valve: There was mild regurgitation.      08/04/2016 -  Chemotherapy    Temodar 150 mg/m2 (beginning on 10/21)       Since holding Temodar, he reports that his nausea is overall improved.  He denies any new complaints.  He wife reports increased memory loss.  Weight is stable compared to the other day, but ongoing weight loss overall is appreciated.  Review of Systems  Constitutional: Positive for weight loss. Negative for chills  and fever.  HENT: Negative for tinnitus.   Eyes: Negative.  Negative for blurred vision and double vision.  Respiratory: Negative.  Negative for cough.   Cardiovascular: Negative.  Negative for chest pain.  Gastrointestinal: Negative.  Negative for constipation, diarrhea, nausea and vomiting.  Genitourinary: Negative.   Musculoskeletal: Negative.   Neurological: Negative.  Negative for headaches.  Endo/Heme/Allergies: Negative.   Psychiatric/Behavioral: Positive for memory loss.    Past Medical History:  Diagnosis Date  . Brain cancer ([place])   . Diabetes mellitus without complication ([place])   . Glioblastoma multiforme of brain ([place]) 10/27/2015    Past Surgical History:  Procedure Laterality Date  . CHOLECYSTECTOMY      Family History  Problem Relation Age of Onset  . Cancer Mother   . Diabetes Mother   . Cancer Father   . Cancer Brother     Social History   Social History  . Marital status: Married    Spouse name: N/A  . Number of children: N/A  . Years of education: N/A   Social History Main Topics  . Smoking status: Former Research scientist (life sciences)  . Smokeless tobacco: Never Used  . Alcohol use No  . Drug use: No  . Sexual activity: No   Other Topics Concern  . None   Social History Narrative  . None     PHYSICAL EXAMINATION  ECOG PERFORMANCE STATUS: 2 - Symptomatic, <50% confined to bed  Vitals:   08/31/16 1347  BP: 116/73  Pulse: 91  Resp: 16  GENERAL:alert, no distress, comfortable, cooperative, smiling and accompanied by his wife, in wheelchair. SKIN: skin color, texture, turgor are normal, no rashes or significant lesions HEAD: Normocephalic, No masses, lesions, tenderness or abnormalities EYES: normal, EOMI, Conjunctiva are pink and non-injected EARS: External ears normal OROPHARYNX:lips, buccal mucosa, and tongue normal and mucous membranes are moist  NECK: supple, trachea midline LYMPH:  no palpable lymphadenopathy BREAST:not examined LUNGS: clear to  auscultation  HEART: regular rate & rhythm ABDOMEN:abdomen soft and normal bowel sounds BACK: Back symmetric, no curvature. EXTREMITIES:less then 2 second capillary refill, no joint deformities, effusion, or inflammation, no skin discoloration, no cyanosis  NEURO: alert & oriented x 3 with fluent speech, no focal motor/sensory deficits, gait normal   LABORATORY DATA: CBC    Component Value Date/Time   WBC 4.1 08/27/2016 1320   RBC 3.63 (L) 08/27/2016 1320   HGB 12.2 (L) 08/27/2016 1320   HCT 35.4 (L) 08/27/2016 1320   PLT 133 (L) 08/27/2016 1320   MCV 97.5 08/27/2016 1320   MCH 33.6 08/27/2016 1320   MCHC 34.5 08/27/2016 1320   RDW 14.8 08/27/2016 1320   LYMPHSABS 0.8 08/27/2016 1320   MONOABS 0.5 08/27/2016 1320   EOSABS 0.1 08/27/2016 1320   BASOSABS 0.0 08/27/2016 1320      Chemistry      Component Value Date/Time   NA 141 08/27/2016 1320   K 4.2 08/27/2016 1320   CL 108 08/27/2016 1320   CO2 26 08/27/2016 1320   BUN 9 08/27/2016 1320   CREATININE 0.92 08/27/2016 1320      Component Value Date/Time   CALCIUM 9.6 08/27/2016 1320   ALKPHOS 33 (L) 08/27/2016 1320   AST 25 08/27/2016 1320   ALT 14 (L) 08/27/2016 1320   BILITOT 1.1 08/27/2016 1320        PENDING LABS:   RADIOGRAPHIC STUDIES:  Mr [name] [id number] Contrast  Result Date: 08/29/2016 CLINICAL DATA:  Right parietal glioblastoma status post subtotal resection January 2017. Subsequent radiation and Temodar. No reported change in steroids. EXAM: MRI HEAD WITHOUT AND WITH CONTRAST TECHNIQUE: Multiplanar, multiecho pulse sequences of the brain and surrounding structures were obtained without and with intravenous contrast. CONTRAST:  10mL MULTIHANCE GADOBENATE DIMEGLUMINE 529 MG/ML IV SOLN COMPARISON:  04/16/2016 FINDINGS: Brain: Right parietal glioblastoma resection site shows concerning new marginal enhancement that has thick lobulated appearance. The cavity with internal chronic blood products and peripheral  enhancement measures up to 39 by 31 mm on axial postcontrast imaging, previously 31 x 23 mm. Surrounding T2 signal abnormality is also increased, with local mass effect causing sulcal narrowing. No focal restricted diffusion typical of cellular tumor. Limited around the margin of the resection cavity where there is artifact from blood products. There is a 2 mm focus of enhancement in the right frontal white matter, parasagittal, that is contiguous with the white matter hyperintensity. No acute infarct, hemorrhage, hydrocephalus, or herniation. Remote left suboccipital craniotomy with peripheral cerebellar scarring, reportedly for acoustic neuroma resection remotely. Vascular: Preserved flow voids Skull and upper cervical spine: Negative Sinuses/Orbits: Negative IMPRESSION: 1. New marginal enhancement and increased signal abnormality around the right parietal glioblastoma resection cavity compatible with progression. Cannot exclude radiation changes. 2. There is new 2 mm focus of white matter enhancement in the parasagittal right frontal lobe. This is primarily concerning for distant site of disease progression, need correlation with radiation dose distribution. Electronically Signed   By: Monte Fantasia M.D.   On: 08/29/2016 11:31     PATHOLOGY:  ASSESSMENT AND PLAN:  Glioblastoma multiforme of brain (HCC) Right parietal lobe Glioblastoma Multiforme, grade 4, unknown methylation status of MGMT, S/P partial resection on 10/25/2015 by Dr. Owens Shark with removal of 80% of tumor followed by concomitant Temodar + XRT.  He started his first cycle of adjuvant temozolomide on 02/21/2016.  Future cycles werre complicated by Keppra-induced thrombocytopenia (without documented seizure activity) and therefore, Keppra was discontinued on 04/27/2016 with significant improvement in platelet count and dose escalation of Temodar to 200 mg/m2.  However, performance status and blood counts declined requiring a change back to 150  mg/m2 of Temodar on 08/02/2016.  Temodar is now on HOLD due to ongoing weight loss.  Labs on 08/27/2016: CBC diff, CMET.  I personally reviewed and went over laboratory results with the patient.  The results are noted within this dictation.    No role for labs today.  I personally reviewed and went over radiographic studies with the patient.  The results are noted within this dictation.  MRI brain report is reviewed with the patient.  His case has been discussed with Dr. Lisbeth Renshaw.    Plan is to have his case presented at brain tumor board for further recommendations.  For now, HOLD Temodar.  Return in ~ 2 weeks for follow-up and discuss medical oncology recommendations.   ORDERS PLACED FOR THIS ENCOUNTER: No orders of the defined types were placed in this encounter.   MEDICATIONS PRESCRIBED THIS ENCOUNTER: Meds ordered this encounter  Medications  . VITAMIN D3 HIGH POTENCY 1000 units capsule    Sig: Take 1,000 Units by mouth daily.    Refill:  0    THERAPY PLAN:  HOLD Temodar, await tumor board recommendations.  All questions were answered. The patient knows to call the clinic with any problems, questions or concerns. We can certainly see the patient much sooner if necessary.  Patient and plan discussed with Dr. Ancil Linsey and she is in agreement with the aforementioned.   This note is electronically signed by: Robynn Pane, PA-C 08/31/2016 2:22 PM

## 2016-09-02 ENCOUNTER — Other Ambulatory Visit (HOSPITAL_COMMUNITY): Payer: Self-pay | Admitting: Oncology

## 2016-09-14 ENCOUNTER — Encounter: Payer: Self-pay | Admitting: Dietician

## 2016-09-14 ENCOUNTER — Encounter (HOSPITAL_COMMUNITY): Payer: Medicare Other | Attending: Hematology & Oncology | Admitting: Hematology & Oncology

## 2016-09-14 ENCOUNTER — Encounter (HOSPITAL_COMMUNITY): Payer: Self-pay | Admitting: Hematology & Oncology

## 2016-09-14 VITALS — BP 126/73 | HR 79 | Temp 97.9°F | Resp 16 | Wt 160.0 lb

## 2016-09-14 DIAGNOSIS — Z87891 Personal history of nicotine dependence: Secondary | ICD-10-CM | POA: Insufficient documentation

## 2016-09-14 DIAGNOSIS — R110 Nausea: Secondary | ICD-10-CM

## 2016-09-14 DIAGNOSIS — Z9221 Personal history of antineoplastic chemotherapy: Secondary | ICD-10-CM | POA: Insufficient documentation

## 2016-09-14 DIAGNOSIS — T380X5A Adverse effect of glucocorticoids and synthetic analogues, initial encounter: Secondary | ICD-10-CM | POA: Insufficient documentation

## 2016-09-14 DIAGNOSIS — E785 Hyperlipidemia, unspecified: Secondary | ICD-10-CM | POA: Insufficient documentation

## 2016-09-14 DIAGNOSIS — Z923 Personal history of irradiation: Secondary | ICD-10-CM | POA: Insufficient documentation

## 2016-09-14 DIAGNOSIS — Z79899 Other long term (current) drug therapy: Secondary | ICD-10-CM | POA: Insufficient documentation

## 2016-09-14 DIAGNOSIS — R634 Abnormal weight loss: Secondary | ICD-10-CM | POA: Diagnosis not present

## 2016-09-14 DIAGNOSIS — Z794 Long term (current) use of insulin: Secondary | ICD-10-CM | POA: Insufficient documentation

## 2016-09-14 DIAGNOSIS — E099 Drug or chemical induced diabetes mellitus without complications: Secondary | ICD-10-CM | POA: Insufficient documentation

## 2016-09-14 DIAGNOSIS — C719 Malignant neoplasm of brain, unspecified: Secondary | ICD-10-CM | POA: Diagnosis present

## 2016-09-14 DIAGNOSIS — E2740 Unspecified adrenocortical insufficiency: Secondary | ICD-10-CM | POA: Insufficient documentation

## 2016-09-14 DIAGNOSIS — D696 Thrombocytopenia, unspecified: Secondary | ICD-10-CM | POA: Insufficient documentation

## 2016-09-14 DIAGNOSIS — I10 Essential (primary) hypertension: Secondary | ICD-10-CM | POA: Insufficient documentation

## 2016-09-14 NOTE — Patient Instructions (Addendum)
[place] at [facility] Discharge Instructions  RECOMMENDATIONS MADE BY THE CONSULTANT AND ANY TEST RESULTS WILL BE SENT TO YOUR REFERRING PHYSICIAN.  You saw Dr.Penland today. Anderson Malta, nurse navigator, will talk to you about Avastin. See Amy at checkout for appointments.  Thank you for choosing [place] at [facility] to provide your oncology and hematology care.  To afford each patient quality time with our provider, please arrive at least 15 minutes before your scheduled appointment time.   Beginning January 23rd 2017 lab work for the [facility] will be done in the  Main lab at [facility] on 1st floor. If you have a lab appointment with the [facility] please come in thru the  Main Entrance and check in at the main information desk  You need to re-schedule your appointment should you arrive 10 or more minutes late.  We strive to give you quality time with our providers, and arriving late affects you and other patients whose appointments are after yours.  Also, if you no show three or more times for appointments you may be dismissed from the clinic at the providers discretion.     Again, thank you for choosing [facility].  Our hope is that these requests will decrease the amount of time that you wait before being seen by our physicians.       _____________________________________________________________  Should you have questions after your visit to [facility], please contact our office at [phone] between the hours of 8:30 a.m. and 4:30 p.m.  Voicemails left after 4:30 p.m. will not be returned until the following business day.  For prescription refill requests, have your pharmacy contact our office.         Resources For Cancer Patients and their Caregivers ? American Cancer Society: Can assist with transportation, wigs, general needs, runs Look Good Feel Better.        740 173 6872 ? Cancer  Care: Provides financial assistance, online support groups, medication/co-pay assistance.  1-800-813-HOPE 6714502328) ? [facility] Assists [place] cancer patients and their families through emotional , educational and financial support.  952-551-2990 ? [place] DSS Where to apply for food stamps, Medicaid and utility assistance. 787-820-0446 ? [facility]: Transportation to medical appointments. 631-222-3542 ? Social Security Administration: May apply for disability if have a Stage IV cancer. 445-035-1472 9791822681 ? [employer], Disability and Transit Services: Assists with nutrition, care and transit needs. [place] Support Programs: @10RELATIVEDAYS @ > Cancer Support Group  2nd Tuesday of the month 1pm-2pm, Journey Room  > Creative Journey  3rd Tuesday of the month 1130am-1pm, Journey Room  > Look Good Feel Better  1st Wednesday of the month 10am-12 noon, Journey Room (Call [place] to register [phone])

## 2016-09-14 NOTE — Progress Notes (Signed)
Met with pt face to face.  Introduced myself and explain a little bit about my role as the patient navigator.  Pt given a card with all my information on it.  Told pt to call if they had any questions or concerns.  Pt verbalized understanding.  Set pt up for teaching on 09/19/2016 at 11:30, and first chemotherapy 09/20/2016.  Pt verbalized understanding.  All questions answered.

## 2016-09-14 NOTE — Progress Notes (Signed)
HEMATOLOGY/ONCOLOGY PROGRESS NOTE  Date of Service: 09/14/2016  Patient Care Team: [name], MD as PCP - General (Internal Medicine)  CHIEF COMPLAINTS/PURPOSE OF CONSULTATION:   Continued treatment of GBM.    Glioblastoma multiforme of brain ([name])   07/19/2016 Treatment Plan Change    Dose of Temodar reduced to 150 mg/m2      07/19/2016 Adverse Reaction    Weight loss, poor tolerance, thrombocytopenia secondary to Temodar 200 mg/m2      07/25/2016 Treatment Plan Change    Treatment held x 1 week      07/25/2016 Imaging    [place] LE B/L- No evidence of DVT within either lower extremity.      08/01/2016 Echocardiogram    Left ventricle: The cavity size was normal. Wall thickness was normal. Systolic function was normal. The estimated ejection fraction was in the range of 50% to 55%. Wall motion was normal; there were no regional wall motion abnormalities. Doppler parameters are consistent with abnormal left ventricular relaxation (grade 1 diastolic dysfunction). Doppler parameters are consistent with high ventricular filling pressure. - Left atrium: The atrium was mildly dilated. - Tricuspid valve: There was mild regurgitation.      08/04/2016 - 08/27/2016 Chemotherapy    Temodar 150 mg/m2 (beginning on 10/21)       08/29/2016 Imaging    MRI brain- 1. New marginal enhancement and increased signal abnormality around the right parietal glioblastoma resection cavity compatible with progression. Cannot exclude radiation changes. 2. There is new 2 mm focus of white matter enhancement in the parasagittal right frontal lobe. This is primarily concerning for distant site of disease progression, need correlation with radiation dose distribution.       HISTORY OF PRESENTING ILLNESS:  Phillip Mckay is a wonderful 75 y.o. male who presents for ongoing management of GBM; Right parietal lobe Glioblastoma Multiforme, grade 4, unknown methylation  status of MGMT, S/P partial resection on 10/25/2015 by Dr. Owens Shark with removal of 80% of tumor followed by concomitant Temodar + XRT.  He started his first cycle of adjuvant temozolomide on 02/21/2016.  Future cycles werre complicated by Keppra-induced thrombocytopenia (without documented seizure activity) and therefore, Keppra was discontinued on 04/27/2016 with significant improvement in platelet count and dose escalation of Temodar to 200 mg/m2.  However, performance status and blood counts declined requiring a change back to 150 mg/m2 of Temodar on 08/02/2016.  Temodar discontinued on 08/27/2016 due to ongoing and significant weight loss and progression of disease on MRI imaging.  Dr. Lisbeth Renshaw is his radiation oncologist.  His nausea and vomiting has been much better since discontinuing his temodar. His wife says that he has been eating a little better, but his appetite is still not good. He has lost weight.   He is in a wheelchair today, but he uses a walker at home. He denies any falls.   He sleeps in a hospital bed at night.   He occasionally gets sharp pain in his abdomen. It comes and goes. No nausea or vomiting.   Denies leg swelling.   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 In his wife have 2 sons and they adopted one daughter [name] is with him today.  #4 history of hypertension #5 history of dyslipidemia  SURGICAL HISTORY:   10/25/2015 Surgery  Subtotal resection Dr.  Brown   SOCIAL HISTORY: Social History   Social History  . Marital status: Married    Spouse name: N/A  . Number of children: N/A  . Years of education: N/A   Occupational History  . Not on file.   Social History Main Topics  . Smoking status: Former Research scientist (life sciences)  . Smokeless tobacco: Never Used  . Alcohol use No  . Drug use: No  . Sexual  activity: No   Other Topics Concern  . Not on file   Social History Narrative  . No narrative on file  former smoker quit at age 55 yo Worked as a Pharmacist, community for about 40 years.  FAMILY HISTORY: Family History  Problem Relation Age of Onset  . Cancer Mother   . Diabetes Mother   . Cancer Father   . Cancer Brother     ALLERGIES:  has No Known Allergies.  MEDICATIONS:  Current Outpatient Prescriptions  Medication Sig Dispense Refill  . B Complex-C ([facility] B COMPLEX PLUS C) TABS TAKE ONE CAPSULE BY MOUTH EVERY DAY 30 tablet 2  . cholecalciferol 2000 units tablet Take 1 tablet (2,000 Units total) by mouth daily. 30 tablet 3  . clotrimazole-betamethasone (LOTRISONE) cream Apply 1 application topically 2 (two) times daily. 30 g 0  . Docusate Sodium 100 MG capsule Take by mouth.    . gabapentin (NEURONTIN) 300 MG capsule Reported on 04/20/2016    . Glycerin-Hypromellose-PEG 400 0.2-0.2-1 % SOLN Apply to eye. Reported on 04/20/2016    . latanoprost (XALATAN) 0.005 % ophthalmic solution Apply to eye. Reported on 04/20/2016    . lisinopril (PRINIVIL,ZESTRIL) 40 MG tablet     . LORazepam (ATIVAN) 1 MG tablet Take as needed for seizure. Take 1 mg at onset may repeat in 5 minute intervals X 3 30 tablet 3  . metFORMIN (GLUCOPHAGE) 1000 MG tablet Take by mouth.    Marland Kitchen omeprazole (PRILOSEC) 20 MG capsule     . ondansetron (ZOFRAN) 8 MG tablet Take 1 tablet (8 mg total) by mouth every 8 (eight) hours as needed for nausea or vomiting. 30 tablet 2  . pantoprazole (PROTONIX) 40 MG tablet Take 1 tablet (40 mg total) by mouth daily. 30 tablet 1  . potassium chloride SA (K-DUR,KLOR-CON) 20 MEQ tablet Take 2 tablets (40 mEq total) by mouth daily. 60 tablet 1  . Probiotic Product [facility]) CAPS Take by mouth.    . prochlorperazine (COMPAZINE) 10 MG tablet Take 1 tablet (10 mg total) by mouth every 6 (six) hours as needed for nausea or vomiting. Reported on 04/20/2016 30 tablet 2  . simvastatin (ZOCOR) 20 MG tablet  Reported on 04/20/2016    . tamsulosin (FLOMAX) 0.4 MG CAPS capsule Take by mouth.    . temozolomide (TEMODAR) 140 MG capsule Take 2 capsules (280 mg total) by mouth daily. May take on an empty stomach or at bedtime to decrease nausea & vomiting. TAKE for 5 days every 28 days 10 capsule 1  . tetrahydrozoline 0.05 % ophthalmic solution Reported on 04/20/2016    . traMADol (ULTRAM) 50 MG tablet Take 1 tablet (50 mg total) by mouth every 6 (six) hours as needed. 60 tablet 2  . TRAVATAN Z 0.004 % SOLN ophthalmic solution     . travoprost, benzalkonium, (TRAVATAN) 0.004 % ophthalmic solution 1 drop at bedtime.    Marland Kitchen VITAMIN D3 HIGH POTENCY 1000 units capsule Take 1,000 Units by mouth daily.  0   No current facility-administered medications for this visit.  REVIEW OF SYSTEMS:   Review of Systems  Constitutional: Positive for weight loss.       Bad appetite.   HENT: Negative.   Eyes: Negative.   Respiratory: Negative.   Cardiovascular: Negative.  Negative for leg swelling.  Gastrointestinal: Positive for abdominal pain (sharp). Negative for nausea and vomiting.  Genitourinary: Negative.   Musculoskeletal: Negative.   Skin: Negative.   Neurological: Negative.   Endo/Heme/Allergies: Negative.   Psychiatric/Behavioral: Negative.   All other systems reviewed and are negative. 14 point review of systems was performed and is negative except as detailed under history of present illness and above   PHYSICAL EXAMINATION: ECOG PERFORMANCE STATUS: 2 - Symptomatic, <50% confined to bed   Vitals:   09/14/16 1518  BP: 126/73  Pulse: 79  Resp: 16  Temp: 97.9 F (36.6 C)   Filed Weights   09/14/16 1518  Weight: 160 lb (72.6 kg)   .Body mass index is 25.06 kg/m.   Physical Exam  Constitutional: He is oriented to person, place, and time and well-developed, well-nourished, and in no distress.  Pt was able to get on exam table with assistance.  Ambulates with walker and/or wheelchair.    HENT:  Head: Normocephalic and atraumatic.  Mouth/Throat: Oropharynx is clear and moist.  Eyes: Conjunctivae and EOM are normal. Pupils are equal, round, and reactive to light.  Neck: Normal range of motion. Neck supple.  Cardiovascular: Normal rate, regular rhythm and normal heart sounds.   Pulmonary/Chest: Effort normal and breath sounds normal.  Abdominal: Soft. Bowel sounds are normal. He exhibits no distension and no mass. There is no tenderness. There is no rebound and no guarding.  Musculoskeletal: Normal range of motion.  Lymphadenopathy:    He has no cervical adenopathy.  Neurological: He is alert and oriented to person, place, and time. Gait normal.  Skin: Skin is warm and dry.  Psychiatric: Mood and affect normal.  Nursing note and vitals reviewed.   LABORATORY DATA:  I have reviewed the data as listed  . CBC Latest Ref Rng & Units 08/27/2016 08/10/2016 08/01/2016  WBC 4.0 - 10.5 K/uL 4.1 3.8(L) 3.6(L)  Hemoglobin 13.0 - 17.0 g/dL 12.2(L) 11.1(L) 11.2(L)  Hematocrit 39.0 - 52.0 % 35.4(L) 32.5(L) 32.3(L)  Platelets 150 - 400 K/uL 133(L) 117(L) 151   . CMP Latest Ref Rng & Units 08/27/2016 08/10/2016 08/01/2016  Glucose 65 - 99 mg/dL 103(H) 91 106(H)  BUN 6 - 20 mg/dL 9 7 9   Creatinine 0.61 - 1.24 mg/dL 0.92 1.00 0.83  Sodium 135 - 145 mmol/L 141 140 140  Potassium 3.5 - 5.1 mmol/L 4.2 4.1 3.0(L)  Chloride 101 - 111 mmol/L 108 107 105  CO2 22 - 32 mmol/L 26 28 29   Calcium 8.9 - 10.3 mg/dL 9.6 9.4 9.1  Total Protein 6.5 - 8.1 g/dL 6.4(L) 6.0(L) 5.9(L)  Total Bilirubin 0.3 - 1.2 mg/dL 1.1 1.0 1.0  Alkaline Phos 38 - 126 U/L 33(L) 33(L) 33(L)  AST 15 - 41 U/L 25 26 25   ALT 17 - 63 U/L 14(L) 17 18    Final Diagnosis  Brain, right parietal, biopsies (specimens 1-2): - Glioblastoma, WHO grade 4; see comment.  Electronically signed by Golden Pop, MD on 10/26/2015 at [facility]: I have personally reviewed the radiological images as listed  and agreed with the findings in the report. Mr [name] Wo Contrast  Result Date: 08/29/2016 CLINICAL DATA:  Right parietal glioblastoma status post subtotal resection January  2017. Subsequent radiation and Temodar. No reported change in steroids. EXAM: MRI HEAD WITHOUT AND WITH CONTRAST TECHNIQUE: Multiplanar, multiecho pulse sequences of the brain and surrounding structures were obtained without and with intravenous contrast. CONTRAST:  51mL MULTIHANCE GADOBENATE DIMEGLUMINE 529 MG/ML IV SOLN COMPARISON:  04/16/2016 FINDINGS: Brain: Right parietal glioblastoma resection site shows concerning new marginal enhancement that has thick lobulated appearance. The cavity with internal chronic blood products and peripheral enhancement measures up to 39 by 31 mm on axial postcontrast imaging, previously 31 x 23 mm. Surrounding T2 signal abnormality is also increased, with local mass effect causing sulcal narrowing. No focal restricted diffusion typical of cellular tumor. Limited around the margin of the resection cavity where there is artifact from blood products. There is a 2 mm focus of enhancement in the right frontal white matter, parasagittal, that is contiguous with the white matter hyperintensity. No acute infarct, hemorrhage, hydrocephalus, or herniation. Remote left suboccipital craniotomy with peripheral cerebellar scarring, reportedly for acoustic neuroma resection remotely. Vascular: Preserved flow voids Skull and upper cervical spine: Negative Sinuses/Orbits: Negative IMPRESSION: 1. New marginal enhancement and increased signal abnormality around the right parietal glioblastoma resection cavity compatible with progression. Cannot exclude radiation changes. 2. There is new 2 mm focus of white matter enhancement in the parasagittal right frontal lobe. This is primarily concerning for distant site of disease progression, need correlation with radiation dose distribution. Electronically Signed   By: Monte Fantasia M.D.   On: 08/29/2016 11:31   Study Result   CLINICAL DATA:  Right parietal glioblastoma status post subtotal resection January 2017. Subsequent radiation and Temodar. No reported change in steroids.  EXAM: MRI HEAD WITHOUT AND WITH CONTRAST  TECHNIQUE: Multiplanar, multiecho pulse sequences of the brain and surrounding structures were obtained without and with intravenous contrast.  CONTRAST:  46mL MULTIHANCE GADOBENATE DIMEGLUMINE 529 MG/ML IV SOLN  COMPARISON:  04/16/2016  FINDINGS: Brain: Right parietal glioblastoma resection site shows concerning new marginal enhancement that has thick lobulated appearance. The cavity with internal chronic blood products and peripheral enhancement measures up to 39 by 31 mm on axial postcontrast imaging, previously 31 x 23 mm. Surrounding T2 signal abnormality is also increased, with local mass effect causing sulcal narrowing. No focal restricted diffusion typical of cellular tumor. Limited around the margin of the resection cavity where there is artifact from blood products. There is a 2 mm focus of enhancement in the right frontal white matter, parasagittal, that is contiguous with the white matter hyperintensity.  No acute infarct, hemorrhage, hydrocephalus, or herniation.  Remote left suboccipital craniotomy with peripheral cerebellar scarring, reportedly for acoustic neuroma resection remotely.  Vascular: Preserved flow voids  Skull and upper cervical spine: Negative  Sinuses/Orbits: Negative  IMPRESSION: 1. New marginal enhancement and increased signal abnormality around the right parietal glioblastoma resection cavity compatible with progression. Cannot exclude radiation changes. 2. There is new 2 mm focus of white matter enhancement in the parasagittal right frontal lobe. This is primarily concerning for distant site of disease progression, need correlation with radiation dose  distribution.   Electronically Signed   By: Monte Fantasia M.D.   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 Completed concurrent temozolomide and radiation therapy. Received his first cycle of adjuvant temozolomide on  02/21/2016. Treatment course is documented above. He has evidence of disease progression. Current recommendation is to proceed with single agent Avastin.   He will start Avastin next week. We discussed the possible side effects of Avastin. He will get this every 2 weeks.  We will arrange for formal chemotherapy teaching prior to beginning therapy.   Temodar is permanently discontinued secondary to progression and also poor tolerance.   He will continue to follow with radiation oncology.   He will return for a follow up next week after he has started Avastin. All questions were answered.   All of the patients questions were answered with apparent satisfaction. The patient knows to call the clinic with any problems, questions or concerns.  This document serves as a record of services personally performed by Ancil Linsey, MD. It was created on her behalf by Martinique Casey, a trained medical scribe. The creation of this record is based on the scribe's personal observations and the provider's statements to them. This document has been checked and approved by the attending provider.  I have reviewed the above documentation for accuracy and completeness, and I agree with the above. Molli Hazard, MD  09/14/2016 3:31 PM

## 2016-09-14 NOTE — Progress Notes (Signed)
START ON PATHWAY REGIMEN - Neuro  [id number]: Bevacizumab 10 mg/kg q14 Days   A cycle is every 14 days:     Bevacizumab (Avastin(R)) 10 mg/kg in 100 mL NS IV over 90 minutes first infusion, 60 minutes second infusion and 30 minutes all subsequent infusions if tolerated, on day 1 only. Dose Mod: None Additional Orders: Ref: Alford Highland T et al. J Clin Oncol [phone]  **Always confirm dose/schedule in your pharmacy ordering system**    Patient Characteristics: Glioblastoma, Anaplastic Astrocytoma, and Anaplastic Oligodendroglioma, Recurrent or Progressive, Nonsurgical Candidate, Systemic Therapy Candidate Disease Status: Recurrent or Progressive Disease Classification: Glioblastoma Treatment Classification: Nonsurgical Candidate Treatment (Nonsurgical/Adjuvant): Systemic Therapy Candidate  Intent of Therapy: Non-Curative / Palliative Intent, Discussed with Patient

## 2016-09-14 NOTE — Progress Notes (Signed)
Follow up High Risk Cancer Pt. Has Glioblastoma multiforme of brain  Contacted Pt by visiting him before his appointment.   His Temodar was stopped on 11/17 due to FTT and weight loss.   Wt Readings from Last 10 Encounters:  09/14/16 160 lb (72.6 kg)  08/31/16 161 lb (73 kg)  08/27/16 161 lb 11.2 oz (73.3 kg)  08/10/16 167 lb 14.4 oz (76.2 kg)  07/19/16 170 lb 3.2 oz (77.2 kg)  07/02/16 169 lb 9.6 oz (76.9 kg)  06/21/16 170 lb 1.6 oz (77.2 kg)  06/14/16 173 lb 1.6 oz (78.5 kg)  05/17/16 183 lb 1.6 oz (83.1 kg)  05/04/16 179 lb 1.6 oz (81.2 kg)   Patient weight has fallen by another lb.   Pt reports that his nausea and stomach pain are much improved. He also is going to the bathroom less frequently at night.   Unfortunately, his appetite is still poor. Mostly secondary to his taste changes. What makes managing these so difficulty is that the specific changes vary day to day. What tastes good to him one day does not taste good the next day. For this reason, his PO intake will be good for a few days, then it will be poor for the next few.   He is still drinking inadequate amounts of fluid. He drinks 8 oz of water before breakfast. He will get up at night and drink a few gulps then. During the day fluids are minimal. He no longer likes the Ensure Clear- Apple. He says he may try buttermilk.   MD entered soon after. No education offered this date. Will follow up at later date.   Burtis Junes RD, LDN, [facility] Pager: [phone] 09/14/2016 4:21 PM

## 2016-09-16 ENCOUNTER — Other Ambulatory Visit (HOSPITAL_COMMUNITY): Payer: Self-pay | Admitting: Hematology & Oncology

## 2016-09-16 DIAGNOSIS — C719 Malignant neoplasm of brain, unspecified: Secondary | ICD-10-CM

## 2016-09-17 ENCOUNTER — Encounter (HOSPITAL_COMMUNITY): Payer: Self-pay | Admitting: Emergency Medicine

## 2016-09-17 NOTE — Progress Notes (Signed)
Chemotherapy education pulled together.  Labs entered.  Doctors/chemo appts made.

## 2016-09-17 NOTE — Patient Instructions (Signed)
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  We are switching your therapy to Avastin every 2 weeks.  This treatment is with palliative intent, which means that you are treatable not curable.  Your first infusion will be 1.5 hours.  The second infusion will be 1 hour.  The third and subsequent infusions will be 30 minutes.  We will always check a urine prior to administering Avastin.   You will see the doctor regularly throughout treatment.  We monitor your lab work prior to every treatment.  The doctor monitors your response to treatment by the way you are feeling, your blood work, and scans periodically.   POTENTIAL SIDE EFFECTS OF TREATMENT:  Bevacizumab (Avastin)  About This Drug Bevacizumab is used to treat cancer. It is given in the vein (IV).  Possible Side Effects . Nosebleed . Headache . Teary eyes . Runny/stuffy nose . Changes in the way food and drinks taste . Bleeding in your rectum . Protein in your urine, which can affect how your kidneys work . Dry skin . A red skin rash which can be peeling or scaling . Back pain . High Blood Pressure Note: Each of the side effects above was reported in 10% or greater of patients treated with bevacizumab. Not all possible side effects are included above.  Warnings and Precautions . Perforation or fistula- an abnormal hole in your stomach, intestine, esophagus, or other organ, which may be life-threatening . Slow wound healing . Abnormal bleeding which may be life-threatening - symptoms may be coughing up blood, throwing up blood (may look like coffee grounds), red or black tarry bowel movements, abnormally heavy menstrual flow, nosebleeds or any other unusual bleeding. . Blood clots and events such as stroke and heart attack. A blood clot in your leg may cause your leg to swell, appear red and warm, and/or cause pain. A blood clot in your lungs may cause  trouble breathing, pain when breathing, and/or chest pain. . Severe high blood pressure . Changes in your central nervous system can happen. The central nervous system is made up of your brain and spinal cord. You could feel extreme tiredness, agitation, confusion, hallucinations (see or hear things that are not there), trouble understanding or speaking, loss of control of your bowels or bladder, eyesight changes, numbness or lack of strength to your arms, legs, face, or body, and coma. If you start to have any of these symptoms let your doctor know right away. . Proteins in your urine, which can rarely cause kidney failure . While you are getting this drug in your vein (IV), you may have a reaction to the drug. Sometimes you may be given medication to stop or lessen these side effects. Your nurse will check you closely for these signs: fever or shaking chills, flushing, facial swelling, feeling dizzy, headache, trouble breathing, rash, itching, chest tightness, or chest pain. These reactions may happen for 24 hours after your infusion. If this happens, call 911 for emergency care. . In women, changes in your ovaries may happen that may cause menstrual bleeding to become irregular or stop, and may impair fertility Note: Some of the side effects above are very rare. If you have concerns and/or questions, please discuss them with your medical team.  Important Information . Bevacizumab may cause slow wound healing. It should not be given within 28 days of surgery or test or procedure that needs conscious sedation. If you must  have emergency surgery or have an accident that results in a wound, tell the doctor that you are on bevacizumab. Call your cancer doctor as soon as possible for further orders. . This drug may be present in the saliva, tears, sweat, urine, stool, vomit, semen, and vaginal secretions. Talk to your doctor and/or your nurse about the necessary precautions to take during this  time.  Treating Side Effects . Keeping your pain under control is important to your well-being. Please tell your doctor or nurse if you are experiencing pain . Taking good care of your mouth may help food taste better and improve your appetite . If you have a nose bleed, sit with your head tipped slightly forward. Apply pressure by lightly pinching the bridge of your nose between your thumb and forefinger. Call your doctor if you feel dizzy or faint or if the bleeding doesn't stop after 10 to 15 minutes. . To help with dry skin, moisturize your skin several times day . Avoid sun exposure and apply sunscreen routinely when outdoors . If you get a rash do not put anything on it unless your doctor or nurse says you may. Keep the area around the rash clean and dry. Ask your doctor for medicine if your rash bothers you. . Infusion reactions may happen for 24 hours after your infusion. If this happens, call 911 for emergency care.  Food and Drug Interactions . There are no known interactions of bevacizumab with food . This drug may interact with other medicines. Tell your doctor and pharmacist about all the medicines and dietary supplements (vitamins, minerals, herbs and others) that you are taking at this time. The safety and use of dietary supplements and alternative diets are often not known. Using these might affect your cancer or interfere with your treatment. Until more is known, you should not use dietary supplements or alternative diets without your cancer doctor's help.  When to Call the Doctor Call your doctor or nurse if you have any of these symptoms and/or any new or unusual symptoms: . Fever of 100.5 F (38 C) or higher . Chills . Wheezing and/or trouble breathing . Difficulty swallowing . Easy bleeding or bruising . Blood in your urine, vomit (bright red or coffee-ground) and/or stools ( bright red, or black/tarry) . Coughing up blood . Confusion and/or agitation . Hallucinations .  Trouble understanding or speaking . Headache that does not go away . Nose bleed that doesn't stop bleeding after 10 -15 minutes . Feeling dizzy or lightheaded . Blurry vision or changes in your eyesight . Numbness or lack of strength to your arms, legs, face, or body . Nausea that stops you from eating or drinking or relieved by prescribed medicine . Throwing up more than 3 times a day . Pain in your abdomen that does not go away . Signs of infusion reaction: fever or shaking chills, flushing, facial swelling, feeling dizzy, headache, trouble breathing, rash, itching, chest tightness, or chest pain. . Pain that does not go away or is not relieved by prescribed medicine . Your leg or arm is swollen, red, warm and/or painful . Chest pain or symptoms of a heart attack. Most heart attacks involve pain in the center of the chest that lasts more than a few minutes. The pain may go away and come back. It can feel like pressure, squeezing, fullness, or pain. Sometimes pain is felt in one or both arms, the back, neck, jaw, or stomach. If any of these symptoms last 2  minutes, call 911. Marland Kitchen Symptoms of a stroke such as sudden numbness or weakness of your face, arm, or leg, mostly on one side of your body; sudden confusion, trouble speaking or understanding; sudden trouble seeing in one or both eyes; sudden trouble walking, feeling dizzy, loss of balance or coordination; or sudden, bad headache with no known cause. If you have any of these symptoms for 2 minutes, call 911. . If you think you may be pregnant  Reproduction Warnings . Pregnancy warning: This drug can have harmful effects on the unborn baby. Women of child bearing potential should use effective methods of birth control during your cancer treatment and for at least 6 months after treatment. Let your doctor know right away if you think you may be pregnant . Breastfeeding warning: Women should not breast feed during treatment because this drug could  enter the breast milk and cause harm to a breast feeding baby. . Fertility warning: This drug may affect your ability to have children in the future. Talk with your doctor or nurse if you plan to have children. Ask for information on egg banking.    EDUCATIONAL MATERIALS GIVEN AND REVIEWED: Chemotherapy and you book given, nutrition book, information on Avastin.   SELF CARE ACTIVITIES WHILE ON CHEMOTHERAPY:  Hydration Increase your fluid intake 48 hours prior to treatment and drink at least 8 to 12 cups (64 ounces) of water/decaff beverages per day after treatment. You can still have your cup of coffee or soda but these beverages do not count as part of your 8 to 12 cups that you need to drink daily. No alcohol intake.  Medications Continue taking your normal prescription medication as prescribed.  If you start any new herbal or new supplements please let us know first to make sure it is safe.  Mouth Care Have teeth cleaned professionally before starting treatment. Keep dentures and partial plates clean. Use soft toothbrush and do not use mouthwashes that contain alcohol. Biotene is a good mouthwash that is available at most pharmacies or may be ordered by calling [phone]. Use warm salt water gargles (1 teaspoon salt per 1 quart warm water) before and after meals and at bedtime. Or you may rinse with 2 tablespoons of three-percent hydrogen peroxide mixed in eight ounces of water. If you are still having problems with your mouth or sores in your mouth please call the clinic. If you need dental work, please let Dr. Whitney Muse know before you go for your appointment so that we can coordinate the best possible time for you in regards to your chemo regimen. You need to also let your dentist know that you are actively taking chemo. We may need to do labs prior to your dental appointment.   Skin Care Always use sunscreen that has not expired and with SPF (Sun Protection Factor) of 50 or higher.  Wear hats to protect your head from the sun. Remember to use sunscreen on your hands, ears, face, & feet.  Use good moisturizing lotions such as udder cream, eucerin, or even Vaseline. Some chemotherapies can cause dry skin, color changes in your skin and nails.    . Avoid long, hot showers or baths. . Use gentle, fragrance-free soaps and laundry detergent. . Use moisturizers, preferably creams or ointments rather than lotions because the thicker consistency is better at preventing skin dehydration. Apply the cream or ointment within 15 minutes of showering. Reapply moisturizer at night, and moisturize your hands every time after you wash them.  Hair Loss (if your doctor says your hair will fall out)  . If your doctor says that your hair is likely to fall out, decide before you begin chemo whether you want to wear a wig. You may want to shop before treatment to match your hair color. . Hats, turbans, and scarves can also camouflage hair loss, although some people prefer to leave their heads uncovered. If you go bare-headed outdoors, be sure to use sunscreen on your scalp. . Cut your hair short. It eases the inconvenience of shedding lots of hair, but it also can reduce the emotional impact of watching your hair fall out. . Don't perm or color your hair during chemotherapy. Those chemical treatments are already damaging to hair and can enhance hair loss. Once your chemo treatments are done and your hair has grown back, it's OK to resume dyeing or perming hair. With chemotherapy, hair loss is almost always temporary. But when it grows back, it may be a different color or texture. In older adults who still had hair color before chemotherapy, the new growth may be completely gray.  Often, new hair is very fine and soft.  Infection Prevention Please wash your hands for at least 30 seconds using warm soapy water. Handwashing is the #1 way to prevent the spread of germs. Stay away from sick people or people  who are getting over a cold. If you develop respiratory systems such as green/yellow mucus production or productive cough or persistent cough let us know and we will see if you need an antibiotic. It is a good idea to keep a pair of gloves on when going into grocery stores/[employer] to decrease your risk of coming into contact with germs on the carts, etc. Carry alcohol hand gel with you at all times and use it frequently if out in public. If your temperature reaches 100.5 or higher please call the clinic and let us know.  If it is after hours or on the weekend please go to the ER if your temperature is over 100.5.  Please have your own personal thermometer at home to use.    Sex and bodily fluids If you are going to have sex, a condom must be used to protect the person that isn't taking chemotherapy. Chemo can decrease your libido (sex drive). For a few days after chemotherapy, chemotherapy can be excreted through your bodily fluids.  When using the toilet please close the lid and flush the toilet twice.  Do this for a few day after you have had chemotherapy.     Effects of chemotherapy on your sex life Some changes are simple and won't last long. They won't affect your sex life permanently. Sometimes you may feel: . too tired . not strong enough to be very active . sick or sore  . not in the mood . anxious or low Your anxiety might not seem related to sex. For example, you may be worried about the cancer and how your treatment is going. Or you may be worried about money, or about how you family are coping with your illness. These things can cause stress, which can affect your interest in sex. It's important to talk to your partner about how you feel. Remember - the changes to your sex life don't usually last long. There's usually no medical reason to stop having sex during chemo. The drugs won't have any long term physical effects on your performance or enjoyment of sex. Cancer can't be passed on to  your partner during sex  Contraception It's important to use reliable contraception during treatment. Avoid getting pregnant while you or your partner are having chemotherapy. This is because the drugs may harm the baby. Sometimes chemotherapy drugs can leave a man or woman infertile.  This means you would not be able to have children in the future. You might want to talk to someone about permanent infertility. It can be very difficult to learn that you may no longer be able to have children. Some people find counselling helpful. There might be ways to preserve your fertility, although this is easier for men than for women. You may want to speak to a fertility expert. You can talk about sperm banking or harvesting your eggs. You can also ask about other fertility options, such as donor eggs. If you have or have had breast cancer, your doctor might advise you not to take the contraceptive pill. This is because the hormones in it might affect the cancer.  It is not known for sure whether or not chemotherapy drugs can be passed on through semen or secretions from the vagina. Because of this some doctors advise people to use a barrier method if you have sex during treatment. This applies to vaginal, anal or oral sex. Generally, doctors advise a barrier method only for the time you are actually having the treatment and for about a week after your treatment. Advice like this can be worrying, but this does not mean that you have to avoid being intimate with your partner. You can still have close contact with your partner and continue to enjoy sex.  Animals If you have cats or birds we just ask that you not change the litter or change the cage.  Please have someone else do this for you while you are on chemotherapy.   Food Safety During and After Cancer Treatment Food safety is important for people both during and after cancer treatment. Cancer and cancer treatments, such as chemotherapy, radiation therapy,  and stem cell/bone marrow transplantation, often weaken the immune system. This makes it harder for your body to protect itself from foodborne illness, also called food poisoning. Foodborne illness is caused by eating food that contains harmful bacteria, parasites, or viruses.  Foods to avoid Some foods have a higher risk of becoming tainted with bacteria. These include: Marland Kitchen Unwashed fresh fruit and vegetables, especially leafy vegetables that can hide dirt and other contaminants . Raw sprouts, such as alfalfa sprouts . Raw or undercooked beef, especially ground beef, or other raw or undercooked meat and poultry . Fatty, fried, or spicy foods immediately before or after treatment.  These can sit heavy on your stomach and make you feel nauseous. . Raw or undercooked shellfish, such as oysters. . Sushi and sashimi, which often contain raw fish.  . Unpasteurized beverages, such as unpasteurized fruit juices, raw milk, raw yogurt, or cider . Undercooked eggs, such as soft boiled, over easy, and poached; raw, unpasteurized eggs; or foods made with raw egg, such as homemade raw cookie dough and homemade mayonnaise Simple steps for food safety Shop smart. . Do not buy food stored or displayed in an unclean area. . Do not buy bruised or damaged fruits or vegetables. . Do not buy cans that have cracks, dents, or bulges. . Pick up foods that can spoil at the end of your shopping trip and store them in a cooler on the way home. Prepare and clean up foods carefully. . Rinse all fresh fruits and  vegetables under running water, and dry them with a clean towel or paper towel. . Clean the top of cans before opening them. . After preparing food, wash your hands for 20 seconds with hot water and soap. Pay special attention to areas between fingers and under nails. . Clean your utensils and dishes with hot water and soap. Marland Kitchen Disinfect your kitchen and cutting boards using 1 teaspoon of liquid, unscented bleach  mixed into 1 quart of water.   Dispose of old food. . Eat canned and packaged food before its expiration date (the "use by" or "best before" date). . Consume refrigerated leftovers within 3 to 4 days. After that time, throw out the food. Even if the food does not smell or look spoiled, it still may be unsafe. Some bacteria, such as Listeria, can grow even on foods stored in the refrigerator if they are kept for too long. Take precautions when eating out. . At restaurants, avoid buffets and salad bars where food sits out for a long time and comes in contact with many people. Food can become contaminated when someone with a virus, often a norovirus, or another "bug" handles it. . Put any leftover food in a "to-go" container yourself, rather than having the server do it. And, refrigerate leftovers as soon as you get home. . Choose restaurants that are clean and that are willing to prepare your food as you order it cooked.    MEDICATIONS:                                                                                                               Zofran/Ondansetron 8mg  tablet. Take 1 tablet every 8 hours as needed for nausea/vomiting. (#1 nausea med to take, this can constipate)  Compazine/Prochlorperazine 10mg  tablet. Take 1 tablet every 6 hours as needed for nausea/vomiting. (#2 nausea med to take, this can make you sleepy)   Over-the-Counter Meds:  Miralax 1 capful in 8 oz of fluid daily. May increase to two times a day if needed. This is a stool softener. If this doesn't work proceed you can add:  Senokot S-start with 1 tablet two times a day and increase to 4 tablets two times a day if needed. (total of 8 tablets in a 24 hour period). This is a stimulant laxative.   Call us if this does not help your bowels move.   Imodium 2mg  capsule. Take 2 capsules after the 1st loose stool and then 1 capsule every 2 hours until you go a total of 12 hours without having a loose stool. Call the [facility] if loose stools continue. If diarrhea occurs @ bedtime, take 2 capsules @ bedtime. Then take 2 capsules every 4 hours until morning. Call [facility].     Constipation Sheet *Miralax in 8 oz of fluid daily.  May increase to two times a day if needed.  This is a stool softener.  If this not enough to keep your bowel regular:  You can add:  *Senokot S, start with one  tablet twice a day and can increase to 4 tablets twice a day if needed.  This is a stimulant laxative.   Sometimes when you take pain medication you need BOTH a medicine to keep your stool soft and a medicine to help your bowel push it out!  Please call if the above does not work for you.   Do not go more than 2 days without a bowel movement.  It is very important that you do not become constipated.  It will make you feel sick to your stomach (nausea) and can cause abdominal pain and vomiting.     Diarrhea Sheet  If you are having loose stools/diarrhea, please purchase Imodium and begin taking as outlined:  At the first sign of poorly formed or loose stools you should begin taking Imodium(loperamide) 2 mg capsules.  Take two caplets (4mg ) followed by one caplet (2mg ) every 2 hours until you have had iarrhea for 12 hours.    Always call the [facility] if you are having loose stools/diarrhea that you can't get under control.  Loose stools/disrrhea leads to dehydration (loss of water) in your body.  We have other options of trying to get the loose stools/diarrhea to stopped but you must let us know!     Nausea Sheet  Zofran/Ondansetron 8mg  tablet. Take 1 tablet every 8 hours as needed for nausea/vomiting. (#1 nausea med to take, this can constipate)  Compazine/Prochlorperazine 10mg  tablet. Take 1 tablet every 6 hours as needed for nausea/vomiting. (#2 nausea med to take, this can make you sleepy)  You can take these medications together or separately.  We would first like for you to try the Ondansetron by itself  and then take the Prochloperizine if needed. But you are allowed to take both medications at the same time if your nausea is that severe.  If you are having persistent nausea (nausea that does not stop) please take these medications on a staggered schedule so that the nausea medication stays in your body.  Please call the [facility] and let us know the amount of nausea that you are experiencing.  If you begin to vomit, you need to call the [facility] and if it is the weekend and you have vomited more than one time and cant get it to stop-go to the Emergency Room.  Persistent nausea/vomiting can lead to dehydration (loss of fluid in your body) and will make you feel terrible.   Ice chips, sips of clear liquids, foods that are @ room temperature, crackers, and toast tend to be better tolerated.    SYMPTOMS TO REPORT AS SOON AS POSSIBLE AFTER TREATMENT:  FEVER GREATER THAN 100.5 F  CHILLS WITH OR WITHOUT FEVER  NAUSEA AND VOMITING THAT IS NOT CONTROLLED WITH YOUR NAUSEA MEDICATION  UNUSUAL SHORTNESS OF BREATH  UNUSUAL BRUISING OR BLEEDING  TENDERNESS IN MOUTH AND THROAT WITH OR WITHOUT PRESENCE OF ULCERS  URINARY PROBLEMS  BOWEL PROBLEMS  UNUSUAL RASH    Wear comfortable clothing and clothing appropriate for easy access to any Portacath or PICC line. Let us know if there is anything that we can do to make your therapy better!    What to do if you need assistance after hours or on the weekends: CALL [phone].  HOLD on the line, do not hang up.  You will hear multiple messages but at the end you will be connected with a nurse triage line.  They will contact Dr Whitney Muse if necessary.  Most of the time  they will be able to assist you.   Do not call the hospital operator.  Dr Whitney Muse will not answer phone calls received by them.     I have been informed and understand all of the instructions given to me and have received a copy. I have been instructed to call the clinic [phone] or my family physician as soon as possible for continued medical care, if indicated. I do not have any more questions at this time but understand that I may call the [facility] or the Patient Navigator at [phone] during office hours should I have questions or need assistance in obtaining follow-up care.

## 2016-09-19 ENCOUNTER — Encounter (HOSPITAL_COMMUNITY): Payer: Medicare Other

## 2016-09-19 DIAGNOSIS — C719 Malignant neoplasm of brain, unspecified: Secondary | ICD-10-CM

## 2016-09-19 NOTE — Progress Notes (Signed)
Consent signed for avastin

## 2016-09-20 ENCOUNTER — Encounter (HOSPITAL_COMMUNITY): Payer: Self-pay

## 2016-09-20 ENCOUNTER — Encounter (HOSPITAL_BASED_OUTPATIENT_CLINIC_OR_DEPARTMENT_OTHER): Payer: Medicare Other

## 2016-09-20 ENCOUNTER — Encounter (HOSPITAL_COMMUNITY): Payer: Medicare Other

## 2016-09-20 VITALS — BP 122/65 | HR 98 | Temp 97.8°F | Resp 16 | Wt 158.2 lb

## 2016-09-20 DIAGNOSIS — D696 Thrombocytopenia, unspecified: Secondary | ICD-10-CM | POA: Diagnosis not present

## 2016-09-20 DIAGNOSIS — I10 Essential (primary) hypertension: Secondary | ICD-10-CM | POA: Diagnosis not present

## 2016-09-20 DIAGNOSIS — E2740 Unspecified adrenocortical insufficiency: Secondary | ICD-10-CM | POA: Diagnosis not present

## 2016-09-20 DIAGNOSIS — E099 Drug or chemical induced diabetes mellitus without complications: Secondary | ICD-10-CM | POA: Diagnosis not present

## 2016-09-20 DIAGNOSIS — R634 Abnormal weight loss: Secondary | ICD-10-CM | POA: Diagnosis not present

## 2016-09-20 DIAGNOSIS — Z794 Long term (current) use of insulin: Secondary | ICD-10-CM | POA: Diagnosis not present

## 2016-09-20 DIAGNOSIS — Z79899 Other long term (current) drug therapy: Secondary | ICD-10-CM | POA: Diagnosis not present

## 2016-09-20 DIAGNOSIS — E785 Hyperlipidemia, unspecified: Secondary | ICD-10-CM | POA: Diagnosis not present

## 2016-09-20 DIAGNOSIS — C719 Malignant neoplasm of brain, unspecified: Secondary | ICD-10-CM | POA: Diagnosis present

## 2016-09-20 DIAGNOSIS — T380X5A Adverse effect of glucocorticoids and synthetic analogues, initial encounter: Secondary | ICD-10-CM | POA: Diagnosis not present

## 2016-09-20 DIAGNOSIS — Z9221 Personal history of antineoplastic chemotherapy: Secondary | ICD-10-CM | POA: Diagnosis not present

## 2016-09-20 DIAGNOSIS — Z5112 Encounter for antineoplastic immunotherapy: Secondary | ICD-10-CM | POA: Diagnosis present

## 2016-09-20 DIAGNOSIS — Z923 Personal history of irradiation: Secondary | ICD-10-CM | POA: Diagnosis not present

## 2016-09-20 DIAGNOSIS — Z87891 Personal history of nicotine dependence: Secondary | ICD-10-CM | POA: Diagnosis not present

## 2016-09-20 LAB — CBC WITH DIFFERENTIAL/PLATELET
Basophils Absolute: 0 10*3/uL (ref 0.0–0.1)
Basophils Relative: 1 %
EOS ABS: 0.1 10*3/uL (ref 0.0–0.7)
Eosinophils Relative: 2 %
HCT: 33.6 % — ABNORMAL LOW (ref 39.0–52.0)
HEMOGLOBIN: 11.5 g/dL — AB (ref 13.0–17.0)
LYMPHS ABS: 0.7 10*3/uL (ref 0.7–4.0)
Lymphocytes Relative: 20 %
MCH: 33.6 pg (ref 26.0–34.0)
MCHC: 34.2 g/dL (ref 30.0–36.0)
MCV: 98.2 fL (ref 78.0–100.0)
Monocytes Absolute: 0.4 10*3/uL (ref 0.1–1.0)
Monocytes Relative: 13 %
NEUTROS ABS: 2.2 10*3/uL (ref 1.7–7.7)
NEUTROS PCT: 66 %
Platelets: 117 10*3/uL — ABNORMAL LOW (ref 150–400)
RBC: 3.42 MIL/uL — AB (ref 4.22–5.81)
RDW: 13.6 % (ref 11.5–15.5)
WBC: 3.4 10*3/uL — AB (ref 4.0–10.5)

## 2016-09-20 LAB — COMPREHENSIVE METABOLIC PANEL
ALBUMIN: 3.9 g/dL (ref 3.5–5.0)
ALK PHOS: 28 U/L — AB (ref 38–126)
ALT: 15 U/L — AB (ref 17–63)
AST: 21 U/L (ref 15–41)
Anion gap: 5 (ref 5–15)
BUN: 11 mg/dL (ref 6–20)
CALCIUM: 9.9 mg/dL (ref 8.9–10.3)
CO2: 27 mmol/L (ref 22–32)
CREATININE: 1.03 mg/dL (ref 0.61–1.24)
Chloride: 108 mmol/L (ref 101–111)
GFR calc non Af Amer: >60 mL/min (ref >60)
GLUCOSE: 131 mg/dL — AB (ref 65–99)
Potassium: 4.1 mmol/L (ref 3.5–5.1)
SODIUM: 140 mmol/L (ref 135–145)
Total Bilirubin: 0.8 mg/dL (ref 0.3–1.2)
Total Protein: 6.3 g/dL — ABNORMAL LOW (ref 6.5–8.1)

## 2016-09-20 LAB — URINALYSIS, DIPSTICK ONLY
BILIRUBIN URINE: NEGATIVE
GLUCOSE, UA: NEGATIVE mg/dL
HGB URINE DIPSTICK: NEGATIVE
KETONES UR: NEGATIVE mg/dL
Leukocytes, UA: NEGATIVE
Nitrite: NEGATIVE
PH: 5 (ref 5.0–8.0)
Protein, ur: NEGATIVE mg/dL
Specific Gravity, Urine: 1.017 (ref 1.005–1.030)

## 2016-09-20 MED ORDER — SODIUM CHLORIDE 0.9 % IV SOLN
Freq: Once | INTRAVENOUS | Status: AC
  Administered 2016-09-20: 12:00:00 via INTRAVENOUS

## 2016-09-20 MED ORDER — BEVACIZUMAB CHEMO INJECTION 400 MG/16ML
9.7500 mg/kg | Freq: Once | INTRAVENOUS | Status: DC
  Filled 2016-09-20: qty 28

## 2016-09-20 MED ORDER — SODIUM CHLORIDE 0.9 % IV SOLN
9.7500 mg/kg | Freq: Once | INTRAVENOUS | Status: AC
  Administered 2016-09-20: 700 mg via INTRAVENOUS
  Filled 2016-09-20: qty 28

## 2016-09-20 NOTE — Patient Instructions (Signed)
[facility] Discharge Instructions for Patients Receiving Chemotherapy   Beginning January 23rd 2017 lab work for the Cancer Center will be done in the  Main lab at [facility] on 1st floor. If you have a lab appointment with the Cancer Center please come in thru the  Main Entrance and check in at the main information desk   Today you received the following chemotherapy agents Avastin  To help prevent nausea and vomiting after your treatment, we encourage you to take your nausea medication   If you develop nausea and vomiting, or diarrhea that is not controlled by your medication, call the clinic.  The clinic phone number is [phone]. Office hours are Monday-Friday 8:30am-5:00pm.  BELOW ARE SYMPTOMS THAT SHOULD BE REPORTED IMMEDIATELY:  *FEVER GREATER THAN 101.0 F  *CHILLS WITH OR WITHOUT FEVER  NAUSEA AND VOMITING THAT IS NOT CONTROLLED WITH YOUR NAUSEA MEDICATION  *UNUSUAL SHORTNESS OF BREATH  *UNUSUAL BRUISING OR BLEEDING  TENDERNESS IN MOUTH AND THROAT WITH OR WITHOUT PRESENCE OF ULCERS  *URINARY PROBLEMS  *BOWEL PROBLEMS  UNUSUAL RASH Items with * indicate a potential emergency and should be followed up as soon as possible. If you have an emergency after office hours please contact your primary care physician or go to the nearest emergency department.  Please call the clinic during office hours if you have any questions or concerns.   You may also contact the Patient Navigator at [phone] should you have any questions or need assistance in obtaining follow up care.      Resources For Cancer Patients and their Caregivers ? American Cancer Society: Can assist with transportation, wigs, general needs, runs Look Good Feel Better.        1-888-227-6333 ? Cancer Care: Provides financial assistance, online support groups, medication/co-pay assistance.  1-800-813-HOPE (4673) ? [facility] Assists [place] cancer  patients and their families through emotional , educational and financial support.  336-427-4357 ? [place] DSS Where to apply for food stamps, Medicaid and utility assistance. 336-342-1394 ? RCATS: Transportation to medical appointments. 336-347-2287 ? Social Security Administration: May apply for disability if have a Stage IV cancer. 336-342-7796 1-800-772-1213 ? [place] Aging, Disability and Transit Services: Assists with nutrition, care and transit needs. [phone]          

## 2016-09-20 NOTE — Progress Notes (Signed)
Labs reviewed with MD, proceed with treatment.  1st Avastin administered today. Patient tolerated it well, no problems. Vitals stable and discharged home via wheelchair with wife.

## 2016-09-21 NOTE — Progress Notes (Signed)
24hour follow up. Patient's wife states he is not drinking like he should, otherwise doing ok. Patient's wife is frustrated and give out trying to help him. Encouraged her to call us Monday and let us know how he is doing.

## 2016-09-28 ENCOUNTER — Encounter (HOSPITAL_COMMUNITY): Payer: Self-pay | Admitting: Oncology

## 2016-09-28 ENCOUNTER — Encounter: Payer: Self-pay | Admitting: Dietician

## 2016-09-28 ENCOUNTER — Encounter (HOSPITAL_BASED_OUTPATIENT_CLINIC_OR_DEPARTMENT_OTHER): Payer: Medicare Other | Admitting: Oncology

## 2016-09-28 ENCOUNTER — Other Ambulatory Visit (HOSPITAL_COMMUNITY): Payer: Self-pay | Admitting: Oncology

## 2016-09-28 DIAGNOSIS — E876 Hypokalemia: Secondary | ICD-10-CM | POA: Diagnosis not present

## 2016-09-28 DIAGNOSIS — C719 Malignant neoplasm of brain, unspecified: Secondary | ICD-10-CM

## 2016-09-28 MED ORDER — POTASSIUM CHLORIDE CRYS ER 20 MEQ PO TBCR: 40.0000 meq | EXTENDED_RELEASE_TABLET | Freq: Every day | ORAL | 1 refills | Status: DC

## 2016-09-28 NOTE — Progress Notes (Signed)
[name], MD [place] [id number]  Glioblastoma multiforme of brain [facility])  Low blood potassium - Plan: potassium chloride SA (K-DUR,KLOR-CON) 20 MEQ tablet  CURRENT THERAPY: Avastin 10 mg/kg every 2 weeks beginning on 09/20/2016.  INTERVAL HISTORY: Izreal Pu Koren 75 y.o. 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 Owens Shark at [facility].  S/P Temodar with concurrent XRT and began adjuvant Temodar on [id number], complicated by Keppra-induced thrombocytopenia (resolved with discontinuation of Keppra).  Temodar discontinued on 08/27/2016 due to ongoing and significant weight loss and progression of disease on MRI imaging.  Avastin started on 09/20/2016.    Glioblastoma multiforme of brain ([place])   07/19/2016 Treatment Plan Change    Dose of Temodar reduced to 150 mg/m2      07/19/2016 Adverse Reaction    Weight loss, poor tolerance, thrombocytopenia secondary to Temodar 200 mg/m2      07/25/2016 Treatment Plan Change    Treatment held x 1 week      07/25/2016 Imaging    [place] LE B/L- No evidence of DVT within either lower extremity.      08/01/2016 Echocardiogram    Left ventricle: The cavity size was normal. Wall thickness was normal. Systolic function was normal. The estimated ejection fraction was in the range of 50% to 55%. Wall motion was normal; there were no regional wall motion abnormalities. Doppler parameters are consistent with abnormal left ventricular relaxation (grade 1 diastolic dysfunction). Doppler parameters are consistent with high ventricular filling pressure. - Left atrium: The atrium was mildly dilated. - Tricuspid valve: There was mild regurgitation.      08/04/2016 - 08/27/2016 Chemotherapy    Temodar 150 mg/m2 (beginning on 10/21)       08/29/2016 Imaging    MRI brain- 1. New  marginal enhancement and increased signal abnormality around the right parietal glioblastoma resection cavity compatible with progression. Cannot exclude radiation changes. 2. There is new 2 mm focus of white matter enhancement in the parasagittal right frontal lobe. This is primarily concerning for distant site of disease progression, need correlation with radiation dose distribution.      09/20/2016 -  Chemotherapy    Avastin 10 mg/kg every 2 weeks       He is here today for a tolerance check having received his fist dose of Avastin last week.  He tolerated treatment well without any known complaints.  He reports difficulty with hydration status and therefore, we discussed options to maintain good hydration throughout treatment.    His appetite comes and goes, but overall improved off of Temodar.  His weight is up 4 lbs.  Review of Systems  Constitutional: Negative for chills, fever and weight loss.  HENT: Negative for tinnitus.   Eyes: Negative.  Negative for blurred vision and double vision.  Respiratory: Negative.  Negative for cough.   Cardiovascular: Negative.  Negative for chest pain.  Gastrointestinal: Negative.  Negative for constipation, diarrhea, nausea and vomiting.  Genitourinary: Negative.   Musculoskeletal: Negative.   Neurological: Negative.  Negative for headaches.  Endo/Heme/Allergies: Negative.   Psychiatric/Behavioral: Positive for memory loss.    Past Medical History:  Diagnosis Date  . Brain cancer ([place])   . Diabetes mellitus without complication ([place])   . Glioblastoma multiforme of brain ([place]) 10/27/2015    Past Surgical History:  Procedure Laterality Date  .  CHOLECYSTECTOMY      Family History  Problem Relation Age of Onset  . Cancer Mother   . Diabetes Mother   . Cancer Father   . Cancer Brother     Social History   Social History  . Marital status: Married    Spouse name: N/A  . Number of children: N/A  . Years of education: N/A     Social History Main Topics  . Smoking status: Former Research scientist (life sciences)  . Smokeless tobacco: Never Used  . Alcohol use No  . Drug use: No  . Sexual activity: No   Other Topics Concern  . None   Social History Narrative  . None     PHYSICAL EXAMINATION  ECOG PERFORMANCE STATUS: 2 - Symptomatic, <50% confined to bed  Vitals:   09/28/16 1100  BP: (!) 144/80  Pulse: 83  Resp: 16  Temp: 97.5 F (36.4 C)    GENERAL:alert, no distress, comfortable, cooperative, smiling and accompanied by his wife and older brother, in wheelchair. SKIN: skin color, texture, turgor are normal, no rashes or significant lesions HEAD: Normocephalic, No masses, lesions, tenderness or abnormalities EYES: normal, EOMI, Conjunctiva are pink and non-injected EARS: External ears normal OROPHARYNX:lips, buccal mucosa, and tongue normal and mucous membranes are moist  NECK: supple, trachea midline LYMPH:  no palpable lymphadenopathy BREAST:not examined LUNGS: clear to auscultation  HEART: regular rate & rhythm ABDOMEN:abdomen soft and normal bowel sounds BACK: Back symmetric, no curvature. EXTREMITIES:less then 2 second capillary refill, no joint deformities, effusion, or inflammation, no skin discoloration, no cyanosis  NEURO: alert & oriented x 3 with fluent speech, no focal motor/sensory deficits, in wheelchair  LABORATORY DATA: CBC    Component Value Date/Time   WBC 3.4 (L) 09/20/2016 0920   RBC 3.42 (L) 09/20/2016 0920   HGB 11.5 (L) 09/20/2016 0920   HCT 33.6 (L) 09/20/2016 0920   PLT 117 (L) 09/20/2016 0920   MCV 98.2 09/20/2016 0920   MCH 33.6 09/20/2016 0920   MCHC 34.2 09/20/2016 0920   RDW 13.6 09/20/2016 0920   LYMPHSABS 0.7 09/20/2016 0920   MONOABS 0.4 09/20/2016 0920   EOSABS 0.1 09/20/2016 0920   BASOSABS 0.0 09/20/2016 0920      Chemistry      Component Value Date/Time   NA 140 09/20/2016 0920   K 4.1 09/20/2016 0920   CL 108 09/20/2016 0920   CO2 27 09/20/2016 0920   BUN 11  09/20/2016 0920   CREATININE 1.03 09/20/2016 0920      Component Value Date/Time   CALCIUM 9.9 09/20/2016 0920   ALKPHOS 28 (L) 09/20/2016 0920   AST 21 09/20/2016 0920   ALT 15 (L) 09/20/2016 0920   BILITOT 0.8 09/20/2016 0920        PENDING LABS:   RADIOGRAPHIC STUDIES:  No results found.   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 Owens Shark with removal of 80% of tumor followed by concomitant Temodar + XRT.  He started his first cycle of adjuvant temozolomide on 02/21/2016.  Future cycles werre complicated by Keppra-induced thrombocytopenia (without documented seizure activity) and therefore, Keppra was discontinued on 04/27/2016 with significant improvement in platelet count and dose escalation of Temodar to 200 mg/m2.  However, performance status and blood counts declined requiring a change back to 150 mg/m2 of Temodar on 08/02/2016.  Temodar discontinued on 08/27/2016  due to ongoing and significant weight loss and progression of disease on MRI imaging.  Avastin started on 09/20/2016.  Oncology history is updated.  Pre-treatment labs as ordered with his next cycle of Avastin next week.  Good hydration education is provided.    Weight is up 4 lbs.  Burtis Junes, RD saw the patient today to provide ongoing nutritional support.  Tolerated his first Avastin treatment well without any complaints.    Return next week for cycle #2 of Avastin.  Return for follow-up as scheduled.   ORDERS PLACED FOR THIS ENCOUNTER: No orders of the defined types were placed in this encounter.   MEDICATIONS PRESCRIBED THIS ENCOUNTER: Meds ordered this encounter  Medications  . docusate sodium (COLACE) 100 MG capsule    Sig: TAKE 100 MG BY MOUTH TWO TIMES DAILY    Refill:  3  . potassium chloride SA (K-DUR,KLOR-CON) 20 MEQ tablet     Sig: Take 2 tablets (40 mEq total) by mouth daily.    Dispense:  60 tablet    Refill:  1    Order Specific Question:   Supervising Provider    Answer:   Patrici Ranks [id number]    THERAPY PLAN:  Continue Avastin every 2 weeks  All questions were answered. The patient knows to call the clinic with any problems, questions or concerns. We can certainly see the patient much sooner if necessary.  Patient and plan discussed with Dr. Ancil Linsey and she is in agreement with the aforementioned.   This note is electronically signed by: Doy Mince 09/28/2016 3:49 PM

## 2016-09-28 NOTE — Patient Instructions (Addendum)
[place] at [facility] Discharge Instructions  RECOMMENDATIONS MADE BY THE CONSULTANT AND ANY TEST RESULTS WILL BE SENT TO YOUR REFERRING PHYSICIAN.  You were seen by Gershon Mussel today. Treatment next week with labs Return in 3 weeks for follow and treatment    Thank you for choosing [place] at [facility] to provide your oncology and hematology care.  To afford each patient quality time with our provider, please arrive at least 15 minutes before your scheduled appointment time.   Beginning January 23rd 2017 lab work for the [facility] will be done in the  Main lab at [facility] on 1st floor. If you have a lab appointment with the [facility] please come in thru the  Main Entrance and check in at the main information desk  You need to re-schedule your appointment should you arrive 10 or more minutes late.  We strive to give you quality time with our providers, and arriving late affects you and other patients whose appointments are after yours.  Also, if you no show three or more times for appointments you may be dismissed from the clinic at the providers discretion.     Again, thank you for choosing [facility].  Our hope is that these requests will decrease the amount of time that you wait before being seen by our physicians.       _____________________________________________________________  Should you have questions after your visit to [facility], please contact our office at [phone] between the hours of 8:30 a.m. and 4:30 p.m.  Voicemails left after 4:30 p.m. will not be returned until the following business day.  For prescription refill requests, have your pharmacy contact our office.         Resources For Cancer Patients and their Caregivers ? American Cancer Society: Can assist with transportation, wigs, general needs, runs Look Good Feel Better.        937-417-9493 ? Cancer Care: Provides  financial assistance, online support groups, medication/co-pay assistance.  1-800-813-HOPE (680) 536-9062) ? [facility] Assists [place] cancer patients and their families through emotional , educational and financial support.  564-524-3047 ? [place] DSS Where to apply for food stamps, Medicaid and utility assistance. 208-649-8387 ? RCATS: Transportation to medical appointments. 316-384-4851 ? Social Security Administration: May apply for disability if have a Stage IV cancer. 419-638-8100 ([phone] ? [employer], Disability and Transit Services: Assists with nutrition, care and transit needs. [place] Support Programs: @10RELATIVEDAYS @ > Cancer Support Group  2nd Tuesday of the month 1pm-2pm, Journey Room  > Creative Journey  3rd Tuesday of the month 1130am-1pm, Journey Room  > Look Good Feel Better  1st Wednesday of the month 10am-12 noon, Journey Room (Call [place] to register [phone])

## 2016-09-28 NOTE — Progress Notes (Signed)
Follow up High Risk Cancer Pt. Has Glioblastoma multiforme  Contacted Pt by visiting him before his appointment.   Wt Readings from Last 10 Encounters:  09/28/16 161 lb 9.6 oz (73.3 kg)  09/20/16 158 lb 3.2 oz (71.8 kg)  09/14/16 160 lb (72.6 kg)  08/31/16 161 lb (73 kg)  08/27/16 161 lb 11.2 oz (73.3 kg)  08/10/16 167 lb 14.4 oz (76.2 kg)  07/19/16 170 lb 3.2 oz (77.2 kg)  07/02/16 169 lb 9.6 oz (76.9 kg)  06/21/16 170 lb 1.6 oz (77.2 kg)  06/14/16 173 lb 1.6 oz (78.5 kg)   Patient weight has increased by 3.5 lbs x 1 week. Wt has been stable x 1 month.   Patient reports oral intake as relatively unchanged. His PO intake is still highly variable. Some days he eats well and some days he eats extremely poor. On his best day, he will eat 3 small meals.   He has had large increase in fluid intake, much to patient's chagrin. His wife has been really pushing the water on him. He is drinking 3 bottles of water and says he feels overloaded with this much. The goal is for 5/day. Patient believes his weight gain is from hydration.   Fortunately, he reports that his taste changes are somewhat improved, making some foods more palatable. His wife has been preparing him chicken w/o skin, dipped in cream of mushroom. Told her this was an excellent meal. She also has been preparing him cookies.  His nausea also comes and goes. He has been making sure to pre-medicate before meals so he doesn't get nauseated with PO intake.  He gets nauseated when he eats greasy foods.   Given that his taste changes are improved, he may benefit from an appetite stimulant. However, patient was not very interested once he heard the medication was very poor tasting. He appears to have stabilized now, so this will not be utilized at this time.   Pt appears healthful and joyous. Has very supportive, invaluable family that continue to greatly aide him in his nutrition.  Will continue to monitor.   Burtis Junes RD, LDN,  [facility] Pager: [phone] 09/28/2016 12:28 PM

## 2016-09-28 NOTE — Assessment & Plan Note (Addendum)
Right parietal lobe Glioblastoma Multiforme, grade 4, unknown methylation status of MGMT, S/P partial resection on 10/25/2015 by Dr. Owens Shark with removal of 80% of tumor followed by concomitant Temodar + XRT.  He started his first cycle of adjuvant temozolomide on 02/21/2016.  Future cycles werre complicated by Keppra-induced thrombocytopenia (without documented seizure activity) and therefore, Keppra was discontinued on 04/27/2016 with significant improvement in platelet count and dose escalation of Temodar to 200 mg/m2.  However, performance status and blood counts declined requiring a change back to 150 mg/m2 of Temodar on 08/02/2016.  Temodar discontinued on 08/27/2016 due to ongoing and significant weight loss and progression of disease on MRI imaging.  Avastin started on 09/20/2016.  Oncology history is updated.  Pre-treatment labs as ordered with his next cycle of Avastin next week.  Good hydration education is provided.    Weight is up 4 lbs.  Burtis Junes, RD saw the patient today to provide ongoing nutritional support.  Tolerated his first Avastin treatment well without any complaints.    Return next week for cycle #2 of Avastin.  Return for follow-up as scheduled.

## 2016-10-01 ENCOUNTER — Other Ambulatory Visit (HOSPITAL_COMMUNITY): Payer: Self-pay | Admitting: Oncology

## 2016-10-01 DIAGNOSIS — E876 Hypokalemia: Secondary | ICD-10-CM

## 2016-10-04 ENCOUNTER — Encounter: Payer: Self-pay | Admitting: Dietician

## 2016-10-04 ENCOUNTER — Encounter (HOSPITAL_COMMUNITY): Payer: Medicare Other

## 2016-10-04 ENCOUNTER — Encounter (HOSPITAL_BASED_OUTPATIENT_CLINIC_OR_DEPARTMENT_OTHER): Payer: Medicare Other

## 2016-10-04 ENCOUNTER — Encounter (HOSPITAL_COMMUNITY): Payer: Self-pay

## 2016-10-04 VITALS — BP 126/66 | HR 78 | Temp 98.1°F | Resp 18 | Wt 160.0 lb

## 2016-10-04 DIAGNOSIS — C719 Malignant neoplasm of brain, unspecified: Secondary | ICD-10-CM

## 2016-10-04 DIAGNOSIS — Z5112 Encounter for antineoplastic immunotherapy: Secondary | ICD-10-CM | POA: Diagnosis present

## 2016-10-04 LAB — CBC WITH DIFFERENTIAL/PLATELET
BASOS PCT: 0 %
Basophils Absolute: 0 10*3/uL (ref 0.0–0.1)
EOS ABS: 0.1 10*3/uL (ref 0.0–0.7)
Eosinophils Relative: 2 %
HEMATOCRIT: 34.3 % — AB (ref 39.0–52.0)
Hemoglobin: 12 g/dL — ABNORMAL LOW (ref 13.0–17.0)
LYMPHS ABS: 0.9 10*3/uL (ref 0.7–4.0)
Lymphocytes Relative: 23 %
MCH: 34.2 pg — AB (ref 26.0–34.0)
MCHC: 35 g/dL (ref 30.0–36.0)
MCV: 97.7 fL (ref 78.0–100.0)
MONO ABS: 0.4 10*3/uL (ref 0.1–1.0)
MONOS PCT: 11 %
Neutro Abs: 2.4 10*3/uL (ref 1.7–7.7)
Neutrophils Relative %: 64 %
Platelets: 125 10*3/uL — ABNORMAL LOW (ref 150–400)
RBC: 3.51 MIL/uL — ABNORMAL LOW (ref 4.22–5.81)
RDW: 13.5 % (ref 11.5–15.5)
WBC: 3.8 10*3/uL — ABNORMAL LOW (ref 4.0–10.5)

## 2016-10-04 LAB — URINALYSIS, DIPSTICK ONLY
BILIRUBIN URINE: NEGATIVE
GLUCOSE, UA: NEGATIVE mg/dL
HGB URINE DIPSTICK: NEGATIVE
KETONES UR: NEGATIVE mg/dL
Leukocytes, UA: NEGATIVE
Nitrite: NEGATIVE
PH: 5 (ref 5.0–8.0)
Protein, ur: NEGATIVE mg/dL
Specific Gravity, Urine: 1.015 (ref 1.005–1.030)

## 2016-10-04 LAB — COMPREHENSIVE METABOLIC PANEL
ALBUMIN: 3.9 g/dL (ref 3.5–5.0)
ALK PHOS: 32 U/L — AB (ref 38–126)
ALT: 13 U/L — AB (ref 17–63)
AST: 24 U/L (ref 15–41)
Anion gap: 5 (ref 5–15)
BUN: 6 mg/dL (ref 6–20)
CO2: 27 mmol/L (ref 22–32)
CREATININE: 0.94 mg/dL (ref 0.61–1.24)
Calcium: 9.4 mg/dL (ref 8.9–10.3)
Chloride: 106 mmol/L (ref 101–111)
GFR calc non Af Amer: >60 mL/min (ref >60)
GLUCOSE: 105 mg/dL — AB (ref 65–99)
Potassium: 4 mmol/L (ref 3.5–5.1)
Sodium: 138 mmol/L (ref 135–145)
Total Bilirubin: 0.8 mg/dL (ref 0.3–1.2)
Total Protein: 6.3 g/dL — ABNORMAL LOW (ref 6.5–8.1)

## 2016-10-04 MED ORDER — SODIUM CHLORIDE 0.9 % IV SOLN
9.7500 mg/kg | Freq: Once | INTRAVENOUS | Status: AC
  Administered 2016-10-04: 700 mg via INTRAVENOUS
  Filled 2016-10-04: qty 12

## 2016-10-04 MED ORDER — SODIUM CHLORIDE 0.9 % IV SOLN
Freq: Once | INTRAVENOUS | Status: AC
  Administered 2016-10-04: 12:00:00 via INTRAVENOUS

## 2016-10-04 NOTE — Patient Instructions (Signed)
[place]  [facility] Discharge Instructions for Patients Receiving Chemotherapy   Beginning January 23rd 2017 lab work for the Cancer Center will be done in the  Main lab at [facility] on 1st floor. If you have a lab appointment with the Cancer Center please come in thru the  Main Entrance and check in at the main information desk   Today you received the following chemotherapy agents Avastin  To help prevent nausea and vomiting after your treatment, we encourage you to take your nausea medication   If you develop nausea and vomiting, or diarrhea that is not controlled by your medication, call the clinic.  The clinic phone number is [phone]. Office hours are Monday-Friday 8:30am-5:00pm.  BELOW ARE SYMPTOMS THAT SHOULD BE REPORTED IMMEDIATELY:  *FEVER GREATER THAN 101.0 F  *CHILLS WITH OR WITHOUT FEVER  NAUSEA AND VOMITING THAT IS NOT CONTROLLED WITH YOUR NAUSEA MEDICATION  *UNUSUAL SHORTNESS OF BREATH  *UNUSUAL BRUISING OR BLEEDING  TENDERNESS IN MOUTH AND THROAT WITH OR WITHOUT PRESENCE OF ULCERS  *URINARY PROBLEMS  *BOWEL PROBLEMS  UNUSUAL RASH Items with * indicate a potential emergency and should be followed up as soon as possible. If you have an emergency after office hours please contact your primary care physician or go to the nearest emergency department.  Please call the clinic during office hours if you have any questions or concerns.   You may also contact the Patient Navigator at [phone] should you have any questions or need assistance in obtaining follow up care.      Resources For Cancer Patients and their Caregivers ? American Cancer Society: Can assist with transportation, wigs, general needs, runs Look Good Feel Better.        1-888-227-6333 ? Cancer Care: Provides financial assistance, online support groups, medication/co-pay assistance.  1-800-813-HOPE (4673) ? [facility] Assists [place] cancer  patients and their families through emotional , educational and financial support.  336-427-4357 ? [place] DSS Where to apply for food stamps, Medicaid and utility assistance. 336-342-1394 ? RCATS: Transportation to medical appointments. 336-347-2287 ? Social Security Administration: May apply for disability if have a Stage IV cancer. 336-342-7796 1-800-772-1213 ? [place] Aging, Disability and Transit Services: Assists with nutrition, care and transit needs. [phone]          

## 2016-10-04 NOTE — Progress Notes (Signed)
Chemotherapy given today per orders. Patient tolerated it well no problems. Vitals stable and discharged from clinic via wheelchair with wife. Follow up as scheduled.

## 2016-10-04 NOTE — Progress Notes (Signed)
Follow up High Risk Cancer Pt. Has Glioblastoma multiforme  Contacted Pt by visiting him during chemo infusion  Wt Readings from Last 10 Encounters:  10/04/16 160 lb (72.6 kg)  09/28/16 161 lb 9.6 oz (73.3 kg)  09/20/16 158 lb 3.2 oz (71.8 kg)  09/14/16 160 lb (72.6 kg)  08/31/16 161 lb (73 kg)  08/27/16 161 lb 11.2 oz (73.3 kg)  08/10/16 167 lb 14.4 oz (76.2 kg)  07/19/16 170 lb 3.2 oz (77.2 kg)  07/02/16 169 lb 9.6 oz (76.9 kg)  06/21/16 170 lb 1.6 oz (77.2 kg)   Patient weight has been stable x 1 month.   Wife reports an unchanged eating pattern. Pt "will eat good for 4 days and then eat poor for 2 days". Pt confirms that his taste changes are still the main reason for his poor PO intake. Additionally, nausea is still present.   Wife recently found a blueberry flavored supplement, similar to Ensure clear, that the patient has been drinking.   Wife is still pushing for a large amount of water to be consumed in order to "flush his kidneys of the old chemotherapy". Wife thinks the patient's nausea may improve when this new chemotherapy "gets into his system".    RD asked about multiple high calorie foods such as milkshakes, star bucks coffee drinks, eggnog, buttermilk, whole milk etc. Patient reports he has no desire to have any of these.   Pt asked if he drank one glass of buttermilk if he could not drink the bottles of water. Wife quickly said "no!". Wife is extremely fixated on WATER intake. She believes if pt doesn't drink enough water the pt may need enough dialysis (Renal labs are currently WDL). RD tried to explain that the importance is on hydration. This does not have to be from water. Water provides him with no calories or protein and fills him up. It would be far better for him to drink a calorie beverage  Will ask PA to reinforce that pt does not have to drink only water.   They have not thought about the appetite stimulant further.  Pt seems to be at baseline  RD will  continue to follow.   Burtis Junes RD, LDN, [facility] Pager: [phone] 10/04/2016 1:00 PM

## 2016-10-18 ENCOUNTER — Encounter (HOSPITAL_BASED_OUTPATIENT_CLINIC_OR_DEPARTMENT_OTHER): Payer: Medicare Other

## 2016-10-18 ENCOUNTER — Encounter (HOSPITAL_COMMUNITY): Payer: Medicare Other

## 2016-10-18 ENCOUNTER — Encounter (HOSPITAL_COMMUNITY): Payer: Medicare Other | Attending: Oncology | Admitting: Oncology

## 2016-10-18 ENCOUNTER — Ambulatory Visit (HOSPITAL_COMMUNITY): Payer: Medicare Other

## 2016-10-18 VITALS — BP 166/88 | HR 73 | Temp 97.8°F | Resp 20

## 2016-10-18 DIAGNOSIS — C719 Malignant neoplasm of brain, unspecified: Secondary | ICD-10-CM | POA: Diagnosis present

## 2016-10-18 DIAGNOSIS — Z5112 Encounter for antineoplastic immunotherapy: Secondary | ICD-10-CM

## 2016-10-18 LAB — URINALYSIS, DIPSTICK ONLY
Bilirubin Urine: NEGATIVE
Glucose, UA: NEGATIVE mg/dL
Hgb urine dipstick: NEGATIVE
Ketones, ur: NEGATIVE mg/dL
LEUKOCYTES UA: NEGATIVE
NITRITE: NEGATIVE
PH: 5.5 (ref 5.0–8.0)
Protein, ur: NEGATIVE mg/dL
SPECIFIC GRAVITY, URINE: 1.02 (ref 1.005–1.030)

## 2016-10-18 LAB — COMPREHENSIVE METABOLIC PANEL
ALBUMIN: 4.1 g/dL (ref 3.5–5.0)
ALK PHOS: 30 U/L — AB (ref 38–126)
ALT: 19 U/L (ref 17–63)
ANION GAP: 6 (ref 5–15)
AST: 24 U/L (ref 15–41)
BUN: 9 mg/dL (ref 6–20)
CALCIUM: 9.8 mg/dL (ref 8.9–10.3)
CHLORIDE: 106 mmol/L (ref 101–111)
CO2: 28 mmol/L (ref 22–32)
CREATININE: 1.03 mg/dL (ref 0.61–1.24)
GFR calc non Af Amer: >60 mL/min (ref >60)
Glucose, Bld: 96 mg/dL (ref 65–99)
Potassium: 4.1 mmol/L (ref 3.5–5.1)
SODIUM: 140 mmol/L (ref 135–145)
Total Bilirubin: 1 mg/dL (ref 0.3–1.2)
Total Protein: 6.6 g/dL (ref 6.5–8.1)

## 2016-10-18 LAB — CBC WITH DIFFERENTIAL/PLATELET
BASOS ABS: 0 10*3/uL (ref 0.0–0.1)
BASOS PCT: 1 %
EOS ABS: 0 10*3/uL (ref 0.0–0.7)
EOS PCT: 1 %
HCT: 35.1 % — ABNORMAL LOW (ref 39.0–52.0)
HEMOGLOBIN: 12.3 g/dL — AB (ref 13.0–17.0)
Lymphocytes Relative: 23 %
Lymphs Abs: 0.9 10*3/uL (ref 0.7–4.0)
MCH: 34.5 pg — AB (ref 26.0–34.0)
MCHC: 35 g/dL (ref 30.0–36.0)
MCV: 98.3 fL (ref 78.0–100.0)
Monocytes Absolute: 0.4 10*3/uL (ref 0.1–1.0)
Monocytes Relative: 10 %
NEUTROS PCT: 65 %
Neutro Abs: 2.5 10*3/uL (ref 1.7–7.7)
PLATELETS: 119 10*3/uL — AB (ref 150–400)
RBC: 3.57 MIL/uL — AB (ref 4.22–5.81)
RDW: 13.4 % (ref 11.5–15.5)
WBC: 3.8 10*3/uL — AB (ref 4.0–10.5)

## 2016-10-18 MED ORDER — SODIUM CHLORIDE 0.9 % IV SOLN
Freq: Once | INTRAVENOUS | Status: AC
  Administered 2016-10-18: 11:00:00 via INTRAVENOUS

## 2016-10-18 MED ORDER — SODIUM CHLORIDE 0.9 % IV SOLN
9.5000 mg/kg | Freq: Once | INTRAVENOUS | Status: AC
  Administered 2016-10-18: 700 mg via INTRAVENOUS
  Filled 2016-10-18: qty 16

## 2016-10-18 NOTE — Patient Instructions (Addendum)
[place] at [facility] Discharge Instructions  RECOMMENDATIONS MADE BY THE CONSULTANT AND ANY TEST RESULTS WILL BE SENT TO YOUR REFERRING PHYSICIAN.  Treatment today  Continue with your blood pressure medication but take it in the morning.  Return next week for blood pressure check.  Treatment every 2 weeks  Dr. Whitney Muse follow up in 4 weeks  Thank you for choosing [place] at [facility] to provide your oncology and hematology care.  To afford each patient quality time with our provider, please arrive at least 15 minutes before your scheduled appointment time.    If you have a lab appointment with the [facility] please come in thru the  Main Entrance and check in at the main information desk  You need to re-schedule your appointment should you arrive 10 or more minutes late.  We strive to give you quality time with our providers, and arriving late affects you and other patients whose appointments are after yours.  Also, if you no show three or more times for appointments you may be dismissed from the clinic at the providers discretion.     Again, thank you for choosing [facility].  Our hope is that these requests will decrease the amount of time that you wait before being seen by our physicians.       _____________________________________________________________  Should you have questions after your visit to [facility], please contact our office at [phone] between the hours of 8:30 a.m. and 4:30 p.m.  Voicemails left after 4:30 p.m. will not be returned until the following business day.  For prescription refill requests, have your pharmacy contact our office.       Resources For Cancer Patients and their Caregivers ? American Cancer Society: Can assist with transportation, wigs, general needs, runs Look Good Feel Better.        217 210 3631 ? Cancer Care: Provides financial assistance, online  support groups, medication/co-pay assistance.  1-800-813-HOPE 4796424506) ? [place] Assists [place] cancer patients and their families through emotional , educational and financial support.  720-719-7187 ? [place] DSS Where to apply for food stamps, Medicaid and utility assistance. 720-298-0904 ? RCATS: Transportation to medical appointments. 414-116-3732 ? Social Security Administration: May apply for disability if have a Stage IV cancer. [phone] 5136799760 ? [employer], Disability and Transit Services: Assists with nutrition, care and transit needs. [place] Support Programs: @10RELATIVEDAYS @ > Cancer Support Group  2nd Tuesday of the month 1pm-2pm, Journey Room  > Creative Journey  3rd Tuesday of the month 1130am-1pm, Journey Room  > Look Good Feel Better  1st Wednesday of the month 10am-12 noon, Journey Room (Call [place] to register [phone])

## 2016-10-18 NOTE — Progress Notes (Signed)
[name], MD [place] [id number]  Glioblastoma multiforme of brain [facility]) - Plan: MR Brain W Wo Contrast  CURRENT THERAPY: Avastin 10 mg/kg every 2 weeks beginning on 09/20/2016.  INTERVAL HISTORY: [place] Armor Bugh 76 y.o. 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 Owens Shark at [facility].  S/P Temodar with concurrent XRT and began adjuvant Temodar on [id number], complicated by Keppra-induced thrombocytopenia (resolved with discontinuation of Keppra).  Temodar discontinued on 08/27/2016 due to ongoing and significant weight loss and progression of disease on MRI imaging.  Avastin started on 09/20/2016.    Glioblastoma multiforme of brain ([place])   07/19/2016 Treatment Plan Change    Dose of Temodar reduced to 150 mg/m2      07/19/2016 Adverse Reaction    Weight loss, poor tolerance, thrombocytopenia secondary to Temodar 200 mg/m2      07/25/2016 Treatment Plan Change    Treatment held x 1 week      07/25/2016 Imaging    [place] LE B/L- No evidence of DVT within either lower extremity.      08/01/2016 Echocardiogram    Left ventricle: The cavity size was normal. Wall thickness was normal. Systolic function was normal. The estimated ejection fraction was in the range of 50% to 55%. Wall motion was normal; there were no regional wall motion abnormalities. Doppler parameters are consistent with abnormal left ventricular relaxation (grade 1 diastolic dysfunction). Doppler parameters are consistent with high ventricular filling pressure. - Left atrium: The atrium was mildly dilated. - Tricuspid valve: There was mild regurgitation.      08/04/2016 - 08/27/2016 Chemotherapy    Temodar 150 mg/m2 (beginning on 10/21)       08/29/2016 Imaging    MRI brain- 1. New marginal enhancement and increased signal abnormality  around the right parietal glioblastoma resection cavity compatible with progression. Cannot exclude radiation changes. 2. There is new 2 mm focus of white matter enhancement in the parasagittal right frontal lobe. This is primarily concerning for distant site of disease progression, need correlation with radiation dose distribution.      09/20/2016 -  Chemotherapy    Avastin 10 mg/kg every 2 weeks        He is tolerating therapy well.  He denies any nausea or vomiting.  He denies any bleeding or GERD issues.    He denies any new abdominal pain or chest pain.  His appetite is improved and his wife confirms this.  His weight is stable and improved.  Review of Systems  Constitutional: Negative for chills, fever and weight loss.  HENT: Negative for tinnitus.   Eyes: Negative.  Negative for blurred vision and double vision.  Respiratory: Negative.  Negative for cough.   Cardiovascular: Negative.  Negative for chest pain.  Gastrointestinal: Negative.  Negative for constipation, diarrhea, nausea and vomiting.  Genitourinary: Negative.   Musculoskeletal: Negative.   Neurological: Negative.  Negative for headaches.  Endo/Heme/Allergies: Negative.   Psychiatric/Behavioral: Positive for memory loss.    Past Medical History:  Diagnosis Date  . Brain cancer ([place])   . Diabetes mellitus without complication ([place])   . Glioblastoma multiforme of brain ([place]) 10/27/2015    Past Surgical History:  Procedure Laterality Date  . CHOLECYSTECTOMY      Family History  Problem Relation Age of Onset  . Cancer Mother   .  Diabetes Mother   . Cancer Father   . Cancer Brother     Social History   Social History  . Marital status: Married    Spouse name: N/A  . Number of children: N/A  . Years of education: N/A   Social History Main Topics  . Smoking status: Former Research scientist (life sciences)  . Smokeless tobacco: Never Used  . Alcohol use No  . Drug use: No  . Sexual activity: No   Other Topics  Concern  . Not on file   Social History Narrative  . No narrative on file     PHYSICAL EXAMINATION  ECOG PERFORMANCE STATUS: 2 - Symptomatic, <50% confined to bed  Vitals:   10/18/16 0922  BP: (!) 161/74  Pulse: 66  Resp: 20  Temp: 97.7 F (36.5 C)    GENERAL:alert, no distress, comfortable, cooperative, smiling and accompanied by his wife, in wheelchair. SKIN: skin color, texture, turgor are normal, no rashes or significant lesions HEAD: Normocephalic, No masses, lesions, tenderness or abnormalities EYES: normal, EOMI, Conjunctiva are pink and non-injected EARS: External ears normal OROPHARYNX:lips, buccal mucosa, and tongue normal and mucous membranes are moist  NECK: supple, trachea midline LYMPH:  no palpable lymphadenopathy BREAST:not examined LUNGS: clear to auscultation  HEART: regular rate & rhythm ABDOMEN:abdomen soft and normal bowel sounds BACK: Back symmetric, no curvature. EXTREMITIES:less then 2 second capillary refill, no joint deformities, effusion, or inflammation, no skin discoloration, no cyanosis  NEURO: alert & oriented x 3 with fluent speech, no focal motor/sensory deficits, in wheelchair  LABORATORY DATA: CBC    Component Value Date/Time   WBC 3.8 (L) 10/18/2016 0856   RBC 3.57 (L) 10/18/2016 0856   HGB 12.3 (L) 10/18/2016 0856   HCT 35.1 (L) 10/18/2016 0856   PLT 119 (L) 10/18/2016 0856   MCV 98.3 10/18/2016 0856   MCH 34.5 (H) 10/18/2016 0856   MCHC 35.0 10/18/2016 0856   RDW 13.4 10/18/2016 0856   LYMPHSABS 0.9 10/18/2016 0856   MONOABS 0.4 10/18/2016 0856   EOSABS 0.0 10/18/2016 0856   BASOSABS 0.0 10/18/2016 0856      Chemistry      Component Value Date/Time   NA 140 10/18/2016 0856   K 4.1 10/18/2016 0856   CL 106 10/18/2016 0856   CO2 28 10/18/2016 0856   BUN 9 10/18/2016 0856   CREATININE 1.03 10/18/2016 0856      Component Value Date/Time   CALCIUM 9.8 10/18/2016 0856   ALKPHOS 30 (L) 10/18/2016 0856   AST 24  10/18/2016 0856   ALT 19 10/18/2016 0856   BILITOT 1.0 10/18/2016 0856        PENDING LABS:   RADIOGRAPHIC STUDIES:  No results found.   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 Owens Shark with removal of 80% of tumor followed by concomitant Temodar + XRT.  He started his first cycle of adjuvant temozolomide on 02/21/2016.  Future cycles werre complicated by Keppra-induced thrombocytopenia (without documented seizure activity) and therefore, Keppra was discontinued on 04/27/2016 with significant improvement in platelet count and dose escalation of Temodar to 200 mg/m2.  However, performance status and blood counts declined requiring a change back to 150 mg/m2 of Temodar on 08/02/2016.  Temodar discontinued on 08/27/2016 due to ongoing and significant weight loss and progression of disease on MRI imaging.  Avastin started on 09/20/2016.  Oncology history is updated.  Pre-treatment labs today: CBC diff, CMET, Urine dipstick.  I personally reviewed and went over laboratory results with the patient.  The results are noted within this dictation.  Elevated BP noted today.  He did take his antihypertensives as prescribed.  I have asked nursing to recheck BP prior to Avastin.  Recheck BP was consistently above baseline: 156/79.  He has not missed any doses of his antihypertensive medication.  He has not taken yet and usually takes it in the afternoon.  He will get Avastin today and start taking his BP medication in the AM.  He will return next week for BP check.  If still elevated, then I will increase his dose of Lisinopril.  Weight is up 2 lbs.  Burtis Junes, RD is following patient along.  Per Nathan's past conversations, I re-enforced the importance of good hydration, but given his current chemotherapy regimen, it would be reasonable to  continue with H2O but also favor liquids with calories.    He is due for repeat brain imaging in Feb 2018.  MRI is ordered and is expected to be completed at the beginning of Feb 2018.  Return next week for BP check.  Return in 2 weeks for treatment and 4 weeks for treatment with follow-up.    ORDERS PLACED FOR THIS ENCOUNTER: Orders Placed This Encounter  Procedures  . MR Brain W Wo Contrast    MEDICATIONS PRESCRIBED THIS ENCOUNTER: No orders of the defined types were placed in this encounter.   THERAPY PLAN:  Continue Avastin every 2 weeks  All questions were answered. The patient knows to call the clinic with any problems, questions or concerns. We can certainly see the patient much sooner if necessary.  Patient and plan discussed with Dr. Ancil Linsey and she is in agreement with the aforementioned.   This note is electronically signed by: Doy Mince 10/18/2016 10:47 AM

## 2016-10-18 NOTE — Progress Notes (Signed)
Phillip Mckay tolerated chemo tx well without complaints or incident.Labs,urine protein and B/P reviewed prior to administering chemotherapy. VSS upon discharge.Pt has not taken his Lisinopril yet today and will take it as soon as he gets home today. Pt discharged via wheelchair in satisfactory condition accompanied by his wife

## 2016-10-18 NOTE — Assessment & Plan Note (Addendum)
Right parietal lobe Glioblastoma Multiforme, grade 4, unknown methylation status of MGMT, S/P partial resection on 10/25/2015 by Dr. Owens Shark with removal of 80% of tumor followed by concomitant Temodar + XRT.  He started his first cycle of adjuvant temozolomide on 02/21/2016.  Future cycles werre complicated by Keppra-induced thrombocytopenia (without documented seizure activity) and therefore, Keppra was discontinued on 04/27/2016 with significant improvement in platelet count and dose escalation of Temodar to 200 mg/m2.  However, performance status and blood counts declined requiring a change back to 150 mg/m2 of Temodar on 08/02/2016.  Temodar discontinued on 08/27/2016 due to ongoing and significant weight loss and progression of disease on MRI imaging.  Avastin started on 09/20/2016.  Oncology history is updated.  Pre-treatment labs today: CBC diff, CMET, Urine dipstick.  I personally reviewed and went over laboratory results with the patient.  The results are noted within this dictation.  Elevated BP noted today.  He did take his antihypertensives as prescribed.  I have asked nursing to recheck BP prior to Avastin.  Recheck BP was consistently above baseline: 156/79.  He has not missed any doses of his antihypertensive medication.  He has not taken yet and usually takes it in the afternoon.  He will get Avastin today and start taking his BP medication in the AM.  He will return next week for BP check.  If still elevated, then I will increase his dose of Lisinopril.  Weight is up 2 lbs.  Burtis Junes, RD is following patient along.  Per Nathan's past conversations, I re-enforced the importance of good hydration, but given his current chemotherapy regimen, it would be reasonable to continue with H2O but also favor liquids with calories.    He is due for repeat brain imaging in Feb 2018.  MRI is ordered and is expected to be completed at the beginning of Feb 2018.  Return next week for BP check.  Return  in 2 weeks for treatment and 4 weeks for treatment with follow-up.

## 2016-10-18 NOTE — Patient Instructions (Signed)
[facility] Discharge Instructions for Patients Receiving Chemotherapy   Beginning January 23rd 2017 lab work for the Cancer Center will be done in the  Main lab at [facility] on 1st floor. If you have a lab appointment with the Cancer Center please come in thru the  Main Entrance and check in at the main information desk   Today you received the following chemotherapy agents Avastin. Follow-up as scheduled. Call clinic for any questions or concerns  To help prevent nausea and vomiting after your treatment, we encourage you to take your nausea medication   If you develop nausea and vomiting, or diarrhea that is not controlled by your medication, call the clinic.  The clinic phone number is [phone]. Office hours are Monday-Friday 8:30am-5:00pm.  BELOW ARE SYMPTOMS THAT SHOULD BE REPORTED IMMEDIATELY:  *FEVER GREATER THAN 101.0 F  *CHILLS WITH OR WITHOUT FEVER  NAUSEA AND VOMITING THAT IS NOT CONTROLLED WITH YOUR NAUSEA MEDICATION  *UNUSUAL SHORTNESS OF BREATH  *UNUSUAL BRUISING OR BLEEDING  TENDERNESS IN MOUTH AND THROAT WITH OR WITHOUT PRESENCE OF ULCERS  *URINARY PROBLEMS  *BOWEL PROBLEMS  UNUSUAL RASH Items with * indicate a potential emergency and should be followed up as soon as possible. If you have an emergency after office hours please contact your primary care physician or go to the nearest emergency department.  Please call the clinic during office hours if you have any questions or concerns.   You may also contact the Patient Navigator at [phone] should you have any questions or need assistance in obtaining follow up care.      Resources For Cancer Patients and their Caregivers ? American Cancer Society: Can assist with transportation, wigs, general needs, runs Look Good Feel Better.        1-888-227-6333 ? Cancer Care: Provides financial assistance, online support groups, medication/co-pay assistance.  1-800-813-HOPE  (4673) ? [facility] Assists [place] cancer patients and their families through emotional , educational and financial support.  336-427-4357 ? [place] DSS Where to apply for food stamps, Medicaid and utility assistance. 336-342-1394 ? RCATS: Transportation to medical appointments. 336-347-2287 ? Social Security Administration: May apply for disability if have a Stage IV cancer. 336-342-7796 1-800-772-1213 ? [place] Aging, Disability and Transit Services: Assists with nutrition, care and transit needs. [phone]         

## 2016-10-23 ENCOUNTER — Other Ambulatory Visit (HOSPITAL_COMMUNITY): Payer: Self-pay | Admitting: Hematology & Oncology

## 2016-10-23 DIAGNOSIS — C719 Malignant neoplasm of brain, unspecified: Secondary | ICD-10-CM

## 2016-10-24 ENCOUNTER — Encounter (HOSPITAL_BASED_OUTPATIENT_CLINIC_OR_DEPARTMENT_OTHER): Payer: Medicare Other | Admitting: *Deleted

## 2016-10-24 ENCOUNTER — Encounter (HOSPITAL_COMMUNITY): Payer: Self-pay

## 2016-10-24 VITALS — BP 139/78 | HR 84 | Resp 16

## 2016-10-24 DIAGNOSIS — R030 Elevated blood-pressure reading, without diagnosis of hypertension: Secondary | ICD-10-CM

## 2016-10-24 NOTE — Progress Notes (Signed)
Pt in today for BP check only. Pt's BP today is 139/78. I spoke with Dr. Whitney Muse and Kirby Crigler PA-C and they advised that the medication change was working and to have the pt continue what he is doing.   I advised the pt and his wife of above. Pt stable and discharged home with wife.

## 2016-10-26 ENCOUNTER — Telehealth (HOSPITAL_COMMUNITY): Payer: Self-pay

## 2016-10-26 NOTE — Telephone Encounter (Signed)
Patients wife called stating patients left hand was "limp" and his left leg "was not moving as well". She states patient almost fell this morning because of the decrease in mobility of his left leg. I asked wife to have patient smile and she stated his smile was the same as it always is. I then asked her to have patient stick his tongue out and she stated it was straight. His speech is okay, just having a difference in grips and weakness of left leg and hand. Reviewed with PA-C. Instructed wife to take patient to the hospital asap. She verbalized understanding.

## 2016-10-31 ENCOUNTER — Telehealth (HOSPITAL_COMMUNITY): Payer: Self-pay | Admitting: Emergency Medicine

## 2016-10-31 ENCOUNTER — Telehealth (HOSPITAL_COMMUNITY): Payer: Self-pay | Admitting: Oncology

## 2016-10-31 NOTE — Telephone Encounter (Signed)
Called wife to let her know that if they could get Azeez to the ER here tomorrow that would be OK and Dr Whitney Muse would get things moving and get him transferred if he needed to be.  She verbalized understanding.

## 2016-10-31 NOTE — Telephone Encounter (Addendum)
Case discussed with Dr. [place] Crane (neuroradiologist) regarding recent imaging (1/12) at [facility].  Generally speaking, the leading edge of the resection site shows possible signs of progression of disease.  Rest of resection site is stable.  On the contralateral parietal area, there is some abnormality that is not obviously progression of disease, but could be secondary to seizure activity.  Etiology of this contralateral area is unclear.  Dr. Whitney Muse is planning on calling the daughter today to explain the situation.  Robynn Pane, PA-C 10/31/2016 12:20 PM

## 2016-11-01 ENCOUNTER — Ambulatory Visit (HOSPITAL_COMMUNITY): Payer: Medicare Other

## 2016-11-01 ENCOUNTER — Other Ambulatory Visit (HOSPITAL_COMMUNITY): Payer: Medicare Other

## 2016-11-02 ENCOUNTER — Inpatient Hospital Stay (HOSPITAL_COMMUNITY)
Admission: EM | Admit: 2016-11-02 | Discharge: 2016-11-08 | DRG: 091 | Disposition: A | Discharge status: Skilled Nursing Facility | Payer: Medicare Other | Attending: Internal Medicine | Admitting: Internal Medicine

## 2016-11-02 ENCOUNTER — Emergency Department (HOSPITAL_COMMUNITY): Payer: Medicare Other

## 2016-11-02 ENCOUNTER — Encounter (HOSPITAL_COMMUNITY): Payer: Self-pay | Admitting: Emergency Medicine

## 2016-11-02 ENCOUNTER — Other Ambulatory Visit: Payer: Self-pay

## 2016-11-02 DIAGNOSIS — Z923 Personal history of irradiation: Secondary | ICD-10-CM | POA: Diagnosis not present

## 2016-11-02 DIAGNOSIS — Z87891 Personal history of nicotine dependence: Secondary | ICD-10-CM

## 2016-11-02 DIAGNOSIS — R262 Difficulty in walking, not elsewhere classified: Secondary | ICD-10-CM

## 2016-11-02 DIAGNOSIS — R778 Other specified abnormalities of plasma proteins: Secondary | ICD-10-CM | POA: Diagnosis present

## 2016-11-02 DIAGNOSIS — R4182 Altered mental status, unspecified: Secondary | ICD-10-CM | POA: Insufficient documentation

## 2016-11-02 DIAGNOSIS — J690 Pneumonitis due to inhalation of food and vomit: Secondary | ICD-10-CM | POA: Diagnosis present

## 2016-11-02 DIAGNOSIS — C7931 Secondary malignant neoplasm of brain: Secondary | ICD-10-CM | POA: Diagnosis present

## 2016-11-02 DIAGNOSIS — I5023 Acute on chronic systolic (congestive) heart failure: Secondary | ICD-10-CM | POA: Diagnosis not present

## 2016-11-02 DIAGNOSIS — Z79899 Other long term (current) drug therapy: Secondary | ICD-10-CM | POA: Diagnosis not present

## 2016-11-02 DIAGNOSIS — G8194 Hemiplegia, unspecified affecting left nondominant side: Secondary | ICD-10-CM

## 2016-11-02 DIAGNOSIS — R7989 Other specified abnormal findings of blood chemistry: Secondary | ICD-10-CM | POA: Diagnosis present

## 2016-11-02 DIAGNOSIS — J9601 Acute respiratory failure with hypoxia: Secondary | ICD-10-CM | POA: Diagnosis present

## 2016-11-02 DIAGNOSIS — Z66 Do not resuscitate: Secondary | ICD-10-CM | POA: Diagnosis not present

## 2016-11-02 DIAGNOSIS — I110 Hypertensive heart disease with heart failure: Secondary | ICD-10-CM | POA: Diagnosis not present

## 2016-11-02 DIAGNOSIS — Z794 Long term (current) use of insulin: Secondary | ICD-10-CM

## 2016-11-02 DIAGNOSIS — G92 Toxic encephalopathy: Principal | ICD-10-CM | POA: Diagnosis present

## 2016-11-02 DIAGNOSIS — G9340 Encephalopathy, unspecified: Secondary | ICD-10-CM | POA: Diagnosis present

## 2016-11-02 DIAGNOSIS — E119 Type 2 diabetes mellitus without complications: Secondary | ICD-10-CM

## 2016-11-02 DIAGNOSIS — Z951 Presence of aortocoronary bypass graft: Secondary | ICD-10-CM

## 2016-11-02 DIAGNOSIS — Z833 Family history of diabetes mellitus: Secondary | ICD-10-CM | POA: Diagnosis not present

## 2016-11-02 DIAGNOSIS — R9389 Abnormal findings on diagnostic imaging of other specified body structures: Secondary | ICD-10-CM

## 2016-11-02 DIAGNOSIS — I10 Essential (primary) hypertension: Secondary | ICD-10-CM | POA: Diagnosis present

## 2016-11-02 DIAGNOSIS — K219 Gastro-esophageal reflux disease without esophagitis: Secondary | ICD-10-CM | POA: Diagnosis present

## 2016-11-02 DIAGNOSIS — W19XXXA Unspecified fall, initial encounter: Secondary | ICD-10-CM

## 2016-11-02 DIAGNOSIS — T451X5A Adverse effect of antineoplastic and immunosuppressive drugs, initial encounter: Secondary | ICD-10-CM | POA: Diagnosis present

## 2016-11-02 DIAGNOSIS — C719 Malignant neoplasm of brain, unspecified: Secondary | ICD-10-CM | POA: Diagnosis present

## 2016-11-02 DIAGNOSIS — I6783 Posterior reversible encephalopathy syndrome: Secondary | ICD-10-CM

## 2016-11-02 DIAGNOSIS — R079 Chest pain, unspecified: Secondary | ICD-10-CM

## 2016-11-02 DIAGNOSIS — R0902 Hypoxemia: Secondary | ICD-10-CM

## 2016-11-02 DIAGNOSIS — R0602 Shortness of breath: Secondary | ICD-10-CM

## 2016-11-02 DIAGNOSIS — S0990XA Unspecified injury of head, initial encounter: Secondary | ICD-10-CM

## 2016-11-02 DIAGNOSIS — E876 Hypokalemia: Secondary | ICD-10-CM

## 2016-11-02 LAB — COMPREHENSIVE METABOLIC PANEL
ALK PHOS: 36 U/L — AB (ref 38–126)
ALT: 27 U/L (ref 17–63)
AST: 25 U/L (ref 15–41)
Albumin: 3.9 g/dL (ref 3.5–5.0)
Anion gap: 10 (ref 5–15)
BUN: 19 mg/dL (ref 6–20)
CHLORIDE: 111 mmol/L (ref 101–111)
CO2: 24 mmol/L (ref 22–32)
Calcium: 9.4 mg/dL (ref 8.9–10.3)
Creatinine, Ser: 0.97 mg/dL (ref 0.61–1.24)
GFR calc non Af Amer: >60 mL/min (ref >60)
GLUCOSE: 136 mg/dL — AB (ref 65–99)
Potassium: 4.5 mmol/L (ref 3.5–5.1)
SODIUM: 145 mmol/L (ref 135–145)
Total Bilirubin: 1.1 mg/dL (ref 0.3–1.2)
Total Protein: 6.5 g/dL (ref 6.5–8.1)

## 2016-11-02 LAB — URINALYSIS, MICROSCOPIC (REFLEX)

## 2016-11-02 LAB — CBC
HCT: 28.7 % — ABNORMAL LOW (ref 39.0–52.0)
Hemoglobin: 10 g/dL — ABNORMAL LOW (ref 13.0–17.0)
MCH: 34.6 pg — ABNORMAL HIGH (ref 26.0–34.0)
MCHC: 34.8 g/dL (ref 30.0–36.0)
MCV: 99.3 fL (ref 78.0–100.0)
PLATELETS: 146 10*3/uL — AB (ref 150–400)
RBC: 2.89 MIL/uL — ABNORMAL LOW (ref 4.22–5.81)
RDW: 14.5 % (ref 11.5–15.5)
WBC: 11.7 10*3/uL — ABNORMAL HIGH (ref 4.0–10.5)

## 2016-11-02 LAB — URINALYSIS, ROUTINE W REFLEX MICROSCOPIC
BILIRUBIN URINE: NEGATIVE
GLUCOSE, UA: NEGATIVE mg/dL
KETONES UR: NEGATIVE mg/dL
LEUKOCYTES UA: NEGATIVE
Nitrite: NEGATIVE
PH: 5.5 (ref 5.0–8.0)
PROTEIN: 100 mg/dL — AB
Specific Gravity, Urine: >1.03 — ABNORMAL HIGH (ref 1.005–1.030)

## 2016-11-02 LAB — GLUCOSE, CAPILLARY: GLUCOSE-CAPILLARY: 112 mg/dL — AB (ref 65–99)

## 2016-11-02 LAB — CBG MONITORING, ED: GLUCOSE-CAPILLARY: 113 mg/dL — AB (ref 65–99)

## 2016-11-02 MED ORDER — TRAMADOL HCL 50 MG PO TABS
50.0000 mg | ORAL_TABLET | Freq: Four times a day (QID) | ORAL | Status: DC | PRN
  Administered 2016-11-04: 50 mg via ORAL
  Filled 2016-11-02: qty 1

## 2016-11-02 MED ORDER — DEXAMETHASONE 4 MG PO TABS
4.0000 mg | ORAL_TABLET | Freq: Every day | ORAL | Status: DC
Start: 2016-11-03 — End: 2016-11-08
  Administered 2016-11-03 – 2016-11-08 (×6): 4 mg via ORAL
  Filled 2016-11-02 (×6): qty 1

## 2016-11-02 MED ORDER — PROCHLORPERAZINE MALEATE 10 MG PO TABS: 10.0000 mg | ORAL_TABLET | Freq: Four times a day (QID) | ORAL | Status: DC | PRN

## 2016-11-02 MED ORDER — SODIUM CHLORIDE 0.9 % IV BOLUS (SEPSIS)
1000.0000 mL | Freq: Once | INTRAVENOUS | Status: AC
  Administered 2016-11-02: 1000 mL via INTRAVENOUS

## 2016-11-02 MED ORDER — TAMSULOSIN HCL 0.4 MG PO CAPS
0.4000 mg | ORAL_CAPSULE | Freq: Every day | ORAL | Status: DC
  Administered 2016-11-03 – 2016-11-08 (×6): 0.4 mg via ORAL
  Filled 2016-11-02 (×6): qty 1

## 2016-11-02 MED ORDER — ONDANSETRON HCL 4 MG/2ML IJ SOLN: 4.0000 mg | Freq: Four times a day (QID) | INTRAMUSCULAR | Status: DC | PRN

## 2016-11-02 MED ORDER — INSULIN ASPART 100 UNIT/ML ~~LOC~~ SOLN
0.0000 [IU] | Freq: Every day | SUBCUTANEOUS | Status: DC
Start: 2016-11-02 — End: 2016-11-08
  Administered 2016-11-06: 2 [IU] via SUBCUTANEOUS
  Administered 2016-11-07: 3 [IU] via SUBCUTANEOUS

## 2016-11-02 MED ORDER — ACETAMINOPHEN 650 MG RE SUPP: 650.0000 mg | Freq: Four times a day (QID) | RECTAL | Status: DC | PRN

## 2016-11-02 MED ORDER — PANTOPRAZOLE SODIUM 40 MG IV SOLR
40.0000 mg | Freq: Every day | INTRAVENOUS | Status: DC
  Administered 2016-11-03 – 2016-11-04 (×2): 40 mg via INTRAVENOUS
  Filled 2016-11-02 (×2): qty 40

## 2016-11-02 MED ORDER — LEVETIRACETAM 500 MG PO TABS
500.0000 mg | ORAL_TABLET | Freq: Two times a day (BID) | ORAL | Status: DC
  Administered 2016-11-02 – 2016-11-04 (×5): 500 mg via ORAL
  Filled 2016-11-02 (×6): qty 1

## 2016-11-02 MED ORDER — SODIUM CHLORIDE 0.9 % IV SOLN: INTRAVENOUS | Status: DC

## 2016-11-02 MED ORDER — GABAPENTIN 300 MG PO CAPS
300.0000 mg | ORAL_CAPSULE | Freq: Three times a day (TID) | ORAL | Status: DC
  Administered 2016-11-02 – 2016-11-08 (×17): 300 mg via ORAL
  Filled 2016-11-02 (×17): qty 1

## 2016-11-02 MED ORDER — SODIUM CHLORIDE 0.9 % IV SOLN
INTRAVENOUS | Status: DC
Start: 2016-11-02 — End: 2016-11-05
  Administered 2016-11-02 – 2016-11-04 (×3): via INTRAVENOUS

## 2016-11-02 MED ORDER — LORAZEPAM 2 MG/ML IJ SOLN
0.5000 mg | Freq: Once | INTRAMUSCULAR | Status: AC
  Administered 2016-11-02: 0.5 mg via INTRAVENOUS
  Filled 2016-11-02: qty 1

## 2016-11-02 MED ORDER — METFORMIN HCL 500 MG PO TABS
1000.0000 mg | ORAL_TABLET | Freq: Every day | ORAL | Status: DC
  Administered 2016-11-03: 1000 mg via ORAL
  Filled 2016-11-02: qty 2

## 2016-11-02 MED ORDER — ONDANSETRON HCL 4 MG PO TABS: 4.0000 mg | ORAL_TABLET | Freq: Four times a day (QID) | ORAL | Status: DC | PRN

## 2016-11-02 MED ORDER — SODIUM CHLORIDE 0.9% FLUSH
3.0000 mL | Freq: Two times a day (BID) | INTRAVENOUS | Status: DC
  Administered 2016-11-05 – 2016-11-07 (×4): 3 mL via INTRAVENOUS

## 2016-11-02 MED ORDER — SIMVASTATIN 20 MG PO TABS
20.0000 mg | ORAL_TABLET | Freq: Every evening | ORAL | Status: DC
  Administered 2016-11-02 – 2016-11-07 (×6): 20 mg via ORAL
  Filled 2016-11-02 (×6): qty 1

## 2016-11-02 MED ORDER — ACETAMINOPHEN 325 MG PO TABS
650.0000 mg | ORAL_TABLET | Freq: Four times a day (QID) | ORAL | Status: DC | PRN
  Administered 2016-11-05: 650 mg via ORAL
  Filled 2016-11-02: qty 2

## 2016-11-02 MED ORDER — ZIPRASIDONE MESYLATE 20 MG IM SOLR: 10.0000 mg | INTRAMUSCULAR | Status: DC | PRN

## 2016-11-02 MED ORDER — INSULIN ASPART 100 UNIT/ML ~~LOC~~ SOLN
0.0000 [IU] | Freq: Three times a day (TID) | SUBCUTANEOUS | Status: DC
Start: 2016-11-03 — End: 2016-11-08
  Administered 2016-11-04 – 2016-11-05 (×2): 7 [IU] via SUBCUTANEOUS
  Administered 2016-11-06: 3 [IU] via SUBCUTANEOUS
  Administered 2016-11-06 – 2016-11-07 (×2): 7 [IU] via SUBCUTANEOUS
  Administered 2016-11-07: 4 [IU] via SUBCUTANEOUS
  Administered 2016-11-07 – 2016-11-08 (×2): 3 [IU] via SUBCUTANEOUS
  Administered 2016-11-08: 4 [IU] via SUBCUTANEOUS

## 2016-11-02 NOTE — H&P (Signed)
History and Physical  Phi Avans EOF:[id number] DOB: Aug 27, 1941 DOA: 11/02/2016  Referring physician: Dr Gilford Raid, ED physician PCP: [name], MD  Outpatient Specialists:   Dr Whitney Muse (Oncology)  Chief Complaint: Altered mental status  HPI: Phillip Mckay is a 76 y.o. male with a history of right glioblastoma multiforme grade 4 with partial resection in 10/25/2015 with 80% of the tumor followed by Temodar and radiation with transition to Avastin on 09/20/16, diabetes without complication, seizure disorder with treatment on Keppra followed by Keppra-induced thrombocytopenia with discontinuation of the Keppra and 04/27/16. History is provided by the Asians wife as the patient is currently having altered mental status. Over the past week, the patient has been having altered mental status with visual hallucinations. 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 There is no evidence of stroke. There did appear to be edema surrounding the tissue the patient was started on Keppra and Decadron. During the hospitalization, the patient was started on vancomycin and Rocephin due to suspicion of infectious etiology despite negative blood cultures. Per the hospital record, the patient's symptoms improved, however the patient's family says that he was still having intermittent visual hallucinations as well as gait instability. He was discharged on 1/16 to nursing home. The patient has had several falls at the nursing home and hit his head today. The patient was transported to the emergency department for continued workup and evaluation. In conversing further with the family, the patient has been having continued visual hallucinations/delirium. He becomes quite agitated and attempts to get out of the bed and remove  lines and monitor monitoring.  Emergency Department Course: CT of the head without any acute change. White blood cell count slightly elevated at 11.7 with other labs are fairly unremarkable. Due to agitation, the patient received Ativan 0.5 mg IV once with good effect.  Review of Systems:  To obtain secondary to altered mental status   Past Medical History:  Diagnosis Date  . Brain cancer ([place])   . Diabetes mellitus without complication ([place])   . Glioblastoma multiforme of brain ([place]) 10/27/2015   Past Surgical History:  Procedure Laterality Date  . CHOLECYSTECTOMY     Social History:  reports that he has quit smoking. He has never used smokeless tobacco. He reports that he does not drink alcohol or use drugs. Patient lives at Home, but comes from skilled nursing facility  No Known Allergies  Family History  Problem Relation Age of Onset  . Cancer Mother   . Diabetes Mother   . Cancer Father   . Cancer Brother       Prior to Admission medications   Medication Sig Start Date End Date Taking? Authorizing Provider  cefTRIAXone (ROCEPHIN) 1 g injection Inject 1 g into the muscle daily.   Yes Historical Provider, MD  cholecalciferol 2000 units tablet Take 1 tablet (2,000 Units total) by mouth daily. Patient taking differently: Take 3,000 Units by mouth daily.  07/02/16  Yes Patrici Ranks, MD  clotrimazole-betamethasone (LOTRISONE) cream Apply 1 application topically 2 (two) times daily. 08/10/16  Yes Manon Hilding Kefalas, PA-C  dexamethasone (DECADRON) 4 MG tablet Take 4 mg by mouth daily.   Yes Historical Provider, MD  Docusate Sodium 100 MG capsule Take 100 mg by mouth 2 (two) times daily.  12/26/15  Yes Historical Provider, MD  gabapentin (NEURONTIN) 300 MG capsule Take 300 mg by mouth 3 (three) times daily. Reported on 04/20/2016 02/03/16  Yes Historical Provider, MD  insulin lispro (HUMALOG) 100 UNIT/ML injection Inject 2-20 Units into the skin See admin instructions. Accu-cheks  twice daily at 7am and 4pm Sliding Scale as follows: 150=2u 250=4u 350=8u 450=12u 550=20u   Yes Historical Provider, MD  KLOR-CON M20 20 MEQ tablet TAKE 2 TABLETS (40 MEQ TOTAL) BY MOUTH DAILY. Patient taking differently: Take 20 mEq by mouth daily.  10/01/16  Yes Manon Hilding Kefalas, PA-C  latanoprost (XALATAN) 0.005 % ophthalmic solution Place 1 drop into both eyes daily. Reported on 04/20/2016   Yes Historical Provider, MD  levETIRAcetam (KEPPRA) 500 MG tablet Take 500 mg by mouth 2 (two) times daily.   Yes Historical Provider, MD  lisinopril (PRINIVIL,ZESTRIL) 40 MG tablet Take 40 mg by mouth daily.  04/02/16  Yes Historical Provider, MD  LORazepam (ATIVAN) 1 MG tablet Take as needed for seizure. Take 1 mg at onset may repeat in 5 minute intervals X 3 Patient taking differently: Take 1 mg by mouth 2 (two) times daily as needed for seizure. Take as needed for seizure. 04/20/16  Yes Patrici Ranks, MD  metFORMIN (GLUCOPHAGE) 1000 MG tablet Take 1,000 mg by mouth daily.    Yes Historical Provider, MD  omeprazole (PRILOSEC) 20 MG capsule Take 20 mg by mouth daily as needed (for GERD).  06/14/16  Yes Historical Provider, MD  ondansetron (ZOFRAN) 8 MG tablet TAKE 1 TABLET BY MOUTH EVERY 8 HOURS AS NEEDED FOR NAUSEA AND VOMITING 09/17/16  Yes Patrici Ranks, MD  Polyethyl Glycol-Propyl Glycol 0.4-0.3 % SOLN Apply 1 drop to eye daily. For dry eye relief   Yes Historical Provider, MD  Probiotic Product [facility]) CAPS Take 3 capsules by mouth daily.    Yes Historical Provider, MD  prochlorperazine (COMPAZINE) 10 MG tablet Take 1 tablet (10 mg total) by mouth every 6 (six) hours as needed for nausea or vomiting. Reported on 04/20/2016 05/24/16  Yes Manon Hilding Kefalas, PA-C  simvastatin (ZOCOR) 20 MG tablet Take 20 mg by mouth every evening. Reported on 04/20/2016 03/09/16  Yes Historical Provider, MD  tamsulosin (FLOMAX) 0.4 MG CAPS capsule Take 0.4 mg by mouth daily.  11/26/15  Yes Historical Provider, MD    tetrahydrozoline 0.05 % ophthalmic solution Place 1 drop into both eyes daily. Reported on 04/20/2016   Yes Historical Provider, MD  traMADol (ULTRAM) 50 MG tablet Take 1 tablet (50 mg total) by mouth every 6 (six) hours as needed. 05/04/16  Yes Patrici Ranks, MD  TRAVATAN Z 0.004 % SOLN ophthalmic solution Place 1 drop into both eyes at bedtime.  07/28/16  Yes Historical Provider, MD  ziprasidone (GEODON) injection Inject 10 mg into the muscle as needed for agitation (and/or restlessness).   Yes Historical Provider, MD  Bevacizumab (AVASTIN IV) Inject into the vein. Every 2 weeks    Historical Provider, MD  pantoprazole (PROTONIX) 40 MG tablet TAKE 1 TABLET (40 MG TOTAL) BY MOUTH DAILY. 10/23/16   Patrici Ranks, MD    Physical Exam: BP 151/85 (BP Location: Right Arm)   Pulse 81   Temp 97.4 F (36.3 C) (Oral)   Resp 20   Ht 5' 6"  (1.676 m)   Wt 73 kg (161 lb)   SpO2 98%   BMI 25.99 kg/m   General: Elderly Caucasian male. Somnolent. No acute cardiopulmonary distress.  HEENT: Normocephalic atraumatic.  Right and left ears  normal in appearance.  Pupils equal, round, reactive to light. Extraocular muscles are intact. Sclerae anicteric and noninjected.  Moist mucosal membranes. No mucosal lesions.  Neck: Neck supple without lymphadenopathy. No carotid bruits. No masses palpated.  Cardiovascular: Regular rate with normal S1-S2 sounds. No murmurs, rubs, gallops auscultated. No JVD.  Respiratory: Good respiratory effort with no wheezes, rales, rhonchi. Lungs clear to auscultation bilaterally.  No accessory muscle use. Abdomen: Soft, nontender, nondistended. Active bowel sounds. No masses or hepatosplenomegaly  Skin: No rashes, lesions, or ulcerations.  Dry, warm to touch. 2+ dorsalis pedis and radial pulses. Musculoskeletal: No calf or leg pain. All major joints not erythematous nontender.  No upper or lower joint deformation.  Good ROM.  No contractures  Psychiatric: Unable to determine at  this point. Neurologic: Patient unable to follow commands at this point           Labs on Admission: I have personally reviewed following labs and imaging studies  CBC:  Recent Labs Lab 11/02/16 1507  WBC 11.7*  HGB 10.0*  HCT 28.7*  MCV 99.3  PLT 829*   Basic Metabolic Panel:  Recent Labs Lab 11/02/16 1507  NA 145  K 4.5  CL 111  CO2 24  GLUCOSE 136*  BUN 19  CREATININE 0.97  CALCIUM 9.4   GFR: Estimated Creatinine Clearance: 58.5 mL/min (by C-G formula based on SCr of 0.97 mg/dL). Liver Function Tests:  Recent Labs Lab 11/02/16 1507  AST 25  ALT 27  ALKPHOS 36*  BILITOT 1.1  PROT 6.5  ALBUMIN 3.9   No results for input(s): LIPASE, AMYLASE in the last 168 hours. No results for input(s): AMMONIA in the last 168 hours. Coagulation Profile: No results for input(s): INR, PROTIME in the last 168 hours. Cardiac Enzymes: No results for input(s): CKTOTAL, CKMB, CKMBINDEX, TROPONINI in the last 168 hours. BNP (last 3 results) No results for input(s): PROBNP in the last 8760 hours. HbA1C: No results for input(s): HGBA1C in the last 72 hours. CBG:  Recent Labs Lab 11/02/16 1527  GLUCAP 113*   Lipid Profile: No results for input(s): CHOL, HDL, LDLCALC, TRIG, CHOLHDL, LDLDIRECT in the last 72 hours. Thyroid Function Tests: No results for input(s): TSH, T4TOTAL, FREET4, T3FREE, THYROIDAB in the last 72 hours. Anemia Panel: No results for input(s): VITAMINB12, FOLATE, FERRITIN, TIBC, IRON, RETICCTPCT in the last 72 hours. Urine analysis:    Component Value Date/Time   COLORURINE YELLOW 11/02/2016 1442   APPEARANCEUR CLEAR 11/02/2016 1442   LABSPEC >1.030 (H) 11/02/2016 1442   PHURINE 5.5 11/02/2016 1442   GLUCOSEU NEGATIVE 11/02/2016 1442   HGBUR TRACE (A) 11/02/2016 1442   BILIRUBINUR NEGATIVE 11/02/2016 1442   [place] 11/02/2016 1442   PROTEINUR 100 (A) 11/02/2016 1442   NITRITE NEGATIVE 11/02/2016 1442   LEUKOCYTESUR NEGATIVE  11/02/2016 1442   Sepsis Labs: @LABRCNTIP (procalcitonin:4,lacticidven:4) )No results found for this or any previous visit (from the past 240 hour(s)).   Radiological Exams on Admission: Dg Chest 1 View  Result Date: 11/02/2016 CLINICAL DATA:  Mental status changes. Fall today. History of glioblastoma of brain. EXAM: CHEST 1 VIEW COMPARISON:  02/03/2016 FINDINGS: The heart size is within normal limits. There is stable ectasia of the thoracic aorta. Lung volumes are low bilaterally. There is no evidence of pulmonary edema, consolidation, pneumothorax, nodule or pleural fluid. The visualized skeletal structures are unremarkable. IMPRESSION: Low bilateral lung volumes.  No active disease. Electronically Signed   By: Aletta Edouard M.D.   On: 11/02/2016  15:48   Ct Head Wo Contrast  Result Date: 11/02/2016 CLINICAL DATA:  Altered mental status with slurred speech and dysphagia. Recent fall. Initial encounter. EXAM: CT HEAD WITHOUT CONTRAST TECHNIQUE: Contiguous axial images were obtained from the base of the skull through the vertex without intravenous contrast. COMPARISON:  Head CT and MRI 10/26/2016 FINDINGS: Brain: There is no evidence of acute infarct, acute intracranial hemorrhage, midline shift, or sizable extra-axial fluid collection. Postsurgical changes are again seen in the right parietal lobe with a resection cavity and surrounding low-density in the white matter as well as some adjacent calcification, not significantly changed from the recent prior CT and more fully evaluated on the recent MRI. Postsurgical changes are also noted in the posterior fossa with mild encephalomalacia and gliosis in the left cerebellum. There is mild global cerebral atrophy. Confluent hypodensities in the cerebral white matter bilaterally are unchanged. Vascular: Minimal calcified atherosclerosis involving the carotid siphons. No hyperdense vessel. Skull: Prior right parietal craniotomy and left retromastoid  craniectomy. Sinuses/Orbits: Trace paranasal sinus mucosal thickening. Clear mastoid air cells. Unremarkable orbits. Other: None. IMPRESSION: 1. No evidence of acute intracranial abnormality. 2. Post treatment changes for glioblastoma, more fully evaluated on recent MRI. Electronically Signed   By: Logan Bores M.D.   On: 11/02/2016 17:07    EKG: Independently reviewed. Sinus rhythm with poor R-wave progression  Assessment/Plan: Principal Problem:   Altered mental status Active Problems:   Diabetes mellitus (HCC)   Essential (primary) hypertension   Acid reflux   Glioblastoma multiforme of brain [facility])    This patient was discussed with the ED physician, including pertinent vitals, physical exam findings, labs, and imaging.  We also discussed care given by the ED provider.  #1 altered mental status  Uncertain of etiology - infectious versus seizure versus other  Admission to [facility] on neuro telemetry  Neurology to evaluate the patient upon arrival -   Will hold antibiotics at this point as very unlikely that this represents abscess.  Possibly will need EEG  ESR, TSH, vitamin B12, CBC in the morning, BMP in the morning  We'll continue Keppra at this point #2 glioblastoma multiforme of brain  Continue Decadron #3 diabetes  Sliding scale insulin  Continue metformin #4 hypertension  Continue home medications #5 reflux  Continue home medications  DVT prophylaxis: TED Consultants: Neurology Code Status: DNR Family Communication: wife, son  Disposition Plan: likely will need to return to SNF/nursing home.   Truett Mainland, DO [facility] Pager [phone]  If 7PM-7AM, please contact night-coverage [email or web address] Password [email or web address]

## 2016-11-02 NOTE — ED Triage Notes (Signed)
Patient sent here from Dr. Maryellen Pile office. Patient is a chemo patient. Family noticed altered mental status Friday morning with slurred speech and dysphagia. Family states patient's symptoms have improved, but with new onset of dementia.   Family states patient fell and hit his head Tuesday night at nursing home. Wife states patient fell today and hit head at nursing home today as well.

## 2016-11-02 NOTE — Consult Note (Signed)
Neurology Consultation Reason for Consult: Mental status Referring Physician: Joyice Faster  CC: Altered mental status  History is obtained from: Chart  HPI: Phillip Mckay is a 76 y.o. male with a history of glioblastoma who presents with altered mental status that started earlier this week. History from the patient is limited because he is confused. Due to concern for recurrence, he has been transitioned to Avastin which was started on 12/7. He has a known history of seizures and has been on Keppra, but developed Keppra induced thrombocytopenia and therefore this was stopped.  He has been confused and having visual hallucinations and was admitted to [facility] 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 It started on Keppra and Decadron. He was also started on vancomycin and Rocephin. He apparently had some improvement, however he has continued to have visual hallucinations and gait instability and has had falls at his nursing home. Due to falls he was brought back to [facility], and transferred to [facility] for consideration of the EEG.   ROS: Unable to obtain due to altered mental status.   Past Medical History:  Diagnosis Date  . Brain cancer ([place])   . Diabetes mellitus without complication ([place])   . Glioblastoma multiforme of brain ([place]) 10/27/2015     Family History  Problem Relation Age of Onset  . Cancer Mother   . Diabetes Mother   . Cancer Father   . Cancer Brother      Social History:  reports that he has quit smoking. He has never used smokeless tobacco. He reports that he does not drink alcohol or use drugs.   Exam: Current vital signs: BP (!) 159/97 (BP Location: Right Arm)   Pulse 94   Temp 97.5 F (36.4 C) (Oral)   Resp 20   Ht 5\' 7"  (1.702 m)   Wt 75.1 kg (165 lb 9.6 oz)   SpO2 98%   BMI 25.94 kg/m  Vital signs in last 24 hours: Temp:  [97.4 F (36.3 C)-97.5 F (36.4 C)] 97.5 F (36.4 C) (01/19 2320) Pulse Rate:  [81-115]  94 (01/19 2320) Resp:  [14-23] 20 (01/19 2320) BP: (150-174)/(85-113) 159/97 (01/19 2320) SpO2:  [97 %-100 %] 98 % (01/19 2320) Weight:  [73 kg (161 lb)-75.1 kg (165 lb 9.6 oz)] 75.1 kg (165 lb 9.6 oz) (01/19 2320)   Physical Exam  Constitutional: Appears well-developed and well-nourished.  Psych: Affect appropriate to situation Eyes: No scleral injection HENT: No OP obstrucion Head: Normocephalic.  Cardiovascular: Normal rate and regular rhythm.  Respiratory: Effort normal and breath sounds normal to anterior ascultation GI: Soft.  No distension. There is no tenderness.  Skin: WDI  Neuro: Mental Status: Patient is awake, alert, He is pleasantly confused, states that he is in the back of a [name] on the way to the grocery store. He gives the month as December, the year as 1918. Cranial Nerves: II: Visual Fields are full. Pupils are equal, round, and reactive to light.   III,IV, VI: EOMI without ptosis or diploplia.  V: Facial sensation is decreased on the left VII: Facial movement is decreased on the left VIII: hearing is intact to voice X: Uvula elevates symmetrically XI: Shoulder shrug is symmetric. XII: tongue is midline without atrophy or fasciculations.  Motor: Tone is normal. Bulk is normal. 5/5 strength was present  On the right side, he has a mild left hemiparesis in the arm and leg  Sensory: Decreased to light touch in the arm and leg  on the left. Cerebellar: No clear ataxia      I have reviewed labs in epic and the results pertinent to this consultation are: cmp - unremarkable CBC- mild leukocytosis  I have reviewed the images obtained:CT head - no changes since prior MRI  Impression: 76 yo M with GBM undergiong treatment with avastin. PRES can be associated with avastin and this may be difficult to determine given the widespread T2 changes already present. There does seem to be more on the most recent study. thoguh case reports of leptomeningeal enhancement  with PRES have been made, it is not common.   He will likely need CSF sampling, though not sure if cytology is done on weekends, may be higher yield to wait until Monday.   Recommendations: 1) BP control. 2) Consider LP for cytology 3) EEG 4) Continue keppra 500mg  BID 5) Will follow.    Roland Rack, MD [facility] [phone]  If 7pm- 7am, please page neurology on call as listed in [place].

## 2016-11-02 NOTE — Progress Notes (Addendum)
Patient arrived via [facility], no family at bedside, pt oriented x1. Vitals stable. Tele applied and verified.  Neuro MD notified pt has arrived per order. Unable to complete admission at this time d/t orientation and without family present. Continue to monitor.

## 2016-11-02 NOTE — ED Provider Notes (Signed)
[facility] DEPT Provider Note   CSN: [id number] Arrival date & time: 11/02/16  1258     History   Chief Complaint Chief Complaint  Patient presents with  . Altered Mental Status    HPI Phillip Mckay is a 76 y.o. male.  Pt presents to the ED today with altered mental status.  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  The pt has been getting chemo.  Last dose 2 weeks ago.  Pt had a MRI of his brain on 1/12 which showed possible signs of progression.  On the contralateral parietal area, there is another abnormality that is of unclear etiology.  The family said pt has been confused and talking out of his head for the past 2 weeks.  It is getting worse.  The pt has also fallen multiple times.  [facility].  Family said that he has not been drinking or eating much.  History is obtained mainly from family as pt is a poor historian.  His facts are also not correct according to the family.  Pt d/w Dr. Whitney Muse.  Despite the GBM, his mental status has been totally normal until recently.  He lived at home until 2 weeks ago when the family could no longer take care of him.  She is concerned the abnormal area on the left parietal region is a focus of seizure or a cause of his abnormal mental status.  She requests that the pt get admitted and then see neurology.      Past Medical History:  Diagnosis Date  . Brain cancer ([place])   . Diabetes mellitus without complication ([place])   . Glioblastoma multiforme of brain ([place]) 10/27/2015    Patient Active Problem List   Diagnosis Date Noted  . Altered mental status 11/02/2016  . Low blood potassium 04/16/2016  . 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 Benign essential HTN 04/02/2016  . Thrombocytopenia ([place]) 03/19/2016  . Steroid withdrawal syndrome ([place]) 02/20/2016  . Hypoglycemia 02/13/2016  . 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 Candida infection of mouth  11/21/2015  . Avitaminosis D 10/29/2015  . Glioblastoma multiforme of brain ([place]) 10/27/2015  . Type 2 diabetes mellitus ([place]) 10/22/2015  . 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 Dyslipidemia 10/19/2015  . Acid reflux 10/19/2015  . Cerebral hemorrhage ([place]) 10/19/2015  . BP (high blood pressure) 10/19/2015  . Fungal infection of nail 09/14/2015  . Athlete's foot 09/14/2015  . Elevated fasting blood sugar 08/27/2015  . Carotid artery disease ([place]) 08/24/2015  . Diabetic on diet only ([place]) 08/24/2015  . Left ventricular hypertrophy 11/14/2010  . Essential (primary) hypertension 04/04/2009  . Temporary cerebral vascular dysfunction 04/04/2009  . Phlebitis of leg, superficial 03/24/2007  . Varicose veins of lower extremities with inflammation 03/24/2007  . Calculus of kidney 06/19/2006  . Generalized OA 03/08/2003  . Displacement of cervical intervertebral disc without myelopathy 02/02/2002  . Hypercholesterolemia 02/02/2002  . Herniation of intervertebral disc of cervical region 02/02/2002  . Benign neoplasm of cranial nerve ([place]) 07/03/1999    Past Surgical History:  Procedure Laterality Date  . CHOLECYSTECTOMY         Home Medications    Prior to Admission medications   Medication Sig Start Date End Date Taking? Authorizing Provider  cholecalciferol 2000 units tablet Take 1 tablet (2,000 Units total) by mouth daily. Patient taking differently: Take 3,000 Units  by mouth daily.  07/02/16  Yes Patrici Ranks, MD  clotrimazole-betamethasone (LOTRISONE) cream Apply 1 application topically 2 (two) times daily. 08/10/16  Yes Manon Hilding Kefalas, PA-C  dexamethasone (DECADRON) 4 MG tablet Take 4 mg by mouth daily.   Yes Historical Provider, MD  Docusate Sodium 100 MG capsule Take 100 mg by mouth 2 (two) times daily.  12/26/15  Yes Historical Provider, MD  gabapentin (NEURONTIN) 300 MG capsule Take 300 mg by mouth 3 (three) times daily.  Reported on 04/20/2016 02/03/16  Yes Historical Provider, MD  insulin lispro (HUMALOG) 100 UNIT/ML injection Inject 2-20 Units into the skin See admin instructions. Accu-cheks twice daily at 7am and 4pm Sliding Scale as follows: 150=2u 250=4u 350=8u 450=12u 550=20u   Yes Historical Provider, MD  KLOR-CON M20 20 MEQ tablet TAKE 2 TABLETS (40 MEQ TOTAL) BY MOUTH DAILY. Patient taking differently: Take 20 mEq by mouth daily.  10/01/16  Yes Manon Hilding Kefalas, PA-C  latanoprost (XALATAN) 0.005 % ophthalmic solution Place 1 drop into both eyes daily. Reported on 04/20/2016   Yes Historical Provider, MD  levETIRAcetam (KEPPRA) 500 MG tablet Take 500 mg by mouth 2 (two) times daily.   Yes Historical Provider, MD  lisinopril (PRINIVIL,ZESTRIL) 40 MG tablet Take 40 mg by mouth daily.  04/02/16  Yes Historical Provider, MD  LORazepam (ATIVAN) 1 MG tablet Take as needed for seizure. Take 1 mg at onset may repeat in 5 minute intervals X 3 04/20/16  Yes Patrici Ranks, MD  metFORMIN (GLUCOPHAGE) 1000 MG tablet Take 1,000 mg by mouth daily.    Yes Historical Provider, MD  omeprazole (PRILOSEC) 20 MG capsule Take 20 mg by mouth daily as needed (for GERD).  06/14/16  Yes Historical Provider, MD  ondansetron (ZOFRAN) 8 MG tablet TAKE 1 TABLET BY MOUTH EVERY 8 HOURS AS NEEDED FOR NAUSEA AND VOMITING 09/17/16  Yes Patrici Ranks, MD  Polyethyl Glycol-Propyl Glycol 0.4-0.3 % SOLN Apply 1 drop to eye daily. For dry eye relief   Yes Historical Provider, MD  Probiotic Product [facility]) CAPS Take 3 capsules by mouth daily.    Yes Historical Provider, MD  prochlorperazine (COMPAZINE) 10 MG tablet Take 1 tablet (10 mg total) by mouth every 6 (six) hours as needed for nausea or vomiting. Reported on 04/20/2016 05/24/16  Yes Manon Hilding Kefalas, PA-C  simvastatin (ZOCOR) 20 MG tablet Take 20 mg by mouth every evening. Reported on 04/20/2016 03/09/16  Yes Historical Provider, MD  tamsulosin (FLOMAX) 0.4 MG CAPS capsule Take 0.4 mg by  mouth daily.  11/26/15  Yes Historical Provider, MD  tetrahydrozoline 0.05 % ophthalmic solution Place 1 drop into both eyes daily. Reported on 04/20/2016   Yes Historical Provider, MD  traMADol (ULTRAM) 50 MG tablet Take 1 tablet (50 mg total) by mouth every 6 (six) hours as needed. 05/04/16  Yes Patrici Ranks, MD  TRAVATAN Z 0.004 % SOLN ophthalmic solution Place 1 drop into both eyes at bedtime.  07/28/16  Yes Historical Provider, MD  ziprasidone (GEODON) injection Inject 10 mg into the muscle as needed for agitation (and/or restlessness).   Yes Historical Provider, MD  Bevacizumab (AVASTIN IV) Inject into the vein. Every 2 weeks    Historical Provider, MD  pantoprazole (PROTONIX) 40 MG tablet TAKE 1 TABLET (40 MG TOTAL) BY MOUTH DAILY. 10/23/16   Patrici Ranks, MD    Family History Family History  Problem Relation Age of Onset  . Cancer Mother   . Diabetes  Mother   . Cancer Father   . Cancer Brother     Social History Social History  Substance Use Topics  . Smoking status: Former Research scientist (life sciences)  . Smokeless tobacco: Never Used  . Alcohol use No     Allergies   Patient has no known allergies.   Review of Systems Review of Systems  Allergic/Immunologic: Positive for immunocompromised state.  Neurological: Positive for speech difficulty and weakness.  All other systems reviewed and are negative.    Physical Exam Updated Vital Signs BP 151/85 (BP Location: Right Arm)   Pulse 81   Temp 97.4 F (36.3 C) (Oral)   Resp 20   Ht 5\' 6"  (1.676 m)   Wt 161 lb (73 kg)   SpO2 98%   BMI 25.99 kg/m   Physical Exam  Constitutional: He is oriented to person, place, and time. He appears well-developed and well-nourished.  HENT:  Head: Normocephalic.    Right Ear: External ear normal.  Left Ear: External ear normal.  Nose: Nose normal.  Mouth/Throat: Oropharynx is clear and moist.  Eyes: Conjunctivae and EOM are normal. Pupils are equal, round, and reactive to light.  Neck:  Normal range of motion. Neck supple.  Cardiovascular: Regular rhythm, normal heart sounds and intact distal pulses.  Tachycardia present.   Pulmonary/Chest: Effort normal and breath sounds normal.  Abdominal: Soft. Bowel sounds are normal.  Musculoskeletal: Normal range of motion.  Neurological: He is alert and oriented to person, place, and time.  Skin: Skin is warm.  Psychiatric: He has a normal mood and affect. His behavior is normal. Judgment and thought content normal.  Nursing note and vitals reviewed.    ED Treatments / Results  Labs (all labs ordered are listed, but only abnormal results are displayed) Labs Reviewed  COMPREHENSIVE METABOLIC PANEL - Abnormal; Notable for the following:       Result Value   Glucose, Bld 136 (*)    Alkaline Phosphatase 36 (*)    All other components within normal limits  CBC - Abnormal; Notable for the following:    WBC 11.7 (*)    RBC 2.89 (*)    Hemoglobin 10.0 (*)    HCT 28.7 (*)    MCH 34.6 (*)    Platelets 146 (*)    All other components within normal limits  URINALYSIS, ROUTINE W REFLEX MICROSCOPIC - Abnormal; Notable for the following:    Specific Gravity, Urine >1.030 (*)    Hgb urine dipstick TRACE (*)    Protein, ur 100 (*)    All other components within normal limits  URINALYSIS, MICROSCOPIC (REFLEX) - Abnormal; Notable for the following:    Bacteria, UA FEW (*)    Squamous Epithelial / LPF 0-5 (*)    All other components within normal limits  CBG MONITORING, ED - Abnormal; Notable for the following:    Glucose-Capillary 113 (*)    All other components within normal limits    EKG  EKG Interpretation None       Radiology Dg Chest 1 View  Result Date: 11/02/2016 CLINICAL DATA:  Mental status changes. Fall today. History of glioblastoma of brain. EXAM: CHEST 1 VIEW COMPARISON:  02/03/2016 FINDINGS: The heart size is within normal limits. There is stable ectasia of the thoracic aorta. Lung volumes are low bilaterally.  There is no evidence of pulmonary edema, consolidation, pneumothorax, nodule or pleural fluid. The visualized skeletal structures are unremarkable. IMPRESSION: Low bilateral lung volumes.  No active disease. Electronically Signed  By: Aletta Edouard M.D.   On: 11/02/2016 15:48   Ct Head Wo Contrast  Result Date: 11/02/2016 CLINICAL DATA:  Altered mental status with slurred speech and dysphagia. Recent fall. Initial encounter. EXAM: CT HEAD WITHOUT CONTRAST TECHNIQUE: Contiguous axial images were obtained from the base of the skull through the vertex without intravenous contrast. COMPARISON:  Head CT and MRI 10/26/2016 FINDINGS: Brain: There is no evidence of acute infarct, acute intracranial hemorrhage, midline shift, or sizable extra-axial fluid collection. Postsurgical changes are again seen in the right parietal lobe with a resection cavity and surrounding low-density in the white matter as well as some adjacent calcification, not significantly changed from the recent prior CT and more fully evaluated on the recent MRI. Postsurgical changes are also noted in the posterior fossa with mild encephalomalacia and gliosis in the left cerebellum. There is mild global cerebral atrophy. Confluent hypodensities in the cerebral white matter bilaterally are unchanged. Vascular: Minimal calcified atherosclerosis involving the carotid siphons. No hyperdense vessel. Skull: Prior right parietal craniotomy and left retromastoid craniectomy. Sinuses/Orbits: Trace paranasal sinus mucosal thickening. Clear mastoid air cells. Unremarkable orbits. Other: None. IMPRESSION: 1. No evidence of acute intracranial abnormality. 2. Post treatment changes for glioblastoma, more fully evaluated on recent MRI. Electronically Signed   By: Logan Bores M.D.   On: 11/02/2016 17:07    Procedures Procedures (including critical care time)  Medications Ordered in ED Medications  sodium chloride 0.9 % bolus 1,000 mL (0 mLs Intravenous  Stopped 11/02/16 1924)    And  0.9 %  sodium chloride infusion (not administered)  sodium chloride 0.9 % bolus 1,000 mL (not administered)  LORazepam (ATIVAN) injection 0.5 mg (0.5 mg Intravenous Given 11/02/16 1748)     Initial Impression / Assessment and Plan / ED Course  I have reviewed the triage vital signs and the nursing notes.  Pertinent labs & imaging results that were available during my care of the patient were reviewed by me and considered in my medical decision making (see chart for details).  Pt did become very agitated while here, so he was given ativan which did finally calm him down.  Due to the discussion with Dr. Whitney Muse, pt d/w the hospitalists for admission.  Dr. Nehemiah Settle (triad) requested that I speak with the neurologist at [facility] to see if an EEG is possible in the morning.  I spoke with Dr. Leonel Ramsay (neuro) who will see him in consult and order an EEG in the morning.  Dr. Leonel Ramsay did say that pt may be well served by going back to where he had his surgery.  The family said the surgery was done at [facility].  However, they would rather stay in the [facility] system since they have been followed by Dr. Whitney Muse.  They have not been back there in over a year.  The pt was then d/w Dr. Nehemiah Settle who said to admit to Dr. Hal Hope at [facility]  Dr. Leonel Ramsay said for them to call him when pt arrived.  CRITICAL CARE Performed by: Isla Pence   Total critical care time: 30 minutes  Critical care time was exclusive of separately billable procedures and treating other patients.  Critical care was necessary to treat or prevent imminent or life-threatening deterioration.  Critical care was time spent personally by me on the following activities: development of treatment plan with patient and/or surrogate as well as nursing, discussions with consultants, evaluation of patient's response to treatment, examination of patient, obtaining history from patient or surrogate, ordering  and performing treatments and interventions, ordering and review of laboratory studies, ordering and review of radiographic studies, pulse oximetry and re-evaluation of patient's condition.   Final Clinical Impressions(s) / ED Diagnoses   Final diagnoses:  GBM (glioblastoma multiforme) ([place])  Ambulatory dysfunction  Injury of head, initial encounter  Abnormal MRI  Altered mental status, unspecified altered mental status type    New Prescriptions New Prescriptions   No medications on file     Isla Pence, MD 11/02/16 2007

## 2016-11-02 NOTE — ED Notes (Signed)
Spoke with Phillip Mckay at [facility].  Her extension is [phone].

## 2016-11-02 NOTE — ED Notes (Signed)
Attempted to call report but nurse was busy and I was told the nurse would call me when she was ready for report. I gave secretary my direct extension.

## 2016-11-02 NOTE — ED Notes (Signed)
Unable to obtain accurate bp due to pt's agitation when bp cuff inflates.  Family is requesting medication for pt's agitation.  Dr. Gilford Raid aware.  Pt has removed his IV access and new one began.

## 2016-11-02 NOTE — ED Notes (Signed)
Olivia Mackie RN at [facility] ext [phone] called to check on pt disposition.

## 2016-11-02 NOTE — ED Notes (Signed)
Pt's wife and son very upset pt continues to be confused and agitated.  Advised he was medicated for agitation to which wife responded, "well it's not enough, he needs to be tied down or knocked out because I cannot deal with this."  Pt not trying to get out of bed, but is pulling at lines.  Is easily redirectable.  Incontinent of urine and changed at this time with full linen change.

## 2016-11-03 ENCOUNTER — Observation Stay (HOSPITAL_COMMUNITY)
Admit: 2016-11-03 | Discharge: 2016-11-03 | Disposition: A | Discharge status: Home / Self Care | Payer: Medicare Other | Attending: Neurology | Admitting: Neurology

## 2016-11-03 DIAGNOSIS — R4182 Altered mental status, unspecified: Secondary | ICD-10-CM | POA: Diagnosis present

## 2016-11-03 DIAGNOSIS — I509 Heart failure, unspecified: Secondary | ICD-10-CM | POA: Diagnosis not present

## 2016-11-03 DIAGNOSIS — R938 Abnormal findings on diagnostic imaging of other specified body structures: Secondary | ICD-10-CM | POA: Diagnosis not present

## 2016-11-03 DIAGNOSIS — Z951 Presence of aortocoronary bypass graft: Secondary | ICD-10-CM | POA: Diagnosis not present

## 2016-11-03 DIAGNOSIS — R748 Abnormal levels of other serum enzymes: Secondary | ICD-10-CM | POA: Diagnosis not present

## 2016-11-03 DIAGNOSIS — Z79899 Other long term (current) drug therapy: Secondary | ICD-10-CM | POA: Diagnosis not present

## 2016-11-03 DIAGNOSIS — Z833 Family history of diabetes mellitus: Secondary | ICD-10-CM | POA: Diagnosis not present

## 2016-11-03 DIAGNOSIS — G92 Toxic encephalopathy: Secondary | ICD-10-CM | POA: Diagnosis present

## 2016-11-03 DIAGNOSIS — I110 Hypertensive heart disease with heart failure: Secondary | ICD-10-CM | POA: Diagnosis present

## 2016-11-03 DIAGNOSIS — Z66 Do not resuscitate: Secondary | ICD-10-CM | POA: Diagnosis present

## 2016-11-03 DIAGNOSIS — J690 Pneumonitis due to inhalation of food and vomit: Secondary | ICD-10-CM | POA: Diagnosis present

## 2016-11-03 DIAGNOSIS — G8194 Hemiplegia, unspecified affecting left nondominant side: Secondary | ICD-10-CM | POA: Diagnosis present

## 2016-11-03 DIAGNOSIS — J9601 Acute respiratory failure with hypoxia: Secondary | ICD-10-CM | POA: Diagnosis present

## 2016-11-03 DIAGNOSIS — T451X5A Adverse effect of antineoplastic and immunosuppressive drugs, initial encounter: Secondary | ICD-10-CM | POA: Diagnosis present

## 2016-11-03 DIAGNOSIS — R7989 Other specified abnormal findings of blood chemistry: Secondary | ICD-10-CM | POA: Diagnosis not present

## 2016-11-03 DIAGNOSIS — C7931 Secondary malignant neoplasm of brain: Secondary | ICD-10-CM | POA: Diagnosis present

## 2016-11-03 DIAGNOSIS — E119 Type 2 diabetes mellitus without complications: Secondary | ICD-10-CM | POA: Diagnosis present

## 2016-11-03 DIAGNOSIS — Z794 Long term (current) use of insulin: Secondary | ICD-10-CM | POA: Diagnosis not present

## 2016-11-03 DIAGNOSIS — I10 Essential (primary) hypertension: Secondary | ICD-10-CM | POA: Diagnosis not present

## 2016-11-03 DIAGNOSIS — K219 Gastro-esophageal reflux disease without esophagitis: Secondary | ICD-10-CM | POA: Diagnosis present

## 2016-11-03 DIAGNOSIS — I5023 Acute on chronic systolic (congestive) heart failure: Secondary | ICD-10-CM | POA: Diagnosis present

## 2016-11-03 DIAGNOSIS — Z87891 Personal history of nicotine dependence: Secondary | ICD-10-CM | POA: Diagnosis not present

## 2016-11-03 DIAGNOSIS — Z923 Personal history of irradiation: Secondary | ICD-10-CM | POA: Diagnosis not present

## 2016-11-03 DIAGNOSIS — G9340 Encephalopathy, unspecified: Secondary | ICD-10-CM | POA: Diagnosis not present

## 2016-11-03 LAB — GLUCOSE, CAPILLARY
GLUCOSE-CAPILLARY: 87 mg/dL (ref 65–99)
Glucose-Capillary: 82 mg/dL (ref 65–99)
Glucose-Capillary: 89 mg/dL (ref 65–99)
Glucose-Capillary: 101 mg/dL — ABNORMAL HIGH (ref 65–99)

## 2016-11-03 LAB — BASIC METABOLIC PANEL
ANION GAP: 7 (ref 5–15)
BUN: 14 mg/dL (ref 6–20)
CALCIUM: 8.8 mg/dL — AB (ref 8.9–10.3)
CO2: 24 mmol/L (ref 22–32)
CREATININE: 0.89 mg/dL (ref 0.61–1.24)
Chloride: 114 mmol/L — ABNORMAL HIGH (ref 101–111)
Glucose, Bld: 116 mg/dL — ABNORMAL HIGH (ref 65–99)
Potassium: 3.7 mmol/L (ref 3.5–5.1)
SODIUM: 145 mmol/L (ref 135–145)

## 2016-11-03 LAB — CBC
HCT: 27.4 % — ABNORMAL LOW (ref 39.0–52.0)
HEMOGLOBIN: 9.3 g/dL — AB (ref 13.0–17.0)
MCH: 33.7 pg (ref 26.0–34.0)
MCHC: 33.9 g/dL (ref 30.0–36.0)
MCV: 99.3 fL (ref 78.0–100.0)
PLATELETS: 124 10*3/uL — AB (ref 150–400)
RBC: 2.76 MIL/uL — AB (ref 4.22–5.81)
RDW: 14.8 % (ref 11.5–15.5)
WBC: 9.8 10*3/uL (ref 4.0–10.5)

## 2016-11-03 LAB — SEDIMENTATION RATE: SED RATE: 33 mm/h — AB (ref 0–16)

## 2016-11-03 LAB — VITAMIN B12: VITAMIN B 12: 283 pg/mL (ref 180–914)

## 2016-11-03 LAB — TSH: TSH: 0.814 u[IU]/mL (ref 0.350–4.500)

## 2016-11-03 MED ORDER — LORAZEPAM 2 MG/ML IJ SOLN
INTRAMUSCULAR | Status: AC
  Filled 2016-11-03: qty 1

## 2016-11-03 MED ORDER — LORAZEPAM 2 MG/ML IJ SOLN
0.5000 mg | Freq: Once | INTRAMUSCULAR | Status: AC
  Administered 2016-11-03: 0.5 mg via INTRAVENOUS

## 2016-11-03 NOTE — Progress Notes (Signed)
PROGRESS NOTE  Phillip Mckay  [id number] DOB: March 17, 1941  DOA: 11/02/2016 PCP: [name], MD   Brief Narrative:   Phillip Mckay is a 76 y.o. male with a complicated medical history including but not limited to right glioblastoma multiforme,  grade 4 with partial resection in 10/25/2015,  with 80% of the tumor followed by Temodar s/p radiation,  with transition to Avastin on 09/20/16 as well as seizure disorder on Keppra followed by Keppra-induced thrombocytopenia with discontinuation of the Keppra on 04/27/16.  He presented with progressively worsening altered mental status changes confusion and agitation with falls.  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 abscess) despite negative blood cultures with supportive care. His symptoms was said to have improved and was eventually discharged 10/30/2016 to Nursing Home.  However back at the nursing home he is said to have continued to have intermittent confusion with agitation and visual hallucinations as well as gait instability, and is said to have had several falls at the nursing home, hitting his head on the day of admission. He has some intermittent treatment with ativan.A CT of the head was negative and had mild leukocytosis of 11.7 K  He was transferred to [facility] from [facility]  for consideration of EEG.  Assessment & Plan:   Principal Problem:   Altered mental status Active Problems:   Diabetes mellitus ([place])   Essential (primary) hypertension   Acid reflux   Glioblastoma multiforme of brain ([place])  1. Altered mental status: Etiology unclear. Differential diagnosis of toxic metabolic, medication related. EEG normal. Continue fall precautions with sitter. Neurology. No  antibiotic indicated at this time. Continue Her with Decadron.  2. Right glioblastoma multiforme of brain: Continue Decadron with Keppra.  3. Falls: Likely related to problem list #1 and 2.  Supportive care with fall precaution  4. Diabetes mellitus: CBGs with close follow-up on a steroid.  DVT prophylaxis: TED Code Status: DO NOT RESUSCITATE Family Communication:  Disposition Plan: Nursing home   Consultants:  Neurology  Procedures:  EEG 11/03/2016 - Normal EEG in the awake state  Antimicrobials:  None  Subjective: Confusion and falls. No acute events reported overnight. Sitter at bedside. Objective:  Vitals:   11/02/16 2320 11/03/16 0652 11/03/16 1032 11/03/16 1700  BP: (!) 159/97 (!) 144/95 (!) 161/92 (!) 155/98  Pulse: 94 (!) 104 (!) 110 (!) 103  Resp: 20 18 19 20   Temp: 97.5 F (36.4 C) 97.8 F (36.6 C) 98.3 F (36.8 C) 97.8 F (36.6 C)  TempSrc: Oral Oral Oral Oral  SpO2: 98% 95% 94% 97%  Weight: 75.1 kg (165 lb 9.6 oz)     Height: 5\' 7"  (1.702 m)       Intake/Output Summary (Last 24 hours) at 11/03/16 1736 Last data filed at 11/03/16 1623  Gross per 24 hour  Intake          1554.58 ml  Output              800 ml  Net           754.58 ml   Filed Weights   11/02/16 1410 11/02/16 2320  Weight: 73 kg (161 lb) 75.1 kg (165 lb 9.6 oz)    Examination:  General exam: Resting comfortably. Respiratory system: Clear to auscultation. Respiratory  effort normal. Cardiovascular system: S1 & S2 heard, RRR. No JVD, murmurs, rubs, gallops or clicks. No pedal edema. Gastrointestinal system: Abdomen is nondistended, soft and nontender. No organomegaly or masses felt. Normal bowel sounds heard. Central nervous system: Pleasantly confused. Alert and oriented 1. Extremities: Symmetric 5 x 5 power.  Psychiatry: Judgement and insight appear normal. Mood & affect appropriate.     Data Reviewed: I have personally reviewed following labs and imaging  studies  CBC:  Recent Labs Lab 11/02/16 1507 11/03/16 0003  WBC 11.7* 9.8  HGB 10.0* 9.3*  HCT 28.7* 27.4*  MCV 99.3 99.3  PLT 146* [id number]*   Basic Metabolic Panel:  Recent Labs Lab 11/02/16 1507 11/03/16 0003  NA 145 145  K 4.5 3.7  CL 111 114*  CO2 24 24  GLUCOSE 136* 116*  BUN 19 14  CREATININE 0.97 0.89  CALCIUM 9.4 8.8*   GFR: Estimated Creatinine Clearance: 66 mL/min (by C-G formula based on SCr of 0.89 mg/dL). Liver Function Tests:  Recent Labs Lab 11/02/16 1507  AST 25  ALT 27  ALKPHOS 36*  BILITOT 1.1  PROT 6.5  ALBUMIN 3.9   No results for input(s): LIPASE, AMYLASE in the last 168 hours. No results for input(s): AMMONIA in the last 168 hours. Coagulation Profile: No results for input(s): INR, PROTIME in the last 168 hours. Cardiac Enzymes: No results for input(s): CKTOTAL, CKMB, CKMBINDEX, TROPONINI in the last 168 hours. BNP (last 3 results) No results for input(s): PROBNP in the last 8760 hours. HbA1C: No results for input(s): HGBA1C in the last 72 hours. CBG:  Recent Labs Lab 11/02/16 1527 11/02/16 2315 11/03/16 0650 11/03/16 1104 11/03/16 1606  GLUCAP 113* 112* 82 89 101*   Lipid Profile: No results for input(s): CHOL, HDL, LDLCALC, TRIG, CHOLHDL, LDLDIRECT in the last 72 hours. Thyroid Function Tests:  Recent Labs  11/03/16 0003  TSH 0.814   Anemia Panel:  Recent Labs  11/03/16 0003  VITAMINB12 283    Sepsis Labs:  Recent Labs Lab 11/02/16 1507 11/03/16 0003  WBC 11.7* 9.8    No results found for this or any previous visit (from the past 240 hour(s)).       Radiology Studies: Dg Chest 1 View  Result Date: 11/02/2016 CLINICAL DATA:  Mental status changes. Fall today. History of glioblastoma of brain. EXAM: CHEST 1 VIEW COMPARISON:  02/03/2016 FINDINGS: The heart size is within normal limits. There is stable ectasia of the thoracic aorta. Lung volumes are low bilaterally. There is no evidence of pulmonary  edema, consolidation, pneumothorax, nodule or pleural fluid. The visualized skeletal structures are unremarkable. IMPRESSION: Low bilateral lung volumes.  No active disease. Electronically Signed   By: Aletta Edouard M.D.   On: 11/02/2016 15:48   Ct Head Wo Contrast  Result Date: 11/02/2016 CLINICAL DATA:  Altered mental status with slurred speech and dysphagia. Recent fall. Initial encounter. EXAM: CT HEAD WITHOUT CONTRAST TECHNIQUE: Contiguous axial images were obtained from the base of the skull through the vertex without intravenous contrast. COMPARISON:  Head CT and MRI 10/26/2016 FINDINGS: Brain: There is no evidence of acute infarct, acute intracranial hemorrhage, midline shift, or sizable extra-axial fluid collection. Postsurgical changes are again seen in the right parietal lobe with a resection cavity and surrounding low-density in the white matter as well as some adjacent calcification, not significantly changed from the recent prior CT and more fully evaluated on the recent MRI. Postsurgical changes are also noted in the posterior fossa with  mild encephalomalacia and gliosis in the left cerebellum. There is mild global cerebral atrophy. Confluent hypodensities in the cerebral white matter bilaterally are unchanged. Vascular: Minimal calcified atherosclerosis involving the carotid siphons. No hyperdense vessel. Skull: Prior right parietal craniotomy and left retromastoid craniectomy. Sinuses/Orbits: Trace paranasal sinus mucosal thickening. Clear mastoid air cells. Unremarkable orbits. Other: None. IMPRESSION: 1. No evidence of acute intracranial abnormality. 2. Post treatment changes for glioblastoma, more fully evaluated on recent MRI. Electronically Signed   By: Logan Bores M.D.   On: 11/02/2016 17:07        Scheduled Meds: . dexamethasone  4 mg Oral Q breakfast  . gabapentin  300 mg Oral TID  . insulin aspart  0-20 Units Subcutaneous TID WC  . insulin aspart  0-5 Units Subcutaneous  QHS  . levETIRAcetam  500 mg Oral BID  . metFORMIN  1,000 mg Oral Q breakfast  . pantoprazole (PROTONIX) IV  40 mg Intravenous Daily  . simvastatin  20 mg Oral QPM  . sodium chloride flush  3 mL Intravenous Q12H  . tamsulosin  0.4 mg Oral Daily   Continuous Infusions: . sodium chloride 125 mL/hr at 11/02/16 2353     LOS: 0 days    Time spent: [place], MD [facility] Pager [phone]  If 7PM-7AM, please contact night-coverage [email or web address] Password [facility] 11/03/2016, 5:36 PM

## 2016-11-03 NOTE — Progress Notes (Signed)
EEG Completed; Results Pending  

## 2016-11-03 NOTE — Progress Notes (Signed)
Subjective: Interval History: Feels better, no visual hallucinations, no confusion.  Objective: Vital signs in last 24 hours: Temp:  [97.4 F (36.3 C)-97.8 F (36.6 C)] 97.8 F (36.6 C) (01/20 0652) Pulse Rate:  [81-115] 104 (01/20 0652) Resp:  [14-23] 18 (01/20 0652) BP: (144-174)/(85-113) 144/95 (01/20 0652) SpO2:  [95 %-100 %] 95 % (01/20 0652) Weight:  [73 kg (161 lb)-75.1 kg (165 lb 9.6 oz)] 75.1 kg (165 lb 9.6 oz) (01/19 2320)  Intake/Output from previous day: 01/19 0701 - 01/20 0700 In: 1514.6 [I.V.:514.6; IV Piggyback:1000] Out: 350 [Urine:350] Intake/Output this shift: No intake/output data recorded. Nutritional status: Diet NPO time specified Except for: Sips with Meds  Neurologic Exam: Awake, alert Fully oriented. Left HHA Left side 4/5 vs 5/5 right   Lab Results:  Recent Labs  11/02/16 1507 11/03/16 0003  WBC 11.7* 9.8  HGB 10.0* 9.3*  HCT 28.7* 27.4*  PLT 146* 124*  NA 145 145  K 4.5 3.7  CL 111 114*  CO2 24 24  GLUCOSE 136* 116*  BUN 19 14  CREATININE 0.97 0.89  CALCIUM 9.4 8.8*   Lipid Panel No results for input(s): CHOL, TRIG, HDL, CHOLHDL, VLDL, LDLCALC in the last 72 hours.  Studies/Results: Dg Chest 1 View  Result Date: 11/02/2016 CLINICAL DATA:  Mental status changes. Fall today. History of glioblastoma of brain. EXAM: CHEST 1 VIEW COMPARISON:  02/03/2016 FINDINGS: The heart size is within normal limits. There is stable ectasia of the thoracic aorta. Lung volumes are low bilaterally. There is no evidence of pulmonary edema, consolidation, pneumothorax, nodule or pleural fluid. The visualized skeletal structures are unremarkable. IMPRESSION: Low bilateral lung volumes.  No active disease. Electronically Signed   By: Aletta Edouard M.D.   On: 11/02/2016 15:48   Ct Head Wo Contrast  Result Date: 11/02/2016 CLINICAL DATA:  Altered mental status with slurred speech and dysphagia. Recent fall. Initial encounter. EXAM: CT HEAD WITHOUT CONTRAST  TECHNIQUE: Contiguous axial images were obtained from the base of the skull through the vertex without intravenous contrast. COMPARISON:  Head CT and MRI 10/26/2016 FINDINGS: Brain: There is no evidence of acute infarct, acute intracranial hemorrhage, midline shift, or sizable extra-axial fluid collection. Postsurgical changes are again seen in the right parietal lobe with a resection cavity and surrounding low-density in the white matter as well as some adjacent calcification, not significantly changed from the recent prior CT and more fully evaluated on the recent MRI. Postsurgical changes are also noted in the posterior fossa with mild encephalomalacia and gliosis in the left cerebellum. There is mild global cerebral atrophy. Confluent hypodensities in the cerebral white matter bilaterally are unchanged. Vascular: Minimal calcified atherosclerosis involving the carotid siphons. No hyperdense vessel. Skull: Prior right parietal craniotomy and left retromastoid craniectomy. Sinuses/Orbits: Trace paranasal sinus mucosal thickening. Clear mastoid air cells. Unremarkable orbits. Other: None. IMPRESSION: 1. No evidence of acute intracranial abnormality. 2. Post treatment changes for glioblastoma, more fully evaluated on recent MRI. Electronically Signed   By: Logan Bores M.D.   On: 11/02/2016 17:07    Medications:  Scheduled: . dexamethasone  4 mg Oral Q breakfast  . gabapentin  300 mg Oral TID  . insulin aspart  0-20 Units Subcutaneous TID WC  . insulin aspart  0-5 Units Subcutaneous QHS  . levETIRAcetam  500 mg Oral BID  . metFORMIN  1,000 mg Oral Q breakfast  . pantoprazole (PROTONIX) IV  40 mg Intravenous Daily  . simvastatin  20 mg Oral QPM  .  sodium chloride flush  3 mL Intravenous Q12H  . 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  EEG is pending.  Symptoms should subside and imaging abnormalities should  reverse over time with cessation of Avastin.     LOS: 0 days   Rogue Jury, MD 11/03/2016  9:27 AM

## 2016-11-03 NOTE — Procedures (Signed)
EEG Report  Clinical History:  Right side GBM admitted with confusion and hallucinations on Avastin  EEG description:    Posterior dominant rhythm of 8 Hz reactive to eye opening and closure.    No focal slowing.  No epileptiform discharges or electrographic seizures.    No sleep was recorded.  Impression:  Normal EEG in the awake state.     Rogue Jury, MD

## 2016-11-03 NOTE — Progress Notes (Addendum)
Patient attempting to get out of bed, unable to redirect, agitated. On call paged for orders [phone]. Second page [phone]. Paged [phone].  Orders placed for safety sitter and bilateral wrist restraints. Continue to monitor patient.

## 2016-11-04 ENCOUNTER — Other Ambulatory Visit: Payer: Self-pay

## 2016-11-04 ENCOUNTER — Inpatient Hospital Stay (HOSPITAL_COMMUNITY): Payer: Medicare Other

## 2016-11-04 DIAGNOSIS — R4182 Altered mental status, unspecified: Secondary | ICD-10-CM

## 2016-11-04 LAB — GLUCOSE, CAPILLARY
GLUCOSE-CAPILLARY: 120 mg/dL — AB (ref 65–99)
GLUCOSE-CAPILLARY: 163 mg/dL — AB (ref 65–99)
Glucose-Capillary: 76 mg/dL (ref 65–99)
Glucose-Capillary: 239 mg/dL — ABNORMAL HIGH (ref 65–99)

## 2016-11-04 LAB — TROPONIN I: Troponin I: 0.18 ng/mL (ref <0.03)

## 2016-11-04 LAB — BRAIN NATRIURETIC PEPTIDE: B Natriuretic Peptide: 2245.3 pg/mL — ABNORMAL HIGH (ref 0.0–100.0)

## 2016-11-04 MED ORDER — LORAZEPAM 2 MG/ML IJ SOLN
1.0000 mg | Freq: Four times a day (QID) | INTRAMUSCULAR | Status: DC | PRN
Start: 2016-11-04 — End: 2016-11-08

## 2016-11-04 MED ORDER — PANTOPRAZOLE SODIUM 40 MG PO TBEC
40.0000 mg | DELAYED_RELEASE_TABLET | Freq: Every day | ORAL | Status: DC
  Administered 2016-11-04 – 2016-11-07 (×4): 40 mg via ORAL
  Filled 2016-11-04 (×4): qty 1

## 2016-11-04 NOTE — Progress Notes (Signed)
Subjective: Interval History: No seizures.  No hallucinations.  Advancing diet.    Objective: Vital signs in last 24 hours: Temp:  [97.8 F (36.6 C)-99 F (37.2 C)] 98.2 F (36.8 C) (01/21 0957) Pulse Rate:  [92-126] 126 (01/21 0957) Resp:  [16-20] 18 (01/21 0957) BP: (135-161)/(88-98) 148/98 (01/21 0957) SpO2:  [94 %-100 %] 100 % (01/21 0957)  Intake/Output from previous day: 01/20 0701 - 01/21 0700 In: 1750 [I.V.:1750] Out: 650 [Urine:650] Intake/Output this shift: No intake/output data recorded. Nutritional status: Diet regular Room service appropriate? Yes; Fluid consistency: Thin  Neurologic Exam:  Lab Results:  Recent Labs  11/02/16 1507 11/03/16 0003  WBC 11.7* 9.8  HGB 10.0* 9.3*  HCT 28.7* 27.4*  PLT 146* 124*  NA 145 145  K 4.5 3.7  CL 111 114*  CO2 24 24  GLUCOSE 136* 116*  BUN 19 14  CREATININE 0.97 0.89  CALCIUM 9.4 8.8*   Lipid Panel No results for input(s): CHOL, TRIG, HDL, CHOLHDL, VLDL, LDLCALC in the last 72 hours.  Studies/Results: Dg Chest 1 View  Result Date: 11/02/2016 CLINICAL DATA:  Mental status changes. Fall today. History of glioblastoma of brain. EXAM: CHEST 1 VIEW COMPARISON:  02/03/2016 FINDINGS: The heart size is within normal limits. There is stable ectasia of the thoracic aorta. Lung volumes are low bilaterally. There is no evidence of pulmonary edema, consolidation, pneumothorax, nodule or pleural fluid. The visualized skeletal structures are unremarkable. IMPRESSION: Low bilateral lung volumes.  No active disease. Electronically Signed   By: Aletta Edouard M.D.   On: 11/02/2016 15:48   Ct Head Wo Contrast  Result Date: 11/02/2016 CLINICAL DATA:  Altered mental status with slurred speech and dysphagia. Recent fall. Initial encounter. EXAM: CT HEAD WITHOUT CONTRAST TECHNIQUE: Contiguous axial images were obtained from the base of the skull through the vertex without intravenous contrast. COMPARISON:  Head CT and MRI 10/26/2016  FINDINGS: Brain: There is no evidence of acute infarct, acute intracranial hemorrhage, midline shift, or sizable extra-axial fluid collection. Postsurgical changes are again seen in the right parietal lobe with a resection cavity and surrounding low-density in the white matter as well as some adjacent calcification, not significantly changed from the recent prior CT and more fully evaluated on the recent MRI. Postsurgical changes are also noted in the posterior fossa with mild encephalomalacia and gliosis in the left cerebellum. There is mild global cerebral atrophy. Confluent hypodensities in the cerebral white matter bilaterally are unchanged. Vascular: Minimal calcified atherosclerosis involving the carotid siphons. No hyperdense vessel. Skull: Prior right parietal craniotomy and left retromastoid craniectomy. Sinuses/Orbits: Trace paranasal sinus mucosal thickening. Clear mastoid air cells. Unremarkable orbits. Other: None. IMPRESSION: 1. No evidence of acute intracranial abnormality. 2. Post treatment changes for glioblastoma, more fully evaluated on recent MRI. Electronically Signed   By: Logan Bores M.D.   On: 11/02/2016 17:07   Dg Chest Port 1 View  Result Date: 11/04/2016 CLINICAL DATA:  Tachycardia and shortness of breath beginning last night. Chest pain. EXAM: PORTABLE CHEST 1 VIEW COMPARISON:  11/02/2016. FINDINGS: Heart size is at the upper limits of normal. There is some tortuosity of the aorta. There is pulmonary venous hypertension with early pulmonary edema. No consolidation or collapse. No visible effusion. 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 Electronically Signed   By: Nelson Chimes M.D.   On: 11/04/2016 09:37    Medications:  Scheduled: . dexamethasone  4 mg Oral Q breakfast  . gabapentin  300 mg Oral TID  .  insulin aspart  0-20 Units Subcutaneous TID WC  . insulin aspart  0-5 Units Subcutaneous QHS  . levETIRAcetam  500 mg Oral BID  . metFORMIN   1,000 mg Oral Q breakfast  . pantoprazole (PROTONIX) IV  40 mg Intravenous Daily  . simvastatin  20 mg Oral QPM  . sodium chloride flush  3 mL Intravenous Q12H  . 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  He is also prone to seizures due to encephalomalacia from tumor resection, but well controlled on AED.    Increase PT/OT.  Advance diet.   LOS: 1 day   Rogue Jury, MD 11/04/2016  10:19 AM

## 2016-11-04 NOTE — Progress Notes (Signed)
Patient awake continues being very impulsive attempting to get out of bed unassisted. Safety sitter at bedside.

## 2016-11-04 NOTE — Progress Notes (Signed)
CRITICAL VALUE ALERT  Critical value received:  1918  Date of notification:  11/04/16  Time of notification:  1918 Critical value read back:Yes  Nurse who received alert:  Alexah Kivett  MD notified (1st page):  Dr. Bernell List  Time of first page: 1918  MD notified (2nd page):NA  Time of second page:NA   Responding MD:  NA  Time MD responded:  NA

## 2016-11-04 NOTE — Progress Notes (Signed)
PROGRESS NOTE  Phillip Mckay  [id number] DOB: 28-Oct-1940  DOA: 11/02/2016 PCP: [name], MD   Brief Narrative:   Phillip Mckay is a 76 y.o. male with a complicated medical history including but not limited to right glioblastoma multiforme,  grade 4 with partial resection in 10/25/2015,  with 80% of the tumor followed by Temodar s/p radiation,  with transition to Avastin on 09/20/16 as well as seizure disorder on Keppra followed by Keppra-induced thrombocytopenia with discontinuation of the Keppra on 04/27/16.  He presented with progressively worsening altered mental status changes confusion and agitation with falls.  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 abscess) despite negative blood cultures with supportive care. His symptoms was said to have improved and was eventually discharged 10/30/2016 to Nursing Home.  However back at the nursing home he is said to have continued to have intermittent confusion with agitation and visual hallucinations as well as gait instability, and is said to have had several falls at the nursing home, hitting his head on the day of admission. He has some intermittent treatment with ativan.A CT of the head was negative and had mild leukocytosis of 11.7 K  He was transferred to [facility] from [facility]  for consideration of EEG.  Assessment & Plan:   Principal Problem:   Altered mental status Active Problems:   Diabetes mellitus ([place])   Essential (primary) hypertension   Acid reflux   Glioblastoma multiforme of brain ([place])  1. Altered mental status: Improved significantly overnight. Felt to be medication related - Avastin EEG normal. Sz Seizure Obs Supportive Care. PT/OT review   2. Right  glioblastoma multiforme of brain: Stable.  3. Falls: Likely related to problem list #1 and 2.  Supportive care with fall precaution. PT/OT review.  4. Spell of transient Desaturation: Chest x-ray indicated mild edema 2-lead EKG indicated mild sinus tachycardia  Without any acute ST-T wave changes. Check BNP and  Cardiac enzymes Current demand ischemia  4. Diabetes mellitus: CBGs with close follow-up on a steroid.  DVT prophylaxis: TED Code Status: DO NOT RESUSCITATE Family Communication:  Disposition Plan: Nursing home   Consultants:  Neurology  Procedures:  EEG 11/03/2016 - Normal EEG in the awake state  Antimicrobials:  None  Subjective: Patient was seen on rounds today with his daughter and son as well as his wife at bedside.  They had multiple questions all of which were appropriately and satisfactorily answered. Had episode of desaturation overnight.The patient was seen by speech and currently tolerating orals significantly. Objective:  Vitals:   11/03/16 2202 11/04/16 0215 11/04/16 0616 11/04/16 0957  BP: (!) 151/89 (!) 157/95 135/88 (!) 148/98  Pulse: 98 92 (!) 105 (!) 126  Resp: 18 16 18 18   Temp: 99 F (37.2 C) 98.2 F (36.8 C) 98.4 F (36.9 C) 98.2 F (36.8 C)  TempSrc: Axillary Oral Oral Oral  SpO2: 96% 97% 97% 100%  Weight:      Height:        Intake/Output Summary (Last 24 hours) at 11/04/16 1212 Last data filed at 11/04/16 0100  Gross per 24 hour  Intake             1750 ml  Output  650 ml  Net             1100 ml   Filed Weights   11/02/16 1410 11/02/16 2320  Weight: 73 kg (161 lb) 75.1 kg (165 lb 9.6 oz)    Examination:  General exam: Resting comfortably.Very interactive and responsive today, normal combativeness. Respiratory system: Clear to auscultation. Respiratory effort normal. Cardiovascular system: S1 & S2 heard, RRR. No JVD, murmurs, rubs, gallops or clicks. No pedal edema. Gastrointestinal system: Abdomen is  nondistended, soft and nontender. No organomegaly or masses felt. Normal bowel sounds heard. Central nervous system:  Alert and oriented 2. Extremities: Symmetric 5 x 5 power.  Psychiatry: Judgement and insight appear normal. Mood & affect appropriate.     Data Reviewed: I have personally reviewed following labs and imaging studies  CBC:  Recent Labs Lab 11/02/16 1507 11/03/16 0003  WBC 11.7* 9.8  HGB 10.0* 9.3*  HCT 28.7* 27.4*  MCV 99.3 99.3  PLT 146* [id number]*   Basic Metabolic Panel:  Recent Labs Lab 11/02/16 1507 11/03/16 0003  NA 145 145  K 4.5 3.7  CL 111 114*  CO2 24 24  GLUCOSE 136* 116*  BUN 19 14  CREATININE 0.97 0.89  CALCIUM 9.4 8.8*   GFR: Estimated Creatinine Clearance: 66 mL/min (by C-G formula based on SCr of 0.89 mg/dL). Liver Function Tests:  Recent Labs Lab 11/02/16 1507  AST 25  ALT 27  ALKPHOS 36*  BILITOT 1.1  PROT 6.5  ALBUMIN 3.9   No results for input(s): LIPASE, AMYLASE in the last 168 hours. No results for input(s): AMMONIA in the last 168 hours. Coagulation Profile: No results for input(s): INR, PROTIME in the last 168 hours. Cardiac Enzymes: No results for input(s): CKTOTAL, CKMB, CKMBINDEX, TROPONINI in the last 168 hours. BNP (last 3 results) No results for input(s): PROBNP in the last 8760 hours. HbA1C: No results for input(s): HGBA1C in the last 72 hours. CBG:  Recent Labs Lab 11/03/16 1104 11/03/16 1606 11/03/16 2156 11/04/16 0622 11/04/16 1149  GLUCAP 89 101* 87 76 120*   Lipid Profile: No results for input(s): CHOL, HDL, LDLCALC, TRIG, CHOLHDL, LDLDIRECT in the last 72 hours. Thyroid Function Tests:  Recent Labs  11/03/16 0003  TSH 0.814   Anemia Panel:  Recent Labs  11/03/16 0003  VITAMINB12 283    Sepsis Labs:  Recent Labs Lab 11/02/16 1507 11/03/16 0003  WBC 11.7* 9.8    No results found for this or any previous visit (from the past 240 hour(s)).       Radiology Studies: Dg  Chest 1 View  Result Date: 11/02/2016 CLINICAL DATA:  Mental status changes. Fall today. History of glioblastoma of brain. EXAM: CHEST 1 VIEW COMPARISON:  02/03/2016 FINDINGS: The heart size is within normal limits. There is stable ectasia of the thoracic aorta. Lung volumes are low bilaterally. There is no evidence of pulmonary edema, consolidation, pneumothorax, nodule or pleural fluid. The visualized skeletal structures are unremarkable. IMPRESSION: Low bilateral lung volumes.  No active disease. Electronically Signed   By: Aletta Edouard M.D.   On: 11/02/2016 15:48   Ct Head Wo Contrast  Result Date: 11/02/2016 CLINICAL DATA:  Altered mental status with slurred speech and dysphagia. Recent fall. Initial encounter. EXAM: CT HEAD WITHOUT CONTRAST TECHNIQUE: Contiguous axial images were obtained from the base of the skull through the vertex without intravenous contrast. COMPARISON:  Head CT and MRI 10/26/2016 FINDINGS: Brain: There is no evidence of acute infarct, acute intracranial hemorrhage,  midline shift, or sizable extra-axial fluid collection. Postsurgical changes are again seen in the right parietal lobe with a resection cavity and surrounding low-density in the white matter as well as some adjacent calcification, not significantly changed from the recent prior CT and more fully evaluated on the recent MRI. Postsurgical changes are also noted in the posterior fossa with mild encephalomalacia and gliosis in the left cerebellum. There is mild global cerebral atrophy. Confluent hypodensities in the cerebral white matter bilaterally are unchanged. Vascular: Minimal calcified atherosclerosis involving the carotid siphons. No hyperdense vessel. Skull: Prior right parietal craniotomy and left retromastoid craniectomy. Sinuses/Orbits: Trace paranasal sinus mucosal thickening. Clear mastoid air cells. Unremarkable orbits. Other: None. IMPRESSION: 1. No evidence of acute intracranial abnormality. 2. Post  treatment changes for glioblastoma, more fully evaluated on recent MRI. Electronically Signed   By: Logan Bores M.D.   On: 11/02/2016 17:07   Dg Chest Port 1 View  Result Date: 11/04/2016 CLINICAL DATA:  Tachycardia and shortness of breath beginning last night. Chest pain. EXAM: PORTABLE CHEST 1 VIEW COMPARISON:  11/02/2016. FINDINGS: Heart size is at the upper limits of normal. There is some tortuosity of the aorta. There is pulmonary venous hypertension with early pulmonary edema. No consolidation or collapse. No visible effusion. 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 Electronically Signed   By: Nelson Chimes M.D.   On: 11/04/2016 09:37        Scheduled Meds: . dexamethasone  4 mg Oral Q breakfast  . gabapentin  300 mg Oral TID  . insulin aspart  0-20 Units Subcutaneous TID WC  . insulin aspart  0-5 Units Subcutaneous QHS  . levETIRAcetam  500 mg Oral BID  . metFORMIN  1,000 mg Oral Q breakfast  . pantoprazole (PROTONIX) IV  40 mg Intravenous Daily  . simvastatin  20 mg Oral QPM  . sodium chloride flush  3 mL Intravenous Q12H  . tamsulosin  0.4 mg Oral Daily   Continuous Infusions: . sodium chloride 75 mL/hr at 11/04/16 1000     LOS: 1 day    Time spent: [place], MD [facility] Pager [phone]  If 7PM-7AM, please contact night-coverage [email or web address] Password [email or web address] 11/04/2016, 12:12 PM

## 2016-11-04 NOTE — Evaluation (Signed)
Clinical/Bedside Swallow Evaluation Patient Details  Name: Phillip Mckay MRN: [id number] Date of Birth: 1940-11-18  Today's Date: 11/04/2016 Time: SLP Start Time (ACUTE ONLY): 0830 SLP Stop Time (ACUTE ONLY): 0851 SLP Time Calculation (min) (ACUTE ONLY): 21 min  Past Medical History:  Past Medical History:  Diagnosis Date  . Brain cancer ([place])   . Diabetes mellitus without complication ([place])   . Glioblastoma multiforme of brain ([place]) 10/27/2015   Past Surgical History:  Past Surgical History:  Procedure Laterality Date  . CHOLECYSTECTOMY     HPI:  76 y.o.malewith a history of glioblastoma who presents with altered mental status that started earlier this week. History from the patient is limited because he is confused. Due to concern for recurrence, he has been transitioned to Avastin which was started on 12/7. He has a known history of seizures and has been on Keppra, but developed Keppra induced thrombocytopenia and therefore this was stopped. He has been confused and having visual hallucinations and was admitted to [facility]. 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 It started on Keppra and Decadron. He was also started on vancomycin and Rocephin. He apparently had some improvement, however he has continued to have visual hallucinations and gait instability and has had falls at his nursing home. Due to falls he was brought back to [facility], and transferred to [facility] for consideration of the EEG.   Assessment / Plan / Recommendation Clinical Impression  Pt alert, communicative, eager to eat.  Demonstrated functional swallowing - however, as assessment progressed, WOB increased and mild coughing noted at end of session.  Pt with active mastication with all textures.  Recommend advancing diet to regular consistency, thin liquids.  Meds whole with puree.  Pt should take frequent rest breaks during meal.  SLP will follow briefly to ensure diet toleration  and adequate respiratory/swallow reciprocity.     Aspiration Risk  Mild aspiration risk    Diet Recommendation   regular solids, thin liquids  Medication Administration: Whole meds with puree    Other  Recommendations Oral Care Recommendations: Oral care BID   Follow up Recommendations Other (comment) ([place]ba)      Frequency and Duration min 2x/week  1 week       Prognosis        Swallow Study   General Date of Onset: 11/02/16 HPI: 76 y.o.malewith a history of glioblastoma who presents with altered mental status that started earlier this week. History from the patient is limited because he is confused. Due to concern for recurrence, he has been transitioned to Avastin which was started on 12/7. He has a known history of seizures and has been on Keppra, but developed Keppra induced thrombocytopenia and therefore this was stopped. He has been confused and having visual hallucinations and was admitted to [facility]. 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 It started on Keppra and Decadron. He was also started on vancomycin and Rocephin. He apparently had some improvement, however he has continued to have visual hallucinations and gait instability and has had falls at his nursing home. Due to falls he was brought back to [facility], and transferred to [facility] for consideration of the EEG. Type of Study: Bedside Swallow Evaluation Previous Swallow Assessment: none per records Diet Prior to this Study: NPO Temperature Spikes Noted: No Respiratory Status: Nasal cannula History of Recent Intubation: No Behavior/Cognition: Alert;Cooperative;Confused Oral Cavity Assessment: Dried secretions Oral Care Completed by SLP: No Oral Cavity - Dentition: Dentures, top;Dentures, bottom  Vision: Functional for self-feeding Self-Feeding Abilities: Able to feed self Patient Positioning: Upright in bed Baseline Vocal Quality: Normal Volitional Cough: Strong Volitional  Swallow: Able to elicit    Oral/Motor/Sensory Function Overall Oral Motor/Sensory Function: Within functional limits   Ice Chips Ice chips: Within functional limits   Thin Liquid Thin Liquid: Within functional limits Presentation: Cup;Self Fed (cough near end of evaluation)    Nectar Thick Nectar Thick Liquid: Not tested   Honey Thick Honey Thick Liquid: Not tested   Puree Puree: Within functional limits   Solid   GO   Solid: Within functional limits        Phillip Mckay 11/04/2016,9:00 AM

## 2016-11-05 ENCOUNTER — Inpatient Hospital Stay (HOSPITAL_COMMUNITY): Payer: Medicare Other

## 2016-11-05 DIAGNOSIS — J9601 Acute respiratory failure with hypoxia: Secondary | ICD-10-CM | POA: Diagnosis present

## 2016-11-05 DIAGNOSIS — I10 Essential (primary) hypertension: Secondary | ICD-10-CM

## 2016-11-05 DIAGNOSIS — R778 Other specified abnormalities of plasma proteins: Secondary | ICD-10-CM | POA: Diagnosis present

## 2016-11-05 DIAGNOSIS — R748 Abnormal levels of other serum enzymes: Secondary | ICD-10-CM

## 2016-11-05 DIAGNOSIS — C719 Malignant neoplasm of brain, unspecified: Secondary | ICD-10-CM

## 2016-11-05 DIAGNOSIS — I509 Heart failure, unspecified: Secondary | ICD-10-CM

## 2016-11-05 DIAGNOSIS — K219 Gastro-esophageal reflux disease without esophagitis: Secondary | ICD-10-CM

## 2016-11-05 DIAGNOSIS — R7989 Other specified abnormal findings of blood chemistry: Secondary | ICD-10-CM

## 2016-11-05 DIAGNOSIS — I6783 Posterior reversible encephalopathy syndrome: Secondary | ICD-10-CM

## 2016-11-05 DIAGNOSIS — G8194 Hemiplegia, unspecified affecting left nondominant side: Secondary | ICD-10-CM

## 2016-11-05 DIAGNOSIS — G9340 Encephalopathy, unspecified: Secondary | ICD-10-CM | POA: Diagnosis present

## 2016-11-05 LAB — GLUCOSE, CAPILLARY
Glucose-Capillary: 101 mg/dL — ABNORMAL HIGH (ref 65–99)
Glucose-Capillary: 117 mg/dL — ABNORMAL HIGH (ref 65–99)
Glucose-Capillary: 225 mg/dL — ABNORMAL HIGH (ref 65–99)
Glucose-Capillary: 189 mg/dL — ABNORMAL HIGH (ref 65–99)

## 2016-11-05 LAB — TROPONIN I
Troponin I: 0.19 ng/mL (ref <0.03)
Troponin I: 0.19 ng/mL (ref <0.03)

## 2016-11-05 LAB — ECHOCARDIOGRAM COMPLETE
HEIGHTINCHES: 67 in
WEIGHTICAEL: 2649.6 [oz_av]

## 2016-11-05 MED ORDER — PERFLUTREN LIPID MICROSPHERE
INTRAVENOUS | Status: AC
  Filled 2016-11-05: qty 10

## 2016-11-05 MED ORDER — LOSARTAN POTASSIUM 50 MG PO TABS
25.0000 mg | ORAL_TABLET | Freq: Every day | ORAL | Status: DC
  Administered 2016-11-06 – 2016-11-08 (×3): 25 mg via ORAL
  Filled 2016-11-05 (×3): qty 1

## 2016-11-05 MED ORDER — FUROSEMIDE 40 MG PO TABS
40.0000 mg | ORAL_TABLET | Freq: Two times a day (BID) | ORAL | Status: DC
  Administered 2016-11-06: 40 mg via ORAL
  Filled 2016-11-05: qty 1

## 2016-11-05 MED ORDER — FUROSEMIDE 10 MG/ML IJ SOLN
INTRAMUSCULAR | Status: AC
  Administered 2016-11-05: 40 mg
  Filled 2016-11-05: qty 4

## 2016-11-05 MED ORDER — FUROSEMIDE 10 MG/ML IJ SOLN
40.0000 mg | Freq: Once | INTRAMUSCULAR | Status: AC
  Administered 2016-11-05: 40 mg via INTRAVENOUS

## 2016-11-05 MED ORDER — MORPHINE SULFATE (PF) 2 MG/ML IV SOLN
1.0000 mg | INTRAVENOUS | Status: DC | PRN
Start: 2016-11-05 — End: 2016-11-08
  Administered 2016-11-07: 1 mg via INTRAVENOUS
  Filled 2016-11-05: qty 1

## 2016-11-05 MED ORDER — FUROSEMIDE 10 MG/ML IJ SOLN
40.0000 mg | Freq: Two times a day (BID) | INTRAMUSCULAR | Status: DC
  Filled 2016-11-05: qty 4

## 2016-11-05 MED ORDER — CARVEDILOL 6.25 MG PO TABS
6.2500 mg | ORAL_TABLET | Freq: Two times a day (BID) | ORAL | Status: DC
  Administered 2016-11-06 – 2016-11-08 (×5): 6.25 mg via ORAL
  Filled 2016-11-05 (×5): qty 1

## 2016-11-05 NOTE — Progress Notes (Signed)
  Echocardiogram 2D Echocardiogram has been performed. Attempted to administer Definity, however, IV access was unavailable due to infiltrated IV.   Diamond Nickel 11/05/2016, 10:33 AM

## 2016-11-05 NOTE — Progress Notes (Addendum)
[facility]                                                                              Patient Demographics  Phillip Mckay, is a 76 y.o. male, DOB - 08/09/1941, [id number]  Admit date - 11/02/2016   Admitting Physician Rise Patience, MD  Outpatient Primary MD for the patient is [name], MD  Outpatient specialists:   LOS - 2  days    Chief Complaint  Patient presents with  . Altered Mental Status       Brief summary   Phillip Bourgeois McDonaldis a 76 y.o.malewith a complicated medical history including but not limited to right glioblastoma multiforme, grade 4 with partial resection in 10/25/2015,  with 80% of the tumor followed by Temodar s/p radiation,  with transition to Avastin on 09/20/16 as well as seizure disorder on Keppra followed by Keppra-induced thrombocytopenia with discontinuation of the Keppra on 04/27/16. He presented with progressively worsening altered mental status changes confusion and agitation with falls.  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 abscess) despite negative blood cultures with supportive care. His symptoms was said to have improved and was eventually discharged 10/30/2016 to Nursing Home.  However back at the nursing home he continued to have intermittent confusion with agitation, visual hallucinations as well as gait instability, several falls, hitting his head on the day of admission. He has some intermittent treatment with ativan. CT of the head was negative and had mild leukocytosis of 11.7 K. He was transferred to [facility] from [facility]  for consideration of EEG.    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 - He is prone to seizures due to encephalomalacia from tumor resections, but well controlled on seizure medications - Currently at baseline mental status, confirmed by son at the bedside -EEG on 1/20: normal EEG  - Continue Keppra  Active Problems:  Acute respiratory failure with hypoxia ([place]) - On 1/21, patient was noted to have hypoxia with shortness of breath, chest x-ray showed mild pulmonary edema - BNP was elevated at 2245, troponin elevated at 0.18->0.19->0.19 - Net positive balance of 2.08 L with IV fluids - DC IV fluids, stat chest x-ray today showed progressive bilateral airspace disease with pulmonary edema - Given Lasix 40 mg IV 1 and placed on Lasix 40 mg IV every 12 hours x 2, reassess in a.m., strict I's and O's - 2-D echo, morphine as needed Addendum: 2d- echo: EF 20-25% with grade 1 diastolic dysfunction, prior echo in 10/17 has EF 50-55% - will consult cardiology     Elevated troponin - obtain 2-D echocardiogram for further workup, likely due to demand ischemia from pulmonary edema    Diabetes mellitus ([place]) - CABG stable, continue sliding scale insulin    Benign essential HTN - Currently  stable  GERD - Continue PPI    Glioblastoma multiforme of brain ([place]) - Continue Decadron with Keppra  Fall with generalized deconditioning - PT OT evaluation  Code Status: DNR  DVT Prophylaxis:  TED Family Communication: Discussed in detail with the patient, all imaging results, lab results explained to the patient and son at bed side     Disposition Plan:  Time Spent in minutes  25 minutes  Procedures:    Consultants:   None   Antimicrobials:      Medications  Scheduled Meds: . dexamethasone  4 mg Oral Q breakfast  . furosemide      . furosemide  40 mg Intravenous Q12H  . gabapentin  300 mg Oral TID  . insulin aspart  0-20 Units Subcutaneous TID WC  .  insulin aspart  0-5 Units Subcutaneous QHS  . levETIRAcetam  500 mg Oral BID  . pantoprazole  40 mg Oral QHS  . simvastatin  20 mg Oral QPM  . sodium chloride flush  3 mL Intravenous Q12H  . tamsulosin  0.4 mg Oral Daily   Continuous Infusions: PRN Meds:.acetaminophen **OR** acetaminophen, LORazepam, morphine injection, ondansetron **OR** ondansetron (ZOFRAN) IV, prochlorperazine   Antibiotics   Anti-infectives    None        Subjective:   Phillip Mckay was seen and examined today. Still feeling short of breath, tight and wheezing, pain in the left axillary area in the rib area.  Patient denies dizziness, abdominal pain, N/V/D/C, new weakness, numbess, tingling. No acute events overnight.    Objective:   Vitals:   11/04/16 2200 11/05/16 0000 11/05/16 0450 11/05/16 1034  BP: (!) 139/93 (!) 151/95 (!) 143/95 (!) 145/96  Pulse: (!) 103 100 100 (!) 107  Resp: 18 20 20 20   Temp: 97.7 F (36.5 C) 98 F (36.7 C) 97.7 F (36.5 C) 98.2 F (36.8 C)  TempSrc: Oral Oral Oral Oral  SpO2: 90% 100% 90% 93%  Weight:      Height:        Intake/Output Summary (Last 24 hours) at 11/05/16 1100 Last data filed at 11/05/16 0900  Gross per 24 hour  Intake              480 ml  Output              700 ml  Net             -220 ml     Wt Readings from Last 3 Encounters:  11/02/16 75.1 kg (165 lb 9.6 oz)  10/18/16 73.2 kg (161 lb 4.8 oz)  10/04/16 72.6 kg (160 lb)     Exam  General: Alert and oriented x 3, NAD  HEENT:    Neck: Supple, +JVD  Cardiovascular: S1 S2 auscultated, no rubs, murmurs or gallops. Regular rate and rhythm.  Respiratory: Bibasilar crackles with wheezing   Gastrointestinal: Soft, nontender, nondistended, + bowel sounds  Ext: no cyanosis clubbing or edema  Neuro: AAOx3, Cr N's II- XII. Strength 5/5 upper and lower extremities bilaterally  Skin: No rashes  Psych: Normal affect and demeanor, alert and oriented x3    Data Reviewed:  I have  personally reviewed following labs and imaging studies  Micro Results No results found for this or any previous visit (from the past 240 hour(s)).  Radiology Reports Dg Chest 1 View  Result Date: 11/02/2016 CLINICAL DATA:  Mental status changes. Fall today. History of glioblastoma of brain. EXAM: CHEST 1 VIEW COMPARISON:  02/03/2016  FINDINGS: The heart size is within normal limits. There is stable ectasia of the thoracic aorta. Lung volumes are low bilaterally. There is no evidence of pulmonary edema, consolidation, pneumothorax, nodule or pleural fluid. The visualized skeletal structures are unremarkable. IMPRESSION: Low bilateral lung volumes.  No active disease. Electronically Signed   By: Aletta Edouard M.D.   On: 11/02/2016 15:48   Ct Head Wo Contrast  Result Date: 11/02/2016 CLINICAL DATA:  Altered mental status with slurred speech and dysphagia. Recent fall. Initial encounter. EXAM: CT HEAD WITHOUT CONTRAST TECHNIQUE: Contiguous axial images were obtained from the base of the skull through the vertex without intravenous contrast. COMPARISON:  Head CT and MRI 10/26/2016 FINDINGS: Brain: There is no evidence of acute infarct, acute intracranial hemorrhage, midline shift, or sizable extra-axial fluid collection. Postsurgical changes are again seen in the right parietal lobe with a resection cavity and surrounding low-density in the white matter as well as some adjacent calcification, not significantly changed from the recent prior CT and more fully evaluated on the recent MRI. Postsurgical changes are also noted in the posterior fossa with mild encephalomalacia and gliosis in the left cerebellum. There is mild global cerebral atrophy. Confluent hypodensities in the cerebral white matter bilaterally are unchanged. Vascular: Minimal calcified atherosclerosis involving the carotid siphons. No hyperdense vessel. Skull: Prior right parietal craniotomy and left retromastoid craniectomy. Sinuses/Orbits:  Trace paranasal sinus mucosal thickening. Clear mastoid air cells. Unremarkable orbits. Other: None. IMPRESSION: 1. No evidence of acute intracranial abnormality. 2. Post treatment changes for glioblastoma, more fully evaluated on recent MRI. Electronically Signed   By: Logan Bores M.D.   On: 11/02/2016 17:07   Dg Chest Port 1 View  Result Date: 11/05/2016 CLINICAL DATA:  Chest pain radiating to the left. EXAM: PORTABLE CHEST 1 VIEW COMPARISON:  Single-view of the chest 11/04/2016 and 11/02/2016. FINDINGS: There has been marked progression of bilateral airspace disease over the past 3 days. Small bilateral pleural effusions are seen. Heart size is normal. No pneumothorax. IMPRESSION: Progressive bilateral airspace disease has an appearance most compatible with pulmonary edema. Electronically Signed   By: Inge Rise M.D.   On: 11/05/2016 09:15   Dg Chest Port 1 View  Result Date: 11/04/2016 CLINICAL DATA:  Tachycardia and shortness of breath beginning last night. Chest pain. EXAM: PORTABLE CHEST 1 VIEW COMPARISON:  11/02/2016. FINDINGS: Heart size is at the upper limits of normal. There is some tortuosity of the aorta. There is pulmonary venous hypertension with early pulmonary edema. No consolidation or collapse. No visible effusion. 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 Electronically Signed   By: Nelson Chimes M.D.   On: 11/04/2016 09:37    Lab Data:  CBC:  Recent Labs Lab 11/02/16 1507 11/03/16 0003  WBC 11.7* 9.8  HGB 10.0* 9.3*  HCT 28.7* 27.4*  MCV 99.3 99.3  PLT 146* [id number]*   Basic Metabolic Panel:  Recent Labs Lab 11/02/16 1507 11/03/16 0003  NA 145 145  K 4.5 3.7  CL 111 114*  CO2 24 24  GLUCOSE 136* 116*  BUN 19 14  CREATININE 0.97 0.89  CALCIUM 9.4 8.8*   GFR: Estimated Creatinine Clearance: 66 mL/min (by C-G formula based on SCr of 0.89 mg/dL). Liver Function Tests:  Recent Labs Lab 11/02/16 1507  AST 25  ALT 27  ALKPHOS  36*  BILITOT 1.1  PROT 6.5  ALBUMIN 3.9   No results for input(s): LIPASE, AMYLASE in the last 168 hours. No results for input(s):  AMMONIA in the last 168 hours. Coagulation Profile: No results for input(s): INR, PROTIME in the last 168 hours. Cardiac Enzymes:  Recent Labs Lab 11/04/16 1801 11/04/16 2226 11/05/16 0439  TROPONINI 0.18* 0.19* 0.19*   BNP (last 3 results) No results for input(s): PROBNP in the last 8760 hours. HbA1C: No results for input(s): HGBA1C in the last 72 hours. CBG:  Recent Labs Lab 11/04/16 0622 11/04/16 1149 11/04/16 1703 11/04/16 2148 11/05/16 0622  GLUCAP 76 120* 239* 163* 101*   Lipid Profile: No results for input(s): CHOL, HDL, LDLCALC, TRIG, CHOLHDL, LDLDIRECT in the last 72 hours. Thyroid Function Tests:  Recent Labs  11/03/16 0003  TSH 0.814   Anemia Panel:  Recent Labs  11/03/16 0003  VITAMINB12 283   Urine analysis:    Component Value Date/Time   COLORURINE YELLOW 11/02/2016 1442   APPEARANCEUR CLEAR 11/02/2016 1442   LABSPEC >1.030 (H) 11/02/2016 1442   PHURINE 5.5 11/02/2016 1442   GLUCOSEU NEGATIVE 11/02/2016 1442   HGBUR TRACE (A) 11/02/2016 1442   BILIRUBINUR NEGATIVE 11/02/2016 1442   KETONESUR NEGATIVE 11/02/2016 1442   PROTEINUR 100 (A) 11/02/2016 1442   NITRITE NEGATIVE 11/02/2016 1442   LEUKOCYTESUR NEGATIVE 11/02/2016 1442     Phillip Mckay M.D. [facility] 11/05/2016, 11:00 AM  Pager: [phone] Between 7am to 7pm - call Pager - [phone]  After 7pm go to [email or web address] - password [email or web address]  Call night coverage person covering after 7pm

## 2016-11-05 NOTE — Consult Note (Signed)
CARDIOLOGY CONSULT NOTE       Patient ID: Phillip Mckay MRN: [id number] DOB/AGE: Dec 04, 1940 76 y.o.  Admit date: 11/02/2016 Referring Physician: Tana Coast Primary Physician: [name], MD Primary Cardiologist: New Reason for Consultation: CHF  Principal Problem:   Acute encephalopathy Active Problems:   Diabetes mellitus ([place])   Benign essential HTN   Essential (primary) hypertension   Acid reflux   Glioblastoma multiforme of brain ([place])   Acute respiratory failure with hypoxia ([place])   Elevated troponin   HPI:  76 y.o. admitted with MS changes and visual hallucinations. Grade 4 glioblastoma post resection with chemoRx tramador and XRT. October EF 50-55%. Had tumor spread and started on Avastin in December has had 3 doses. In hospital not clear if he has had seizure activity currently on Keppra. Has had more dyspnea last 2 weeks and CHF in hospital with BNP over 2245. No chest pain ECG normal no acute changes Echo today with EF 20-25% diffuse hypokinesis.  Troponin .19 no evolution. Currently doing better Able to converse with him He has no current iv access Still dyspnic  No previous cardiac issues. He is DNR  ROS All other systems reviewed and negative except as noted above  Past Medical History:  Diagnosis Date  . Brain cancer ([place])   . Diabetes mellitus without complication ([place])   . Glioblastoma multiforme of brain ([place]) 10/27/2015    Family History  Problem Relation Age of Onset  . Cancer Mother   . Diabetes Mother   . Cancer Father   . Cancer Brother     Social History   Social History  . Marital status: Married    Spouse name: N/A  . Number of children: N/A  . Years of education: N/A   Occupational History  . Not on file.   Social History Main Topics  . Smoking status: Former Research scientist (life sciences)  . Smokeless tobacco: Never Used  . Alcohol use No  . Drug use: No  . Sexual activity: No   Other Topics Concern  . Not on file   Social History Narrative    . No narrative on file    Past Surgical History:  Procedure Laterality Date  . CHOLECYSTECTOMY       . dexamethasone  4 mg Oral Q breakfast  . furosemide  40 mg Intravenous Q12H  . gabapentin  300 mg Oral TID  . insulin aspart  0-20 Units Subcutaneous TID WC  . insulin aspart  0-5 Units Subcutaneous QHS  . pantoprazole  40 mg Oral QHS  . simvastatin  20 mg Oral QPM  . sodium chloride flush  3 mL Intravenous Q12H  . tamsulosin  0.4 mg Oral Daily     Physical Exam: Blood pressure (!) 151/97, pulse (!) 117, temperature 98.2 F (36.8 C), temperature source Oral, resp. rate 20, height 5\' 7"  (1.702 m), weight 165 lb 9.6 oz (75.1 kg), SpO2 90 %.   Affect appropriate Chronically ill white male  HEENT: post craniotomy Neck supple with no adenopathy JVP normal no bruits no thyromegaly Lungs poor BS no wheezing and good diaphragmatic motion Heart:  S1/S2 no murmur, no rub, gallop or click PMI increased  Abdomen: benighn, BS positve, no tenderness, no AAA no bruit.  No HSM or HJR Distal pulses intact with no bruits No edema Neuro non-focal Skin warm and dry No muscular weakness   Labs:   Lab Results  Component Value Date   WBC 9.8 11/03/2016   HGB 9.3 (L)  11/03/2016   HCT 27.4 (L) 11/03/2016   MCV 99.3 11/03/2016   PLT 124 (L) 11/03/2016     Recent Labs Lab 11/02/16 1507 11/03/16 0003  NA 145 145  K 4.5 3.7  CL 111 114*  CO2 24 24  BUN 19 14  CREATININE 0.97 0.89  CALCIUM 9.4 8.8*  PROT 6.5  --   BILITOT 1.1  --   ALKPHOS 36*  --   ALT 27  --   AST 25  --   GLUCOSE 136* 116*   Lab Results  Component Value Date   TROPONINI 0.19 ([place]) 11/05/2016   No results found for: CHOL No results found for: HDL No results found for: LDLCALC No results found for: TRIG No results found for: CHOLHDL No results found for: LDLDIRECT    Radiology: Dg Chest 1 View  Result Date: 11/02/2016 CLINICAL DATA:  Mental status changes. Fall today. History of glioblastoma of  brain. EXAM: CHEST 1 VIEW COMPARISON:  02/03/2016 FINDINGS: The heart size is within normal limits. There is stable ectasia of the thoracic aorta. Lung volumes are low bilaterally. There is no evidence of pulmonary edema, consolidation, pneumothorax, nodule or pleural fluid. The visualized skeletal structures are unremarkable. IMPRESSION: Low bilateral lung volumes.  No active disease. Electronically Signed   By: Aletta Edouard M.D.   On: 11/02/2016 15:48   Ct Head Wo Contrast  Result Date: 11/02/2016 CLINICAL DATA:  Altered mental status with slurred speech and dysphagia. Recent fall. Initial encounter. EXAM: CT HEAD WITHOUT CONTRAST TECHNIQUE: Contiguous axial images were obtained from the base of the skull through the vertex without intravenous contrast. COMPARISON:  Head CT and MRI 10/26/2016 FINDINGS: Brain: There is no evidence of acute infarct, acute intracranial hemorrhage, midline shift, or sizable extra-axial fluid collection. Postsurgical changes are again seen in the right parietal lobe with a resection cavity and surrounding low-density in the white matter as well as some adjacent calcification, not significantly changed from the recent prior CT and more fully evaluated on the recent MRI. Postsurgical changes are also noted in the posterior fossa with mild encephalomalacia and gliosis in the left cerebellum. There is mild global cerebral atrophy. Confluent hypodensities in the cerebral white matter bilaterally are unchanged. Vascular: Minimal calcified atherosclerosis involving the carotid siphons. No hyperdense vessel. Skull: Prior right parietal craniotomy and left retromastoid craniectomy. Sinuses/Orbits: Trace paranasal sinus mucosal thickening. Clear mastoid air cells. Unremarkable orbits. Other: None. IMPRESSION: 1. No evidence of acute intracranial abnormality. 2. Post treatment changes for glioblastoma, more fully evaluated on recent MRI. Electronically Signed   By: Logan Bores M.D.   On:  11/02/2016 17:07   Dg Chest Port 1 View  Result Date: 11/05/2016 CLINICAL DATA:  Chest pain radiating to the left. EXAM: PORTABLE CHEST 1 VIEW COMPARISON:  Single-view of the chest 11/04/2016 and 11/02/2016. FINDINGS: There has been marked progression of bilateral airspace disease over the past 3 days. Small bilateral pleural effusions are seen. Heart size is normal. No pneumothorax. IMPRESSION: Progressive bilateral airspace disease has an appearance most compatible with pulmonary edema. Electronically Signed   By: Inge Rise M.D.   On: 11/05/2016 09:15   Dg Chest Port 1 View  Result Date: 11/04/2016 CLINICAL DATA:  Tachycardia and shortness of breath beginning last night. Chest pain. EXAM: PORTABLE CHEST 1 VIEW COMPARISON:  11/02/2016. FINDINGS: Heart size is at the upper limits of normal. There is some tortuosity of the aorta. There is pulmonary venous hypertension with early pulmonary edema. No  consolidation or collapse. No visible effusion. 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 Electronically Signed   By: Nelson Chimes M.D.   On: 11/04/2016 09:37    EKG: SR no acute changes    ASSESSMENT AND PLAN:  CHF: likely from avastin. Would not use this for Rx of his cancer again. Add ACE and beta blocker to meds Change lasix to PO until can get iv access Not a candidate for advanced cardiac testing or invasive procedures  Glioblastoma: patient should probably have port a cath placed Nurse indicates iv team unable to get iv Even with [place] f/u with Dr Whitney Muse for Rx options continue decadron but this also contributes to fluid retention  Urology: on flomax which is fine has foley in place   Cholesterol :  On statin   Signed: Jenkins Rouge 11/05/2016, 6:00 PM

## 2016-11-05 NOTE — Progress Notes (Signed)
Speech Language Pathology Treatment: Dysphagia  Patient Details Name: Phillip Mckay MRN: [id number] DOB: 03-19-1941 Today's Date: 11/05/2016 Time: [id number] SLP Time Calculation (min) (ACUTE ONLY): 11 min  Assessment / Plan / Recommendation Clinical Impression   Pt consumed regular textures with prolonged mastication and oral residue noted, requiring Min-Mod cues and thin liquid washes to clear. His daughter reports having to cut up his food into smaller pieces and says that they avoid certain foods, like meats, that appear too difficult to masticate. SLP discussed downgrading diet, but pt and daughter would prefer to leave diet as is and for her to continue to assist with cutting and tray set-up. Pt had mild increase in SOB during intake and one delayed cough prior to SLP leaving, but daughter verbalizes no overt difficulties during his lunch meal. Will continue to follow closely.    HPI HPI: 76 y.o.malewith a history of glioblastoma who presents with altered mental status that started earlier this week. History from the patient is limited because he is confused. Due to concern for recurrence, he has been transitioned to Avastin which was started on 12/7. He has a known history of seizures and has been on Keppra, but developed Keppra induced thrombocytopenia and therefore this was stopped. He has been confused and having visual hallucinations and was admitted to [facility]. 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 It started on Keppra and Decadron. He was also started on vancomycin and Rocephin. He apparently had some improvement, however he has continued to have visual hallucinations and gait instability and has had falls at his nursing home. Due to falls he was brought back to [facility], and transferred to [facility] for consideration of the EEG.      SLP Plan  Continue with current plan of care     Recommendations  Diet recommendations: Dysphagia 3  (mechanical soft);Thin liquid Liquids provided via: Cup;Straw Medication Administration: Whole meds with puree Supervision: Patient able to self feed;Full supervision/cueing for compensatory strategies Compensations: Slow rate;Small sips/bites;Follow solids with liquid Postural Changes and/or Swallow Maneuvers: Seated upright 90 degrees;Upright 30-60 min after meal                Oral Care Recommendations: Oral care BID Follow up Recommendations: Skilled Nursing facility Plan: Continue with current plan of care       GO                Phillip Mckay 11/05/2016, 4:43 PM  Phillip Mckay, M.A. CCC-SLP [phone]

## 2016-11-05 NOTE — Progress Notes (Signed)
Oncologist PA from [facility] called me to let me know he was taken off [place] via them in past due to "not actually having seizure activity although there is some mention of this in his chart." He was placed on Keppra Px by previous oncologist but he was taken off by new oncologist. Again, He has never had documented seizure. Will D/C for now.   Etta Quill PA-C [facility] [phone]  M-F  (8:30 am- 4 PM)  11/05/2016, 2:57 PM

## 2016-11-05 NOTE — Progress Notes (Signed)
Subjective: Patient's main complaint today is left chest pain when breathing and palpation. Family is at bedside questioning if he might have a rib fracture. Patient has pulmonary edema and is on Lasix currently having some difficulty breathing. Per family patient was more lucid yesterday and today shows a little more confusion. No further seizure activity noted  Exam: Vitals:   11/05/16 0450 11/05/16 1034  BP: (!) 143/95 (!) 145/96  Pulse: 100 (!) 107  Resp: 20 20  Temp: 97.7 F (36.5 C) 98.2 F (36.8 C)        Gen: In bed, NAD MS: Patient is alert, oriented to hospital but not name, he knows it is January 2018. Able to follow commands. CN: Patient does show dysarthria however no aphasia. Cranial nerves II through XII are grossly intact. Motor: Moving all extremities antigravity with 4/5 and greater left arm weakness Sensory: Intact throughout   Pertinent Labs/Diagnostics: EEG showed normal awake and asleep with no seizure activity  MRI brain 1. New marginal enhancement and increased signal abnormality around the right parietal glioblastoma resection cavity compatible with progression. Cannot exclude radiation changes. 2. There is new 2 mm focus of white matter enhancement in the parasagittal right frontal lobe. This is primarily concerning for distant site of disease progression, need correlation with radiation dose distribution.  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 Patient's encephalopathy was resolving yesterday apparently is taking a slight turn and now not as lucid today. No further seizures, EEG showed no epileptiform activity. Patient tolerating antiepileptic medication well.   Recommendations: 1) consider CT of chest to evaluate for possible rib fracture versus injury to intercostal muscle when coughing significantly. 2) continue to treat underlying pulmonary  edema 3) at this time family does not want additional LP which neurology would agree with. 4) family has many questions about the Avastin and would like to have oncology either consult or call family members to discuss the medication and side effects.    11/05/2016, 11:06 AM

## 2016-11-05 NOTE — Evaluation (Signed)
Physical Therapy Evaluation Patient Details Name: Phillip Mckay MRN: [id number] DOB: 1941/07/23 Today's Date: 11/05/2016   History of Present Illness  Pt is a 76 y.o.malewith a history of glioblastoma who presents with AMS that started earlier this week. He has been confused and having visual hallucinations and was admitted to [facility]. 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 He apparently had some improvement with medical therapy, however he has continued to have visual hallucinations and gait instability and has had falls at his nursing home. Due to falls he was brought back to [facility], and transferred to [facility] for consideration of the EEG.  Clinical Impression  Pt admitted with above diagnosis. Pt currently with functional limitations due to the deficits listed below (see PT Problem List). At the time of PT eval, pt was considerably limited due to pain in L rib area. Family is concerned for potential rib fracture s/p recent falls, however x-ray was apparently inconclusive (per daughter). Pt was able to achieve OOB to recliner with +2 assistance and use of the [facility] for safety. Pt was not able to clear floor standing at EOB for pre-gait activity and was not safe to attempt transfers without equipment use. Pt will benefit from skilled PT to increase their independence and safety with mobility to allow discharge to the venue listed below.       Follow Up Recommendations SNF;Supervision/Assistance - 24 hour    Equipment Recommendations  Other (comment) (TBD by next venue of care)    Recommendations for Other Services       Precautions / Restrictions Precautions Precautions: Fall Precaution Comments: Has had several falls since last admission. L rib pain - family feels he may have fractures but x-rays were apparently inconclusive Restrictions Weight Bearing Restrictions: No      Mobility  Bed Mobility Overal bed mobility: Needs Assistance Bed  Mobility: Rolling;Sidelying to Sit Rolling: Min assist Sidelying to sit: Mod assist       General bed mobility comments: Hand-over-hand assist for pt to reach for the bed rail for support. Assist was provided for trunk elevation to full sitting position.   Transfers Overall transfer level: Needs assistance   Transfers: Sit to/from Stand Sit to Stand: Mod assist;+2 physical assistance         General transfer comment: Max+1 or mod +2 required for pt to achieve and maintain sit<>stand. Stedy used for safe transition to Financial [employer].   Ambulation/Gait             General Gait Details: Attempted pre-gait at EOB before Stedy was used. Pt was unable to clear floor to achieve marching in place.   Stairs            Wheelchair Mobility    Modified Rankin (Stroke Patients Only)       Balance                                             Pertinent Vitals/Pain Pain Assessment: 0-10 Pain Score: 6  Pain Location: L rib Pain Descriptors / Indicators: Sharp;Stabbing (Intermittent) Pain Intervention(s): Monitored during session;Limited activity within patient's tolerance    Home Living Family/patient expects to be discharged to:: Skilled nursing facility                 Additional Comments: [facility] in [place], [place], Admissions  Director has come down to talk with pt and family about d/c    Prior Function Level of Independence: Needs assistance   Gait / Transfers Assistance Needed: RW all the time  ADL's / Homemaking Assistance Needed: Bathing, dressing, meal prep        Hand Dominance   Dominant Hand: Right    Extremity/Trunk Assessment   Upper Extremity Assessment Upper Extremity Assessment: LUE deficits/detail LUE Deficits / Details: Decreased AROM and acute pain with UE movement    Lower Extremity Assessment Lower Extremity Assessment: Generalized weakness    Cervical / Trunk  Assessment Cervical / Trunk Assessment: Kyphotic  Communication      Cognition Arousal/Alertness: Awake/alert Behavior During Therapy: WFL for tasks assessed/performed Overall Cognitive Status: Within Functional Limits for tasks assessed                      General Comments      Exercises     Assessment/Plan    PT Assessment Patient needs continued PT services  PT Problem List Decreased strength;Decreased range of motion;Decreased activity tolerance;Decreased balance;Decreased mobility;Decreased knowledge of use of DME;Decreased safety awareness;Decreased knowledge of precautions;Pain          PT Treatment Interventions DME instruction;Gait training;Functional mobility training;Stair training;Therapeutic activities;Therapeutic exercise;Neuromuscular re-education;Patient/family education    PT Goals (Current goals can be found in the Care Plan section)  Acute Rehab PT Goals Patient Stated Goal: Decrease pain PT Goal Formulation: With patient/family Time For Goal Achievement: 11/19/16 Potential to Achieve Goals: Fair    Frequency Min 3X/week   Barriers to discharge        Co-evaluation               End of Session Equipment Utilized During Treatment: Gait belt (Around chest due to L rib pain) Activity Tolerance: Patient limited by pain;Patient limited by fatigue (weakness) Patient left: in chair;with chair alarm set;with call bell/phone within reach;with family/visitor present (With SLP) Nurse Communication: Mobility status (Use of Stedy)         [id number] PT Time Calculation (min) (ACUTE ONLY): 35 min   Charges:   PT Evaluation $PT Eval Moderate Complexity: 1 Procedure PT Treatments $Gait Training: 8-22 mins   PT G Codes:        [name] 2016/12/01, 4:23 PM  Rolinda Roan, PT, DPT Acute Rehabilitation Services Pager: [phone]

## 2016-11-05 NOTE — Care Management Note (Signed)
Case Management Note  Patient Details  Name: Phillip Mckay MRN: [id number] Date of Birth: [birth year]  Subjective/Objective:                  Patient was admitted with altered mental status. Came from [facility]. CM will follow for discharge planning pending PT/OT evals and physician orders.   Action/Plan:   Expected Discharge Date:                  Expected Discharge Plan:     In-House Referral:     Discharge planning Services     Post Acute Care Choice:    Choice offered to:     DME Arranged:    DME Agency:     HH Arranged:    HH Agency:     Status of Service:     If discussed at [employer] of Stay Meetings, dates discussed:    Additional Comments:  Rolm Baptise, RN 11/05/2016, 10:24 AM

## 2016-11-06 ENCOUNTER — Telehealth (HOSPITAL_COMMUNITY): Payer: Self-pay | Admitting: *Deleted

## 2016-11-06 ENCOUNTER — Telehealth (HOSPITAL_COMMUNITY): Payer: Self-pay | Admitting: Emergency Medicine

## 2016-11-06 LAB — CBC
HCT: 30.2 % — ABNORMAL LOW (ref 39.0–52.0)
Hemoglobin: 10.3 g/dL — ABNORMAL LOW (ref 13.0–17.0)
MCH: 33.7 pg (ref 26.0–34.0)
MCHC: 34.1 g/dL (ref 30.0–36.0)
MCV: 98.7 fL (ref 78.0–100.0)
Platelets: 153 10*3/uL (ref 150–400)
RBC: 3.06 MIL/uL — ABNORMAL LOW (ref 4.22–5.81)
RDW: 15.1 % (ref 11.5–15.5)
WBC: 12.2 10*3/uL — ABNORMAL HIGH (ref 4.0–10.5)

## 2016-11-06 LAB — GLUCOSE, CAPILLARY
GLUCOSE-CAPILLARY: 120 mg/dL — AB (ref 65–99)
GLUCOSE-CAPILLARY: 242 mg/dL — AB (ref 65–99)
GLUCOSE-CAPILLARY: 222 mg/dL — AB (ref 65–99)
Glucose-Capillary: 149 mg/dL — ABNORMAL HIGH (ref 65–99)

## 2016-11-06 LAB — BASIC METABOLIC PANEL
Anion gap: 13 (ref 5–15)
BUN: 17 mg/dL (ref 6–20)
CALCIUM: 8.7 mg/dL — AB (ref 8.9–10.3)
CO2: 22 mmol/L (ref 22–32)
CREATININE: 1.11 mg/dL (ref 0.61–1.24)
Chloride: 110 mmol/L (ref 101–111)
GFR calc Af Amer: >60 mL/min (ref >60)
GFR calc non Af Amer: >60 mL/min (ref >60)
Glucose, Bld: 144 mg/dL — ABNORMAL HIGH (ref 65–99)
Potassium: 3.4 mmol/L — ABNORMAL LOW (ref 3.5–5.1)
SODIUM: 145 mmol/L (ref 135–145)

## 2016-11-06 MED ORDER — FUROSEMIDE 40 MG PO TABS
40.0000 mg | ORAL_TABLET | Freq: Three times a day (TID) | ORAL | Status: DC
  Administered 2016-11-06 – 2016-11-08 (×7): 40 mg via ORAL
  Filled 2016-11-06 (×7): qty 1

## 2016-11-06 MED ORDER — POTASSIUM CHLORIDE CRYS ER 20 MEQ PO TBCR
40.0000 meq | EXTENDED_RELEASE_TABLET | Freq: Once | ORAL | Status: AC
Start: 2016-11-06 — End: 2016-11-06
  Administered 2016-11-06: 40 meq via ORAL
  Filled 2016-11-06: qty 2

## 2016-11-06 MED ORDER — POLYETHYLENE GLYCOL 3350 17 G PO PACK
17.0000 g | PACK | Freq: Two times a day (BID) | ORAL | Status: DC
  Administered 2016-11-06 – 2016-11-07 (×3): 17 g via ORAL
  Filled 2016-11-06 (×4): qty 1

## 2016-11-06 NOTE — Evaluation (Signed)
Occupational Therapy Evaluation Patient Details Name: Phillip Mckay MRN: [id number] DOB: Nov 13, 1940 Today's Date: 11/06/2016    History of Present Illness Pt is a 76 y.o.malewith a history of glioblastoma who presents with AMS that started earlier this week. He has been confused and having visual hallucinations and was admitted to [facility]. 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 He apparently had some improvement with medical therapy, however he has continued to have visual hallucinations and gait instability and has had falls at his nursing home. Due to falls he was brought back to [facility], and transferred to [facility] for consideration of the EEG.   Clinical Impression   PTA, pt was staying at SNF and required assistance with all ADL but was ambulating with RW with no physical assistance. Pt currently presents disoriented to time, with decreased short-term memory, and with decreased sequencing and problem solving abilities. Additionally he requires max assist with LB ADL and min assist with UB ADL. Limited evaluation this session as pt receiving dinner tray and very hungry. Will continue to assess level of ADL performance in subsequent sessions. Pt and family report plan to return to SNF post-acute D/C. Feel pt would benefit from continued OT services through SNF placement in order to maximize independence with ADL. OT will continue to follow acutely to improve ADL and functional mobility.    Follow Up Recommendations  SNF;Supervision/Assistance - 24 hour    Equipment Recommendations  Other (comment) (TBD at next venue of care)    Recommendations for Other Services       Precautions / Restrictions Precautions Precautions: Fall Precaution Comments: Has had several falls since last admission. L rib pain - family feels he may have fractures but x-rays were apparently inconclusive Restrictions Weight Bearing Restrictions: No      Mobility Bed  Mobility Overal bed mobility: Needs Assistance Bed Mobility: Rolling Rolling: Min assist         General bed mobility comments: Also required min assist +2 to pull self up in bed for proper positioning for eating.  Transfers                 General transfer comment: UNable to attempt at this time.    Balance                                            ADL Overall ADL's : Needs assistance/impaired Eating/Feeding: Set up;Supervision/ safety;Bed level   Grooming: Set up;Supervision/safety;Bed level   Upper Body Bathing: Minimal assistance;Bed level   Lower Body Bathing: Maximal assistance;Sitting/lateral leans   Upper Body Dressing : Minimal assistance;Bed level   Lower Body Dressing: Maximal assistance;Sitting/lateral leans                 General ADL Comments: Limited evaluation this session as pt's dinner arrived and pt very hungry. Noted difficulty sequencing self-feeding tasks. Able to pull self up in bed with min assist +2.     Vision Vision Assessment?: No apparent visual deficits   Perception     Praxis Praxis Praxis tested?:  (Continue to assess)    Pertinent Vitals/Pain Pain Assessment: No/denies pain     Hand Dominance Right   Extremity/Trunk Assessment Upper Extremity Assessment Upper Extremity Assessment: LUE deficits/detail;Generalized weakness LUE Deficits / Details: Decreased AROM but able to use functionally. LUE Coordination: decreased fine motor   Lower Extremity Assessment  Lower Extremity Assessment: Generalized weakness       Communication Communication Communication: No difficulties   Cognition Arousal/Alertness: Awake/alert Behavior During Therapy: WFL for tasks assessed/performed Overall Cognitive Status: Impaired/Different from baseline Area of Impairment: Orientation;Memory;Awareness;Problem solving Orientation Level: Disoriented to;Time   Memory: Decreased short-term memory     Awareness:  Emergent Problem Solving: Slow processing;Decreased initiation;Difficulty sequencing General Comments: Difficulty sequencing feeding activities and noted holding spoon inefficiently. Able to recall 2/3 words with delayed recall task. Reporting that the year is 2000.    General Comments       Exercises       Shoulder Instructions      Home Living Family/patient expects to be discharged to:: Skilled nursing facility                                 Additional Comments: [facility] in [place], Admissions Director has come down to talk with pt and family about d/c      Prior Functioning/Environment Level of Independence: Needs assistance  Gait / Transfers Assistance Needed: RW all the time ADL's / Homemaking Assistance Needed: Bathing, dressing, meal prep            OT Problem List: Decreased strength;Decreased range of motion;Decreased activity tolerance;Impaired balance (sitting and/or standing);Decreased safety awareness;Decreased knowledge of use of DME or AE;Decreased knowledge of precautions;Pain   OT Treatment/Interventions: Self-care/ADL training;Therapeutic exercise;Energy conservation;Therapeutic activities;Patient/family education;Balance training    OT Goals(Current goals can be found in the care plan section) Acute Rehab OT Goals Patient Stated Goal: Decrease pain OT Goal Formulation: With patient Time For Goal Achievement: 11/13/16 Potential to Achieve Goals: Good ADL Goals Pt Will Perform Grooming: with min assist;standing Pt Will Perform Upper Body Bathing: with min assist;sitting Pt Will Perform Lower Body Bathing: with min assist;sit to/from stand Pt Will Perform Upper Body Dressing: with min assist;sitting Pt Will Perform Lower Body Dressing: with min assist;sit to/from stand Pt Will Transfer to Toilet: ambulating;bedside commode;with min guard assist Pt Will Perform Toileting - Clothing  Manipulation and hygiene: sit to/from stand;with supervision Pt/caregiver will Perform Home Exercise Program: Increased ROM;Increased strength;Left upper extremity;With Supervision;With written HEP provided Additional ADL Goal #1: Pt will appropriately sequence self-feeding task with no more than 1 verbal cue.   OT Frequency: Min 2X/week   Barriers to D/C:            Co-evaluation              End of Session Equipment Utilized During Treatment: Oxygen  Activity Tolerance: Patient tolerated treatment well Patient left: in bed;with call bell/phone within reach;with family/visitor present   Time: [id number] OT Time Calculation (min): 19 min Charges:  OT General Charges $OT Visit: 1 Procedure OT Evaluation $OT Eval Moderate Complexity: 1 Procedure G-CodesNorman Herrlich, OTR/L [phone] 11/06/2016, 5:30 PM

## 2016-11-06 NOTE — Telephone Encounter (Signed)
Phillip Mckay talked with patients son.

## 2016-11-06 NOTE — Telephone Encounter (Signed)
Spoke with Dr Whitney Muse and she was ok with them canceling their appts.  She has been following his care.  If they want hospice then we can help them with that when he is discharged if they need that.  I called and spoke with the son Shanon Brow and we went over everything.  He said he would call if they needed anything if and when he got discharged from the hospital.

## 2016-11-06 NOTE — Progress Notes (Signed)
Patient ID: Phillip Mckay, male   DOB: 1940-11-23, 76 y.o.   MRN: [id number]    Subjective:  Denies SSCP, palpitations or Dyspnea   Objective:  Vitals:   11/05/16 2214 11/06/16 0101 11/06/16 0454 11/06/16 0817  BP: (!) 146/92 (!) 149/104 136/90 (!) 143/85  Pulse: (!) 120 (!) 114 (!) 103 98  Resp: (!) 22 (!) 24 18 20   Temp: 98.2 F (36.8 C) 98.9 F (37.2 C) 99 F (37.2 C) 97.7 F (36.5 C)  TempSrc: Oral Axillary Oral Oral  SpO2: 90% 94% 93% 94%  Weight:      Height:        Intake/Output from previous day:  Intake/Output Summary (Last 24 hours) at 11/06/16 0834 Last data filed at 11/06/16 0454  Gross per 24 hour  Intake              240 ml  Output             2300 ml  Net            -2060 ml    Physical Exam: Affect appropriate Chronically ill pale white male  HEENT:post craniotomy  Neck supple with no adenopathy JVP normal no bruits no thyromegaly Lungs clear with no wheezing and good diaphragmatic motion Heart:  S1/S2 no murmur, no rub, gallop or click PMI normal Abdomen: benighn, BS positve, no tenderness, no AAA no bruit.  No HSM or HJR Distal pulses intact with no bruits No edema Neuro non-focal Skin warm and dry No muscular weakness   Lab Results: Basic Metabolic Panel:  Recent Labs  11/06/16 0312  NA 145  K 3.4*  CL 110  CO2 22  GLUCOSE 144*  BUN 17  CREATININE 1.11  CALCIUM 8.7*   CBC:  Recent Labs  11/06/16 0312  WBC 12.2*  HGB 10.3*  HCT 30.2*  MCV 98.7  PLT 153   Cardiac Enzymes:  Recent Labs  11/04/16 1801 11/04/16 2226 11/05/16 0439  TROPONINI 0.18* 0.19* 0.19*    Imaging: Dg Chest Port 1 View  Result Date: 11/05/2016 CLINICAL DATA:  Chest pain radiating to the left. EXAM: PORTABLE CHEST 1 VIEW COMPARISON:  Single-view of the chest 11/04/2016 and 11/02/2016. FINDINGS: There has been marked progression of bilateral airspace disease over the past 3 days. Small bilateral pleural effusions are seen. Heart size  is normal. No pneumothorax. IMPRESSION: Progressive bilateral airspace disease has an appearance most compatible with pulmonary edema. Electronically Signed   By: Inge Rise M.D.   On: 11/05/2016 09:15   Dg Chest Port 1 View  Result Date: 11/04/2016 CLINICAL DATA:  Tachycardia and shortness of breath beginning last night. Chest pain. EXAM: PORTABLE CHEST 1 VIEW COMPARISON:  11/02/2016. FINDINGS: Heart size is at the upper limits of normal. There is some tortuosity of the aorta. There is pulmonary venous hypertension with early pulmonary edema. No consolidation or collapse. No visible effusion. 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 Electronically Signed   By: Nelson Chimes M.D.   On: 11/04/2016 09:37    Cardiac Studies:  ECG:  Orders placed or performed during the hospital encounter of 11/02/16  . ED EKG  . EKG 12-Lead  . EKG 12-Lead  . EKG     Telemetry: NSR rates 90-100  Echo: EF 20-25%   Medications:   . carvedilol  6.25 mg Oral BID WC  . dexamethasone  4 mg Oral Q breakfast  . furosemide  40 mg Oral BID  . gabapentin  300 mg Oral TID  . insulin aspart  0-20 Units Subcutaneous TID WC  . insulin aspart  0-5 Units Subcutaneous QHS  . losartan  25 mg Oral Daily  . pantoprazole  40 mg Oral QHS  . simvastatin  20 mg Oral QPM  . sodium chloride flush  3 mL Intravenous Q12H  . tamsulosin  0.4 mg Oral Daily      Assessment/Plan:  CHF:  New onset likely from avastin improved Medical Rx adjusted supplement K not a candidate For cath or intervention  Glioblastoma: post resection with recurrence and chemo with tamador and avastin. 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

## 2016-11-06 NOTE — Progress Notes (Signed)
[facility]                                                                              Patient Demographics  Phillip Mckay, is a 76 y.o. male, DOB - 1941-03-14, [id number]  Admit date - 11/02/2016   Admitting Physician Rise Patience, MD  Outpatient Primary MD for the patient is [name], MD  Outpatient specialists:   LOS - 3  days    Chief Complaint  Patient presents with  . Altered Mental Status       Brief summary   Severino Kuklinski McDonaldis a 76 y.o.malewith a complicated medical history including but not limited to right glioblastoma multiforme, grade 4 with partial resection in 10/25/2015,  with 80% of the tumor followed by Temodar s/p radiation,  with transition to Avastin on 09/20/16 as well as seizure disorder on Keppra followed by Keppra-induced thrombocytopenia with discontinuation of the Keppra on 04/27/16. He presented with progressively worsening altered mental status changes confusion and agitation with falls.  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 abscess) despite negative blood cultures with supportive care. His symptoms was said to have improved and was eventually discharged 10/30/2016 to Nursing Home.  However back at the nursing home he continued to have intermittent confusion with agitation, visual hallucinations as well as gait instability, several falls, hitting his head on the day of admission. He has some intermittent treatment with ativan. CT of the head was negative and had mild leukocytosis of 11.7 K. He was transferred to [facility] from [facility]  for consideration of EEG.    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 - He is prone to seizures due to encephalomalacia from tumor resections, but well controlled on seizure medications - Currently at baseline mental status, confirmed by son at the bedside - EEG on 1/20: normal EEG  - Continue Keppra  Active Problems:  Acute respiratory failure with hypoxia ([place]) likely due to new onset acute systolic CHF - Worsening, patient on Ventimask this morning - On 1/21, patient was noted to have hypoxia with shortness of breath, chest x-ray showed pulmonary edema - BNP was elevated at 2245, troponin elevated at 0.18->0.19->0.19 - Patient was placed on Lasix, 2-D echo showed EF of 20-25% with grade 1 diastolic dysfunction -  prior echo in 10/17 has EF 50-55% -  Cardiology was consulted, patient was seen by Dr. Johnsie Cancel. Continue Lasix, changed to oral until he can get IV access. Unfortunately no peripheral IV access could be established despite multiple attempts, family requested increase oral Lasix today and hold off on the PICC line.  - Per cardiology added beta blocker and ACE inhibitor    Elevated troponin - Likely due to new onset acute systolic CHF caused by Avastin    Diabetes mellitus ([place]) - CABG  stable, continue sliding scale insulin    Benign essential HTN - Currently stable  GERD - Continue PPI    Glioblastoma multiforme of brain (HCC) - Continue Decadron with Keppra  Fall with generalized deconditioning - PT OT evaluation  Code Status: DNR  DVT Prophylaxis:  TED Family Communication: Discussed in detail with the patient, all imaging results, lab results explained to the patient, wife and daughter and son at bed side     Disposition Plan:  Time Spent in minutes  25 minutes  Procedures:  2-D echo  Consultants:   Cardiology  Antimicrobials:      Medications  Scheduled Meds: . carvedilol  6.25 mg Oral BID WC  . dexamethasone  4 mg Oral  Q breakfast  . furosemide  40 mg Oral Q8H  . gabapentin  300 mg Oral TID  . insulin aspart  0-20 Units Subcutaneous TID WC  . insulin aspart  0-5 Units Subcutaneous QHS  . losartan  25 mg Oral Daily  . pantoprazole  40 mg Oral QHS  . simvastatin  20 mg Oral QPM  . sodium chloride flush  3 mL Intravenous Q12H  . tamsulosin  0.4 mg Oral Daily   Continuous Infusions: PRN Meds:.acetaminophen **OR** acetaminophen, LORazepam, morphine injection, ondansetron **OR** ondansetron (ZOFRAN) IV, prochlorperazine   Antibiotics   Anti-infectives    None        Subjective:   Tadeh Facemire was seen and examined today. Still short of breath on Ventimask this morning. Family at the bedside. Pain in the left lower rib area with coughing.  Patient denies dizziness, abdominal pain, N/V/D/C, new weakness, numbess, tingling. No acute events overnight.    Objective:   Vitals:   11/06/16 0101 11/06/16 0454 11/06/16 0817 11/06/16 1004  BP: (!) 149/104 136/90 (!) 143/85 127/82  Pulse: (!) 114 (!) 103 98 (!) 102  Resp: (!) 24 18 20 18   Temp: 98.9 F (37.2 C) 99 F (37.2 C) 97.7 F (36.5 C) 99.5 F (37.5 C)  TempSrc: Axillary Oral Oral Oral  SpO2: 94% 93% 94% 97%  Weight:      Height:        Intake/Output Summary (Last 24 hours) at 11/06/16 1400 Last data filed at 11/06/16 0454  Gross per 24 hour  Intake                0 ml  Output             1500 ml  Net            -1500 ml     Wt Readings from Last 3 Encounters:  11/02/16 75.1 kg (165 lb 9.6 oz)  10/18/16 73.2 kg (161 lb 4.8 oz)  10/04/16 72.6 kg (160 lb)     Exam  General: Alert and oriented x 3, NAD, Ventimask  HEENT:    Neck: Supple, +JVD  Cardiovascular: S1 S2 auscultated, no rubs, murmurs or gallops. Regular rate and rhythm.  Respiratory: Diminished breath sounds at the bases  Gastrointestinal: Soft, nontender, nondistended, + bowel sounds  Ext: no cyanosis clubbing or edema  Neuro: no new deficits  Skin: No  rashes  Psych: Normal affect and demeanor, alert and oriented x3    Data Reviewed:  I have personally reviewed following labs and imaging studies  Micro Results No results found for this or any previous visit (from the past 240 hour(s)).  Radiology Reports Dg Chest 1 View  Result Date: 11/02/2016 CLINICAL DATA:  Mental status changes.  Fall today. History of glioblastoma of brain. EXAM: CHEST 1 VIEW COMPARISON:  02/03/2016 FINDINGS: The heart size is within normal limits. There is stable ectasia of the thoracic aorta. Lung volumes are low bilaterally. There is no evidence of pulmonary edema, consolidation, pneumothorax, nodule or pleural fluid. The visualized skeletal structures are unremarkable. IMPRESSION: Low bilateral lung volumes.  No active disease. Electronically Signed   By: Aletta Edouard M.D.   On: 11/02/2016 15:48   Ct Head Wo Contrast  Result Date: 11/02/2016 CLINICAL DATA:  Altered mental status with slurred speech and dysphagia. Recent fall. Initial encounter. EXAM: CT HEAD WITHOUT CONTRAST TECHNIQUE: Contiguous axial images were obtained from the base of the skull through the vertex without intravenous contrast. COMPARISON:  Head CT and MRI 10/26/2016 FINDINGS: Brain: There is no evidence of acute infarct, acute intracranial hemorrhage, midline shift, or sizable extra-axial fluid collection. Postsurgical changes are again seen in the right parietal lobe with a resection cavity and surrounding low-density in the white matter as well as some adjacent calcification, not significantly changed from the recent prior CT and more fully evaluated on the recent MRI. Postsurgical changes are also noted in the posterior fossa with mild encephalomalacia and gliosis in the left cerebellum. There is mild global cerebral atrophy. Confluent hypodensities in the cerebral white matter bilaterally are unchanged. Vascular: Minimal calcified atherosclerosis involving the carotid siphons. No hyperdense  vessel. Skull: Prior right parietal craniotomy and left retromastoid craniectomy. Sinuses/Orbits: Trace paranasal sinus mucosal thickening. Clear mastoid air cells. Unremarkable orbits. Other: None. IMPRESSION: 1. No evidence of acute intracranial abnormality. 2. Post treatment changes for glioblastoma, more fully evaluated on recent MRI. Electronically Signed   By: Logan Bores M.D.   On: 11/02/2016 17:07   Dg Chest Port 1 View  Result Date: 11/05/2016 CLINICAL DATA:  Chest pain radiating to the left. EXAM: PORTABLE CHEST 1 VIEW COMPARISON:  Single-view of the chest 11/04/2016 and 11/02/2016. FINDINGS: There has been marked progression of bilateral airspace disease over the past 3 days. Small bilateral pleural effusions are seen. Heart size is normal. No pneumothorax. IMPRESSION: Progressive bilateral airspace disease has an appearance most compatible with pulmonary edema. Electronically Signed   By: Inge Rise M.D.   On: 11/05/2016 09:15   Dg Chest Port 1 View  Result Date: 11/04/2016 CLINICAL DATA:  Tachycardia and shortness of breath beginning last night. Chest pain. EXAM: PORTABLE CHEST 1 VIEW COMPARISON:  11/02/2016. FINDINGS: Heart size is at the upper limits of normal. There is some tortuosity of the aorta. There is pulmonary venous hypertension with early pulmonary edema. No consolidation or collapse. No visible effusion. 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 Electronically Signed   By: Nelson Chimes M.D.   On: 11/04/2016 09:37    Lab Data:  CBC:  Recent Labs Lab 11/02/16 1507 11/03/16 0003 11/06/16 0312  WBC 11.7* 9.8 12.2*  HGB 10.0* 9.3* 10.3*  HCT 28.7* 27.4* 30.2*  MCV 99.3 99.3 98.7  PLT 146* 124* 0000000   Basic Metabolic Panel:  Recent Labs Lab 11/02/16 1507 11/03/16 0003 11/06/16 0312  NA 145 145 145  K 4.5 3.7 3.4*  CL 111 114* 110  CO2 24 24 22   GLUCOSE 136* 116* 144*  BUN 19 14 17   CREATININE 0.97 0.89 1.11  CALCIUM 9.4 8.8*  8.7*   GFR: Estimated Creatinine Clearance: 52.9 mL/min (by C-G formula based on SCr of 1.11 mg/dL). Liver Function Tests:  Recent Labs Lab 11/02/16 1507  AST 25  ALT 27  ALKPHOS 36*  BILITOT 1.1  PROT 6.5  ALBUMIN 3.9   No results for input(s): LIPASE, AMYLASE in the last 168 hours. No results for input(s): AMMONIA in the last 168 hours. Coagulation Profile: No results for input(s): INR, PROTIME in the last 168 hours. Cardiac Enzymes:  Recent Labs Lab 11/04/16 1801 11/04/16 2226 11/05/16 0439  TROPONINI 0.18* 0.19* 0.19*   BNP (last 3 results) No results for input(s): PROBNP in the last 8760 hours. HbA1C: No results for input(s): HGBA1C in the last 72 hours. CBG:  Recent Labs Lab 11/05/16 1134 11/05/16 1624 11/05/16 2211 11/06/16 0647 11/06/16 1124  GLUCAP 117* 225* 189* 120* 149*   Lipid Profile: No results for input(s): CHOL, HDL, LDLCALC, TRIG, CHOLHDL, LDLDIRECT in the last 72 hours. Thyroid Function Tests: No results for input(s): TSH, T4TOTAL, FREET4, T3FREE, THYROIDAB in the last 72 hours. Anemia Panel: No results for input(s): VITAMINB12, FOLATE, FERRITIN, TIBC, IRON, RETICCTPCT in the last 72 hours. Urine analysis:    Component Value Date/Time   COLORURINE YELLOW 11/02/2016 1442   APPEARANCEUR CLEAR 11/02/2016 1442   LABSPEC >1.030 (H) 11/02/2016 1442   PHURINE 5.5 11/02/2016 1442   GLUCOSEU NEGATIVE 11/02/2016 1442   HGBUR TRACE (A) 11/02/2016 1442   BILIRUBINUR NEGATIVE 11/02/2016 1442   KETONESUR NEGATIVE 11/02/2016 1442   PROTEINUR 100 (A) 11/02/2016 1442   NITRITE NEGATIVE 11/02/2016 1442   LEUKOCYTESUR NEGATIVE 11/02/2016 1442     Guerline Happ M.D. [facility] 11/06/2016, 2:00 PM  Pager: [phone] Between 7am to 7pm - call Pager - [phone]  After 7pm go to [email or web address] - password [email or web address]  Call night coverage person covering after 7pm

## 2016-11-07 ENCOUNTER — Inpatient Hospital Stay (HOSPITAL_COMMUNITY): Payer: Medicare Other

## 2016-11-07 DIAGNOSIS — R938 Abnormal findings on diagnostic imaging of other specified body structures: Secondary | ICD-10-CM

## 2016-11-07 DIAGNOSIS — R9389 Abnormal findings on diagnostic imaging of other specified body structures: Secondary | ICD-10-CM

## 2016-11-07 LAB — GLUCOSE, CAPILLARY
GLUCOSE-CAPILLARY: 127 mg/dL — AB (ref 65–99)
GLUCOSE-CAPILLARY: 190 mg/dL — AB (ref 65–99)
GLUCOSE-CAPILLARY: 220 mg/dL — AB (ref 65–99)
GLUCOSE-CAPILLARY: 255 mg/dL — AB (ref 65–99)

## 2016-11-07 LAB — CBC
HEMATOCRIT: 27 % — AB (ref 39.0–52.0)
HEMOGLOBIN: 9.3 g/dL — AB (ref 13.0–17.0)
MCH: 33.7 pg (ref 26.0–34.0)
MCHC: 34.4 g/dL (ref 30.0–36.0)
MCV: 97.8 fL (ref 78.0–100.0)
Platelets: 129 10*3/uL — ABNORMAL LOW (ref 150–400)
RBC: 2.76 MIL/uL — ABNORMAL LOW (ref 4.22–5.81)
RDW: 14.6 % (ref 11.5–15.5)
WBC: 9.6 10*3/uL (ref 4.0–10.5)

## 2016-11-07 LAB — D-DIMER, QUANTITATIVE: D-Dimer, Quant: 3.01 ug/mL-FEU — ABNORMAL HIGH (ref 0.00–0.50)

## 2016-11-07 LAB — BASIC METABOLIC PANEL
ANION GAP: 8 (ref 5–15)
BUN: 23 mg/dL — ABNORMAL HIGH (ref 6–20)
CHLORIDE: 109 mmol/L (ref 101–111)
CO2: 25 mmol/L (ref 22–32)
Calcium: 8.5 mg/dL — ABNORMAL LOW (ref 8.9–10.3)
Creatinine, Ser: 1.07 mg/dL (ref 0.61–1.24)
GFR calc Af Amer: >60 mL/min (ref >60)
GLUCOSE: 139 mg/dL — AB (ref 65–99)
POTASSIUM: 3.2 mmol/L — AB (ref 3.5–5.1)
SODIUM: 142 mmol/L (ref 135–145)

## 2016-11-07 MED ORDER — IOPAMIDOL (ISOVUE-370) INJECTION 76%
INTRAVENOUS | Status: AC
  Administered 2016-11-07: 100 mL
  Filled 2016-11-07: qty 100

## 2016-11-07 MED ORDER — POTASSIUM CHLORIDE CRYS ER 20 MEQ PO TBCR
40.0000 meq | EXTENDED_RELEASE_TABLET | Freq: Once | ORAL | Status: AC
  Administered 2016-11-07: 40 meq via ORAL
  Filled 2016-11-07: qty 2

## 2016-11-07 MED ORDER — AMOXICILLIN-POT CLAVULANATE 875-125 MG PO TABS
1.0000 | ORAL_TABLET | Freq: Two times a day (BID) | ORAL | Status: DC
  Administered 2016-11-07 – 2016-11-08 (×2): 1 via ORAL
  Filled 2016-11-07 (×2): qty 1

## 2016-11-07 NOTE — Care Management Important Message (Signed)
Important Message  Patient Details  Name: Phillip Mckay MRN: [id number] Date of Birth: 05-24-1941   Medicare Important Message Given:  Yes    Orbie Pyo 11/07/2016, 12:16 PM

## 2016-11-07 NOTE — Progress Notes (Addendum)
[facility]                                                                              Patient Demographics  Phillip Mckay, is a 76 y.o. male, DOB - 09/08/1941, [id number]  Admit date - 11/02/2016   Admitting Physician Rise Patience, MD  Outpatient Primary MD for the patient is [name], MD  Outpatient specialists:   LOS - 4  days    Chief Complaint  Patient presents with  . Altered Mental Status       Brief summary   Phillip Mckay a 76 y.o.malewith a complicated medical history including but not limited to right glioblastoma multiforme, grade 4 with partial resection in 10/25/2015,  with 80% of the tumor followed by Temodar s/p radiation,  with transition to Avastin on 09/20/16 as well as seizure disorder on Keppra followed by Keppra-induced thrombocytopenia with discontinuation of the Keppra on 04/27/16. He presented with progressively worsening altered mental status changes confusion and agitation with falls.  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 abscess) despite negative blood cultures with supportive care. His symptoms was said to have improved and was eventually discharged 10/30/2016 to Nursing Home.  However back at the nursing home he continued to have intermittent confusion with agitation, visual hallucinations as well as gait instability, several falls, hitting his head on the day of admission. He has some intermittent treatment with ativan. CT of the head was negative and had mild leukocytosis of 11.7 K. He was transferred to [facility] from [facility]  for consideration of EEG.    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 - He is prone to seizures due to encephalomalacia from tumor resections, but well controlled on seizure medications - Currently at baseline mental status, confirmed by son at the bedside - EEG on 1/20: normal EEG  - Continue Keppra  Active Problems:  Acute respiratory failure with hypoxia ([place]) likely due to new onset acute systolic CHF - Worsening, patient on Ventimask this morning - On 1/21, patient was noted to have hypoxia with shortness of breath, chest x-ray showed pulmonary edema - BNP was elevated at 2245, troponin elevated at 0.18->0.19->0.19 - Patient was placed on Lasix, 2-D echo showed EF of 20-25% with grade 1 diastolic dysfunction -  prior echo in 10/17 has EF 50-55% -  Cardiology was consulted, patient was seen by Dr. Johnsie Cancel. Continue Lasix, changed to oral until he can get IV access. Unfortunately no peripheral IV access could be established despite multiple attempts, family requested increase oral Lasix today and hold off on the PICC line.  - Per cardiology added beta blocker and ACE inhibitor - Still on Ventimask, wean O2 as tolerated. D-dimer is 3.0, check CT angiogram chest to rule out PE. Negative balance of 2.5 L Addendum:  -  CTA negative for pulmonary embolism.  Bilateral upper lobe airspace opacity concerning for possible pnumonia. Left lower lobe airspace opacity concerning for atelectasis or PNA. Will place on augmentin for 7 days.     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 DC in a.m. if bed available, if able to wean off Ventimask and CT angiogram of the chest is negative  Time Spent in minutes  25 minutes  Procedures:  2-D echo  Consultants:   Cardiology  Antimicrobials:      Medications  Scheduled Meds: . iopamidol      . carvedilol  6.25 mg Oral BID WC  . dexamethasone  4 mg Oral Q breakfast  . furosemide  40 mg Oral Q8H  . gabapentin  300 mg Oral TID  . insulin aspart  0-20 Units Subcutaneous TID WC  . insulin aspart  0-5 Units Subcutaneous QHS  . losartan  25 mg Oral Daily  . pantoprazole  40 mg Oral QHS  . polyethylene glycol  17 g Oral BID  . simvastatin  20 mg Oral QPM  . sodium chloride flush  3 mL Intravenous Q12H  . tamsulosin  0.4 mg Oral Daily   Continuous Infusions: PRN Meds:.acetaminophen **OR** acetaminophen, LORazepam, morphine injection, ondansetron **OR** ondansetron (ZOFRAN) IV, prochlorperazine   Antibiotics   Anti-infectives    None        Subjective:   [name] was seen and examined today. Feels somewhat better however still on Ventimask.  Pain in the left lower rib area. Patient denies dizziness, abdominal pain, N/V/D/C, new weakness, numbess, tingling. No acute events overnight.    Objective:   Vitals:   11/06/16 2207 11/07/16 0151 11/07/16 0501 11/07/16 0900  BP: 118/75 113/67 133/80 125/64  Pulse: 70 71 69 74  Resp: 20 20 20 18   Temp: 98.7 F (37.1 C) 97.7 F (36.5 C) 98 F (36.7 C) 98.2 F (36.8 C)  TempSrc: Axillary Axillary Axillary Axillary  SpO2: 100% 100% 100% 100%  Weight:      Height:        Intake/Output Summary (Last 24 hours) at 11/07/16 1320 Last data filed at 11/07/16 0501  Gross per 24 hour  Intake              500 ml  Output             3000 ml    Net            -2500 ml     Wt Readings from Last 3 Encounters:  11/02/16 75.1 kg (165 lb 9.6 oz)  10/18/16 73.2 kg (161 lb 4.8 oz)  10/04/16 72.6 kg (160 lb)     Exam  General: Alert and oriented x 3, NAD, Ventimask  HEENT:    Neck: Supple,  Cardiovascular: S1 S2Clear, RRR  Respiratory: Diminished breath sounds at the bases  Gastrointestinal: Soft, nontender,  nondistended, + bowel sounds  Ext: no cyanosis clubbing or edema  Neuro: no new deficits  Skin: No rashes  Psych: Normal affect and demeanor, alert and oriented x3    Data Reviewed:  I have personally reviewed following labs and imaging studies  Micro Results No results found for this or any previous visit (from the past 240 hour(s)).  Radiology Reports Dg Chest 1 View  Result Date: 11/02/2016 CLINICAL DATA:  Mental status changes. Fall today. History of glioblastoma of brain. EXAM: CHEST 1 VIEW COMPARISON:  02/03/2016 FINDINGS: The heart size is within normal limits. There is stable ectasia of the thoracic aorta. Lung volumes are low bilaterally. There is no evidence of pulmonary edema, consolidation, pneumothorax, nodule or pleural fluid. The visualized skeletal structures are unremarkable. IMPRESSION: Low bilateral lung volumes.  No active disease. Electronically Signed   By: Aletta Edouard M.D.   On: 11/02/2016 15:48   Ct Head Wo Contrast  Result Date: 11/02/2016 CLINICAL DATA:  Altered mental status with slurred speech and dysphagia. Recent fall. Initial encounter. EXAM: CT HEAD WITHOUT CONTRAST TECHNIQUE: Contiguous axial images were obtained from the base of the skull through the vertex without intravenous contrast. COMPARISON:  Head CT and MRI 10/26/2016 FINDINGS: Brain: There is no evidence of acute infarct, acute intracranial hemorrhage, midline shift, or sizable extra-axial fluid collection. Postsurgical changes are again seen in the right parietal lobe with a resection cavity and surrounding  low-density in the white matter as well as some adjacent calcification, not significantly changed from the recent prior CT and more fully evaluated on the recent MRI. Postsurgical changes are also noted in the posterior fossa with mild encephalomalacia and gliosis in the left cerebellum. There is mild global cerebral atrophy. Confluent hypodensities in the cerebral white matter bilaterally are unchanged. Vascular: Minimal calcified atherosclerosis involving the carotid siphons. No hyperdense vessel. Skull: Prior right parietal craniotomy and left retromastoid craniectomy. Sinuses/Orbits: Trace paranasal sinus mucosal thickening. Clear mastoid air cells. Unremarkable orbits. Other: None. IMPRESSION: 1. No evidence of acute intracranial abnormality. 2. Post treatment changes for glioblastoma, more fully evaluated on recent MRI. Electronically Signed   By: Logan Bores M.D.   On: 11/02/2016 17:07   Dg Chest Port 1 View  Result Date: 11/05/2016 CLINICAL DATA:  Chest pain radiating to the left. EXAM: PORTABLE CHEST 1 VIEW COMPARISON:  Single-view of the chest 11/04/2016 and 11/02/2016. FINDINGS: There has been marked progression of bilateral airspace disease over the past 3 days. Small bilateral pleural effusions are seen. Heart size is normal. No pneumothorax. IMPRESSION: Progressive bilateral airspace disease has an appearance most compatible with pulmonary edema. Electronically Signed   By: Inge Rise M.D.   On: 11/05/2016 09:15   Dg Chest Port 1 View  Result Date: 11/04/2016 CLINICAL DATA:  Tachycardia and shortness of breath beginning last night. Chest pain. EXAM: PORTABLE CHEST 1 VIEW COMPARISON:  11/02/2016. FINDINGS: Heart size is at the upper limits of normal. There is some tortuosity of the aorta. There is pulmonary venous hypertension with early pulmonary edema. No consolidation or collapse. No visible effusion. 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 Electronically  Signed   By: Nelson Chimes M.D.   On: 11/04/2016 09:37    Lab Data:  CBC:  Recent Labs Lab 11/02/16 1507 11/03/16 0003 11/06/16 0312 11/07/16 0653  WBC 11.7* 9.8 12.2* 9.6  HGB 10.0* 9.3* 10.3* 9.3*  HCT 28.7* 27.4* 30.2* 27.0*  MCV 99.3 99.3 98.7 97.8  PLT 146* 124* 153  [id number]*   Basic Metabolic Panel:  Recent Labs Lab 11/02/16 1507 11/03/16 0003 11/06/16 0312 11/07/16 0423  NA 145 145 145 142  K 4.5 3.7 3.4* 3.2*  CL 111 114* 110 109  CO2 24 24 22 25   GLUCOSE [id number]* 116* 144* 139*  BUN 19 14 17  23*  CREATININE 0.97 0.89 1.11 1.07  CALCIUM 9.4 8.8* 8.7* 8.5*   GFR: Estimated Creatinine Clearance: 54.9 mL/min (by C-G formula based on SCr of 1.07 mg/dL). Liver Function Tests:  Recent Labs Lab 11/02/16 1507  AST 25  ALT 27  ALKPHOS 36*  BILITOT 1.1  PROT 6.5  ALBUMIN 3.9   No results for input(s): LIPASE, AMYLASE in the last 168 hours. No results for input(s): AMMONIA in the last 168 hours. Coagulation Profile: No results for input(s): INR, PROTIME in the last 168 hours. Cardiac Enzymes:  Recent Labs Lab 11/04/16 1801 11/04/16 2226 11/05/16 0439  TROPONINI 0.18* 0.19* 0.19*   BNP (last 3 results) No results for input(s): PROBNP in the last 8760 hours. HbA1C: No results for input(s): HGBA1C in the last 72 hours. CBG:  Recent Labs Lab 11/06/16 1124 11/06/16 1612 11/06/16 2205 11/07/16 0650 11/07/16 1112  GLUCAP 149* 242* 222* 127* 190*   Lipid Profile: No results for input(s): CHOL, HDL, LDLCALC, TRIG, CHOLHDL, LDLDIRECT in the last 72 hours. Thyroid Function Tests: No results for input(s): TSH, T4TOTAL, FREET4, T3FREE, THYROIDAB in the last 72 hours. Anemia Panel: No results for input(s): VITAMINB12, FOLATE, FERRITIN, TIBC, IRON, RETICCTPCT in the last 72 hours. Urine analysis:    Component Value Date/Time   COLORURINE YELLOW 11/02/2016 1442   APPEARANCEUR CLEAR 11/02/2016 1442   LABSPEC >1.030 (H) 11/02/2016 1442   PHURINE 5.5  11/02/2016 1442   GLUCOSEU NEGATIVE 11/02/2016 1442   HGBUR TRACE (A) 11/02/2016 1442   BILIRUBINUR NEGATIVE 11/02/2016 1442   KETONESUR NEGATIVE 11/02/2016 1442   PROTEINUR 100 (A) 11/02/2016 1442   NITRITE NEGATIVE 11/02/2016 1442   LEUKOCYTESUR NEGATIVE 11/02/2016 1442     Heyli Min M.D. [facility] 11/07/2016, 1:20 PM  Pager: [phone] Between 7am to 7pm - call Pager - [phone]  After 7pm go to [email or web address] - password [email or web address]  Call night coverage person covering after 7pm

## 2016-11-07 NOTE — Progress Notes (Signed)
SLP Cancellation Note  Patient Details Name: Phillip Mckay MRN: [id number] DOB: 12/30/1940   Cancelled treatment:       Reason Eval/Treat Not Completed: Patient at procedure or test/unavailable - pt leaving room for test, family comments that pt is consuming all of puree trays without overt s/s of aspiration.   Ellia Knowlton B. Rutherford Nail, M.S., CCC-SLP Speech-Language Pathologist  Cinnamon Morency 11/07/2016, 2:51 PM

## 2016-11-07 NOTE — NC FL2 (Signed)
[place] LEVEL OF CARE SCREENING TOOL     IDENTIFICATION  Patient Name: Phillip Mckay Birthdate: [birth year] Sex: male Admission Date (Current Location): 11/02/2016  [facility] and [place] Number:   ([place])   Facility and Address:  [facility] [place], [place] [address] [place], [place] [address]      Provider Number: [id number]  Attending Physician Name and Address:  Mendel Corning, MD  Relative Name and Phone Number:       Current Level of Care: Hospital Recommended Level of Care: [place] Prior Approval Number:    Date Approved/Denied:   PASRR Number:    Discharge Plan: SNF    Current Diagnoses: Patient Active Problem List   Diagnosis Date Noted  . Abnormal MRI   . Acute encephalopathy 11/05/2016  . Acute respiratory failure with hypoxia ([place]) 11/05/2016  . Elevated troponin 11/05/2016  . PRES (posterior reversible encephalopathy syndrome)   . GBM (glioblastoma multiforme) ([place])   . Left hemiparesis ([place])   . Altered mental status 11/02/2016  . Low blood potassium 04/16/2016  . 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 Benign essential HTN 04/02/2016  . Thrombocytopenia ([place]) 03/19/2016  . Steroid withdrawal syndrome ([place]) 02/20/2016  . Hypoglycemia 02/13/2016  . 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 Candida infection of mouth 11/21/2015  . Avitaminosis D 10/29/2015  . Glioblastoma multiforme of brain ([place]) 10/27/2015  . Type 2 diabetes mellitus ([place]) 10/22/2015  . 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 Dyslipidemia 10/19/2015  . Acid reflux 10/19/2015  . Cerebral hemorrhage ([place]) 10/19/2015  . BP (high blood pressure) 10/19/2015  . Fungal infection of nail 09/14/2015  . Athlete's foot 09/14/2015  . Elevated fasting blood sugar 08/27/2015  . Carotid artery disease ([place]) 08/24/2015  . Diabetes mellitus ([place]) 08/24/2015  . Left ventricular hypertrophy  11/14/2010  . Essential (primary) hypertension 04/04/2009  . Temporary cerebral vascular dysfunction 04/04/2009  . Phlebitis of leg, superficial 03/24/2007  . Varicose veins of lower extremities with inflammation 03/24/2007  . Calculus of kidney 06/19/2006  . Generalized OA 03/08/2003  . Displacement of cervical intervertebral disc without myelopathy 02/02/2002  . Hypercholesterolemia 02/02/2002  . Herniation of intervertebral disc of cervical region 02/02/2002  . Benign neoplasm of cranial nerve ([place]) 07/03/1999    Orientation RESPIRATION BLADDER Height & Weight     Self, Time, Situation, Place  O2 Incontinent Weight: 75.1 kg (165 lb 9.6 oz) Height:  5\' 7"  (170.2 cm)  BEHAVIORAL SYMPTOMS/MOOD NEUROLOGICAL BOWEL NUTRITION STATUS      Continent Diet (Please see DC Summary)  AMBULATORY STATUS COMMUNICATION OF NEEDS Skin   Extensive Assist Verbally Normal                       Personal Care Assistance Level of Assistance  Bathing, Feeding, Dressing Bathing Assistance: Maximum assistance Feeding assistance: Independent Dressing Assistance: Limited assistance     Functional Limitations Info             SPECIAL CARE FACTORS FREQUENCY  PT (By licensed PT)     PT Frequency: not yet assessed              Contractures      Additional Factors Info  Code Status, Allergies, Insulin Sliding Scale Code Status Info: DNR Allergies Info: NKA   Insulin Sliding Scale Info: 3x daily and at bedtim       Current Medications (11/07/2016):  This is  the current hospital active medication list Current Facility-Administered Medications  Medication Dose Route Frequency Provider Last Rate Last Dose  . iopamidol (ISOVUE-370) 76 % injection           . acetaminophen (TYLENOL) tablet 650 mg  650 mg Oral Q6H PRN Tanna Savoy Stinson, DO   650 mg at 11/05/16 [id number]   Or  . acetaminophen (TYLENOL) suppository 650 mg  650 mg Rectal Q6H PRN Tanna Savoy Stinson, DO      . carvedilol (COREG)  tablet 6.25 mg  6.25 mg Oral BID WC Josue Hector, MD   6.25 mg at 11/07/16 0908  . dexamethasone (DECADRON) tablet 4 mg  4 mg Oral Q breakfast Truett Mainland, DO   4 mg at 11/07/16 [id number]  . furosemide (LASIX) tablet 40 mg  40 mg Oral Q8H Ripudeep K Rai, MD   40 mg at 11/07/16 0615  . gabapentin (NEURONTIN) capsule 300 mg  300 mg Oral TID Tanna Savoy Stinson, DO   300 mg at 11/07/16 [id number]  . insulin aspart (novoLOG) injection 0-20 Units  0-20 Units Subcutaneous TID WC Truett Mainland, DO   4 Units at 11/07/16 1209  . insulin aspart (novoLOG) injection 0-5 Units  0-5 Units Subcutaneous QHS Truett Mainland, DO   2 Units at 11/06/16 2238  . LORazepam (ATIVAN) injection 1 mg  1 mg Intravenous Q6H PRN Benito Mccreedy, MD      . losartan (COZAAR) tablet 25 mg  25 mg Oral Daily Josue Hector, MD   25 mg at 11/07/16 0908  . morphine 2 MG/ML injection 1 mg  1 mg Intravenous Q3H PRN Ripudeep Krystal Eaton, MD   1 mg at 11/07/16 0429  . ondansetron (ZOFRAN) tablet 4 mg  4 mg Oral Q6H PRN Tanna Savoy Stinson, DO       Or  . ondansetron [facility]) injection 4 mg  4 mg Intravenous Q6H PRN Tanna Savoy Stinson, DO      . pantoprazole (PROTONIX) EC tablet 40 mg  40 mg Oral QHS Benito Mccreedy, MD   40 mg at 11/06/16 2226  . polyethylene glycol (MIRALAX / GLYCOLAX) packet 17 g  17 g Oral BID Ripudeep Krystal Eaton, MD   17 g at 11/07/16 0908  . prochlorperazine (COMPAZINE) tablet 10 mg  10 mg Oral Q6H PRN Tanna Savoy Stinson, DO      . simvastatin (ZOCOR) tablet 20 mg  20 mg Oral QPM Tanna Savoy Stinson, DO   20 mg at 11/06/16 1806  . sodium chloride flush (NS) 0.9 % injection 3 mL  3 mL Intravenous Q12H Tanna Savoy Stinson, DO   3 mL at 11/07/16 [id number]  . tamsulosin (FLOMAX) capsule 0.4 mg  0.4 mg Oral Daily Tanna Savoy Stinson, DO   0.4 mg at 11/07/16 0908     Discharge Medications: Please see discharge summary for a list of discharge medications.  Relevant Imaging Results:  Relevant Lab Results:   Additional Information SSN: [place], [place]

## 2016-11-07 NOTE — Progress Notes (Signed)
Patient ID: Phillip Mckay, male   DOB: [birth year], 76 y.o.   MRN: [id number]    Subjective:  Denies SSCP, palpitations or Dyspnea   Objective:  Vitals:   11/06/16 1747 11/06/16 2207 11/07/16 0151 11/07/16 0501  BP: 127/74 118/75 113/67 133/80  Pulse: 85 70 71 69  Resp: 20 20 20 20   Temp: 98.2 F (36.8 C) 98.7 F (37.1 C) 97.7 F (36.5 C) 98 F (36.7 C)  TempSrc: Axillary Axillary Axillary Axillary  SpO2: 100% 100% 100% 100%  Weight:      Height:        Intake/Output from previous day:  Intake/Output Summary (Last 24 hours) at 11/07/16 0915 Last data filed at 11/07/16 0501  Gross per 24 hour  Intake              500 ml  Output             3000 ml  Net            -2500 ml    Physical Exam: Affect appropriate Chronically ill pale white male  HEENT:post craniotomy  Neck supple with no adenopathy JVP normal no bruits no thyromegaly Lungs clear with no wheezing and good diaphragmatic motion Heart:  S1/S2 no murmur, no rub, gallop or click PMI normal Abdomen: benighn, BS positve, no tenderness, no AAA no bruit.  No HSM or HJR Distal pulses intact with no bruits No edema Neuro non-focal Skin warm and dry No muscular weakness   Lab Results: Basic Metabolic Panel:  Recent Labs  11/06/16 0312 11/07/16 0423  NA 145 142  K 3.4* 3.2*  CL 110 109  CO2 22 25  GLUCOSE 144* 139*  BUN 17 23*  CREATININE 1.11 1.07  CALCIUM 8.7* 8.5*   CBC:  Recent Labs  11/06/16 0312 11/07/16 0653  WBC 12.2* 9.6  HGB 10.3* 9.3*  HCT 30.2* 27.0*  MCV 98.7 97.8  PLT 153 129*   Cardiac Enzymes:  Recent Labs  11/04/16 1801 11/04/16 2226 11/05/16 0439  TROPONINI 0.18* 0.19* 0.19*    Imaging: No results found.  Cardiac Studies:  ECG:  Orders placed or performed during the hospital encounter of 11/02/16  . ED EKG  . EKG 12-Lead  . EKG 12-Lead  . EKG     Telemetry: NSR rates 90-100  Echo: EF 20-25%   Medications:   . carvedilol  6.25 mg Oral BID  WC  . dexamethasone  4 mg Oral Q breakfast  . furosemide  40 mg Oral Q8H  . gabapentin  300 mg Oral TID  . insulin aspart  0-20 Units Subcutaneous TID WC  . insulin aspart  0-5 Units Subcutaneous QHS  . losartan  25 mg Oral Daily  . pantoprazole  40 mg Oral QHS  . polyethylene glycol  17 g Oral BID  . simvastatin  20 mg Oral QPM  . sodium chloride flush  3 mL Intravenous Q12H  . tamsulosin  0.4 mg Oral Daily      Assessment/Plan:  CHF:  New onset likely from avastin improved Medical Rx adjusted supplement K not a candidate For cath or intervention  Glioblastoma: post resection with recurrence and chemo with tamador and avastin. 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  Ok to d.c from cardiology standpoint His prognosis is determined not by  his heart but his metastatic brain cancer DCM is Possibly reversable  If due to avastin   Jenkins Rouge 11/07/2016, 9:15 AM

## 2016-11-07 NOTE — Progress Notes (Signed)
Physical Therapy Treatment Patient Details Name: Phillip Mckay MRN: [id number] DOB: Mar 09, 1941 Today's Date: 11/07/2016    History of Present Illness Pt is a 76 y.o.malewith a history of glioblastoma who presents with AMS that started earlier this week. He has been confused and having visual hallucinations and was admitted to [facility]. 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 He apparently had some improvement with medical therapy, however he has continued to have visual hallucinations and gait instability and has had falls at his nursing home. Due to falls he was brought back to [facility], and transferred to [facility] for consideration of the EEG.    PT Comments    Pt progressig towrards physical therapy goals. Was able to tolerate EOB pre-gait activity with min assist for balance support. Pt on venti mask and satting at 100% throughout session. Per RN, not appropriate to check sats on less oxygen support at this time. Will continue to follow.   Follow Up Recommendations  SNF;Supervision/Assistance - 24 hour     Equipment Recommendations  Other (comment) (TBD by next venue of care)    Recommendations for Other Services       Precautions / Restrictions Precautions Precautions: Fall Precaution Comments: Has had several falls since last admission. L rib pain - family feels he may have fractures but x-rays were apparently inconclusive Restrictions Weight Bearing Restrictions: No    Mobility  Bed Mobility Overal bed mobility: Needs Assistance Bed Mobility: Supine to Sit     Supine to sit: Min assist     General bed mobility comments: Assist for trunk elevation to full sitting position. Increased time to scoot out to EOB.   Transfers Overall transfer level: Needs assistance Equipment used: Rolling walker (2 wheeled) Transfers: Sit to/from Stand Sit to Stand: Min assist;+2 physical assistance         General transfer comment: Pt was  able to power-up to full standing position with min assist for balance support and safety. VC's for hand placement on seated surface for safety.   Ambulation/Gait             General Gait Details: Pt was able to complete pre-gait activity EOB. Marching in place, and stepping into tandem and back feet together were attempted.    Stairs            Wheelchair Mobility    Modified Rankin (Stroke Patients Only)       Balance Overall balance assessment: Needs assistance Sitting-balance support: Feet supported;No upper extremity supported Sitting balance-Leahy Scale: Fair     Standing balance support: Bilateral upper extremity supported;During functional activity Standing balance-Leahy Scale: Poor                      Cognition Arousal/Alertness: Awake/alert Behavior During Therapy: WFL for tasks assessed/performed Overall Cognitive Status: Impaired/Different from baseline Area of Impairment: Orientation;Memory;Awareness;Problem solving Orientation Level: Disoriented to;Time   Memory: Decreased short-term memory     Awareness: Emergent Problem Solving: Slow processing;Decreased initiation;Difficulty sequencing General Comments: Difficulty sequencing feeding activities and noted holding spoon inefficiently. Able to recall 2/3 words with delayed recall task. Reporting that the year is 2000.     Exercises      General Comments        Pertinent Vitals/Pain Pain Assessment: Faces Faces Pain Scale: Hurts little more Pain Location: L rib during movement Pain Descriptors / Indicators: Sharp;Theba  Prior Function            PT Goals (current goals can now be found in the care plan section) Acute Rehab PT Goals Patient Stated Goal: Decrease pain PT Goal Formulation: With patient Time For Goal Achievement: 11/19/16 Potential to Achieve Goals: Fair Progress towards PT goals: Progressing toward goals     Frequency    Min 3X/week      PT Plan Current plan remains appropriate    Co-evaluation             End of Session Equipment Utilized During Treatment: Gait belt (Around chest due to L rib pain) Activity Tolerance: Patient limited by pain;Patient limited by fatigue (weakness) Patient left: in chair;with chair alarm set;with call bell/phone within reach;with family/visitor present     Time: 1315-1350 PT Time Calculation (min) (ACUTE ONLY): 35 min  Charges:  $Gait Training: 8-22 mins                    G Codes:      [name] 27-Nov-2016, 2:54 PM  Rolinda Roan, PT, DPT Acute Rehabilitation Services Pager: [phone]

## 2016-11-08 LAB — CBC
HCT: 27.4 % — ABNORMAL LOW (ref 39.0–52.0)
Hemoglobin: 9.2 g/dL — ABNORMAL LOW (ref 13.0–17.0)
MCH: 33.1 pg (ref 26.0–34.0)
MCHC: 33.6 g/dL (ref 30.0–36.0)
MCV: 98.6 fL (ref 78.0–100.0)
Platelets: 143 10*3/uL — ABNORMAL LOW (ref 150–400)
RBC: 2.78 MIL/uL — ABNORMAL LOW (ref 4.22–5.81)
RDW: 14.5 % (ref 11.5–15.5)
WBC: 10.5 10*3/uL (ref 4.0–10.5)

## 2016-11-08 LAB — BASIC METABOLIC PANEL
Anion gap: 8 (ref 5–15)
BUN: 24 mg/dL — ABNORMAL HIGH (ref 6–20)
CALCIUM: 8.7 mg/dL — AB (ref 8.9–10.3)
CO2: 31 mmol/L (ref 22–32)
CREATININE: 1.12 mg/dL (ref 0.61–1.24)
Chloride: 102 mmol/L (ref 101–111)
GFR calc non Af Amer: >60 mL/min (ref >60)
Glucose, Bld: 191 mg/dL — ABNORMAL HIGH (ref 65–99)
Potassium: 4 mmol/L (ref 3.5–5.1)
Sodium: 141 mmol/L (ref 135–145)

## 2016-11-08 LAB — GLUCOSE, CAPILLARY
GLUCOSE-CAPILLARY: 150 mg/dL — AB (ref 65–99)
Glucose-Capillary: 179 mg/dL — ABNORMAL HIGH (ref 65–99)

## 2016-11-08 MED ORDER — INSULIN ASPART 100 UNIT/ML ~~LOC~~ SOLN: 0.0000 [IU] | Freq: Three times a day (TID) | SUBCUTANEOUS | 11 refills | Status: AC

## 2016-11-08 MED ORDER — CARVEDILOL 6.25 MG PO TABS: 6.2500 mg | ORAL_TABLET | Freq: Two times a day (BID) | ORAL | Status: AC

## 2016-11-08 MED ORDER — ONDANSETRON HCL 4 MG PO TABS: 4.0000 mg | ORAL_TABLET | Freq: Four times a day (QID) | ORAL | 0 refills | Status: AC | PRN

## 2016-11-08 MED ORDER — FUROSEMIDE 40 MG PO TABS: 40.0000 mg | ORAL_TABLET | Freq: Two times a day (BID) | ORAL | Status: AC

## 2016-11-08 MED ORDER — LOSARTAN POTASSIUM 25 MG PO TABS: 25.0000 mg | ORAL_TABLET | Freq: Every day | ORAL | Status: AC

## 2016-11-08 MED ORDER — AMOXICILLIN-POT CLAVULANATE 875-125 MG PO TABS: 1.0000 | ORAL_TABLET | Freq: Two times a day (BID) | ORAL | Status: AC

## 2016-11-08 MED ORDER — LORAZEPAM 1 MG PO TABS: 1.0000 mg | ORAL_TABLET | Freq: Two times a day (BID) | ORAL | 0 refills | Status: AC | PRN

## 2016-11-08 MED ORDER — POLYETHYLENE GLYCOL 3350 17 G PO PACK: 17.0000 g | PACK | Freq: Two times a day (BID) | ORAL | 0 refills | Status: AC

## 2016-11-08 MED ORDER — POTASSIUM CHLORIDE CRYS ER 20 MEQ PO TBCR: 20.0000 meq | EXTENDED_RELEASE_TABLET | Freq: Every day | ORAL | Status: AC

## 2016-11-08 NOTE — Progress Notes (Signed)
Pt discharged from hospital per orders from MD. Pt transferred to [facility]. RN called to give report to facility. RN spoke with RN Candace. Pt and family made aware of transfer. Pt's IV's were removed prior to transfer. All questions and concerns were addressed. Pt transferred via stretcher.

## 2016-11-08 NOTE — Clinical Social Work Placement (Signed)
   CLINICAL SOCIAL WORK PLACEMENT  NOTE  Date:  11/08/2016  Patient Details  Name: Phillip Mckay MRN: [id number] Date of Birth: [birth year]  Clinical Social Work is seeking post-discharge placement for this patient at the [place] level of care (*CSW will initial, date and re-position this form in  chart as items are completed):  Yes   Patient/family provided with [place] Work Department's list of facilities offering this level of care within the geographic area requested by the patient (or if unable, by the patient's family).  Yes   Patient/family informed of their freedom to choose among providers that offer the needed level of care, that participate in Medicare, Medicaid or managed care program needed by the patient, have an available bed and are willing to accept the patient.  Yes   Patient/family informed of [facility]'s ownership interest in [facility] and [facility], as well as of the fact that they are under no obligation to receive care at these facilities.  PASRR submitted to EDS on       PASRR number received on       Existing PASRR number confirmed on       FL2 transmitted to all facilities in geographic area requested by pt/family on 11/08/16     FL2 transmitted to all facilities within larger geographic area on       Patient informed that his/her managed care company has contracts with or will negotiate with certain facilities, including the following:        Yes   Patient/family informed of bed offers received.  Patient [facility] bed at [facility] and [facility]     Physician recommends and patient chooses bed at      Patient to be transferred to [facility] on 11/08/16.  Patient to be transferred to facility by [facility]     Patient family notified on 11/08/16 of transfer.  Name of family member notified:  Spouse     PHYSICIAN Please sign FL2     Additional Comment:     _______________________________________________ [name], [place] 11/08/2016, 10:20 AM

## 2016-11-08 NOTE — Progress Notes (Signed)
Patient will DC to: [facility] Anticipated DC date: 11/08/16 Family notified: Spouse Transport by: Corey Harold 12:30pm   Per MD patient ready for DC to [facility]. RN, patient, patient's family, and facility notified of DC. Discharge Summary sent to facility. RN given number for report. DC packet on chart. Ambulance transport requested for patient.   CSW signing off.  Cedric Fishman, [place] Social Worker [phone]

## 2016-11-08 NOTE — Discharge Summary (Signed)
Physician Discharge Summary   Patient ID: Phillip Mckay MRN: [id number] DOB/AGE: Apr 30, 1941 76 y.o.  Admit date: 11/02/2016 Discharge date: 11/08/2016  Primary Care Physician:  Phillip Burly, MD  Discharge Diagnoses:   . Acute encephalopathy . Acute respiratory failure with hypoxia ([place]) . New onset systolic CHF exacerbation likely from Avastin . Bilateral upper lobe pneumonia . Glioblastoma multiforme of brain ([place]) . Essential (primary) hypertension . Acid reflux . Benign essential HTN . Elevated troponin   Dysphagia with aspiration   Consults:  Cardiology Neurology  Recommendations for Outpatient Follow-up:  1. Please continue Lasix 40 mg twice a day for 3 days till 11/11/16 Phillip Mckay), then check  BMET on Monday 11/12/16 for renal function and potassium. Lasix may need to be decreased down to 40 mg once a day on Monday, please reassess the patient.  2. Please repeat CBC/BMET at next visit 3. Recommend speech therapy follow the patient had skilled nursing facility, to upgrade diet   DIET: Dysphagia 1 diet with thin liquids   Allergies:  No Known Allergies   DISCHARGE MEDICATIONS: Current Discharge Medication List    START taking these medications   Details  amoxicillin-clavulanate (AUGMENTIN) 875-125 MG tablet Take 1 tablet by mouth 2 (two) times daily. X 7days    carvedilol (COREG) 6.25 MG tablet Take 1 tablet (6.25 mg total) by mouth 2 (two) times daily with a meal.    furosemide (LASIX) 40 MG tablet Take 1 tablet (40 mg total) by mouth 2 (two) times daily. Qty: 30 tablet    insulin aspart (NOVOLOG) 100 UNIT/ML injection Inject 0-20 Units into the skin 3 (three) times daily with meals. Sliding scale insulin correction factor:CBG < 70: Drink juice; CBG 70-120: 0 units ; CBG 121-150: 3 units; CBG 151-200: 4 units; CBG 201-250: 7 units; CBG 251-300: 11 units; CBG 301-350: 15 units ; CBG 351-400: 20 units ; CBG > 400: please notify MD Qty: 10 mL, Refills: 11     losartan (COZAAR) 25 MG tablet Take 1 tablet (25 mg total) by mouth daily.    !! ondansetron (ZOFRAN) 4 MG tablet Take 1 tablet (4 mg total) by mouth every 6 (six) hours as needed for nausea. Qty: 20 tablet, Refills: 0    polyethylene glycol (MIRALAX / GLYCOLAX) packet Take 17 g by mouth 2 (two) times daily. Qty: 14 each, Refills: 0     !! - Potential duplicate medications found. Please discuss with provider.    CONTINUE these medications which have CHANGED   Details  LORazepam (ATIVAN) 1 MG tablet Take 1 tablet (1 mg total) by mouth 2 (two) times daily as needed for anxiety or seizure. Qty: 20 tablet, Refills: 0    potassium chloride SA (KLOR-CON M20) 20 MEQ tablet Take 1 tablet (20 mEq total) by mouth daily.   Associated Diagnoses: Low blood potassium      CONTINUE these medications which have NOT CHANGED   Details  cholecalciferol 2000 units tablet Take 1 tablet (2,000 Units total) by mouth daily. Qty: 30 tablet, Refills: 3   Associated Diagnoses: Glioblastoma multiforme of brain (HCC)    clotrimazole-betamethasone (LOTRISONE) cream Apply 1 application topically 2 (two) times daily. Qty: 30 g, Refills: 0    dexamethasone (DECADRON) 4 MG tablet Take 4 mg by mouth daily.    Docusate Sodium 100 MG capsule Take 100 mg by mouth 2 (two) times daily.     gabapentin (NEURONTIN) 300 MG capsule Take 300 mg by mouth 3 (three) times daily. Reported  on 04/20/2016    latanoprost (XALATAN) 0.005 % ophthalmic solution Place 1 drop into both eyes daily. Reported on 04/20/2016    !! ondansetron (ZOFRAN) 8 MG tablet TAKE 1 TABLET BY MOUTH EVERY 8 HOURS AS NEEDED FOR NAUSEA AND VOMITING Qty: 30 tablet, Refills: 2   Associated Diagnoses: Glioblastoma multiforme of brain (HCC)    Polyethyl Glycol-Propyl Glycol 0.4-0.3 % SOLN Apply 1 drop to eye daily. For dry eye relief    Probiotic Product [facility]) CAPS Take 3 capsules by mouth daily.     prochlorperazine (COMPAZINE) 10 MG tablet Take 1  tablet (10 mg total) by mouth every 6 (six) hours as needed for nausea or vomiting. Reported on 04/20/2016 Qty: 30 tablet, Refills: 2    simvastatin (ZOCOR) 20 MG tablet Take 20 mg by mouth every evening. Reported on 04/20/2016    tamsulosin (FLOMAX) 0.4 MG CAPS capsule Take 0.4 mg by mouth daily.     tetrahydrozoline 0.05 % ophthalmic solution Place 1 drop into both eyes daily. Reported on 04/20/2016    TRAVATAN Z 0.004 % SOLN ophthalmic solution Place 1 drop into both eyes at bedtime.     pantoprazole (PROTONIX) 40 MG tablet TAKE 1 TABLET (40 MG TOTAL) BY MOUTH DAILY. 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 - Potential duplicate medications found. Please discuss with provider.    STOP taking these medications     Bevacizumab (AVASTIN IV)      cefTRIAXone (ROCEPHIN) 1 g injection      insulin lispro (HUMALOG) 100 UNIT/ML injection      levETIRAcetam (KEPPRA) 500 MG tablet      lisinopril (PRINIVIL,ZESTRIL) 40 MG tablet      metFORMIN (GLUCOPHAGE) 1000 MG tablet      omeprazole (PRILOSEC) 20 MG capsule      traMADol (ULTRAM) 50 MG tablet      ziprasidone (GEODON) injection          Brief H and P: For complete details please refer to admission H and P, but in brief  Phillip Mckay a 75 y.o.malewith a complicated medical history including but not limited to right glioblastoma multiforme, grade 4 with partial resection in 10/25/2015, with 80% of the tumor followed by Temodar s/p radiation, with transition to Avastin on 09/20/16 as well as seizure disorder on Keppra followed by Keppra-induced thrombocytopenia with discontinuation of the Keppra on 04/27/16. He presented with progressively worsening altered mental status changes confusion and agitation with falls.  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 abscess) despite negative blood cultures with supportive care. His symptoms was said to have improved and was eventually discharged 10/30/2016 to Nursing Home.  However back at the nursing home he continued to have intermittent confusion with agitation, visual hallucinations as well as gait instability, several falls, hitting his head on the day of admission. He has some intermittent treatment with ativan. CT of the head was negative and had mild leukocytosis of 11.7 K. He was transferred to [facility] from [facility] for consideration of EEG.  [facility] Course:  Patient was admitted with principal diagnoses of progressively worsening acute encephalopathy which has now resolved.  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 He never had any documented seizure. Hence Keppra was discontinued by neurology.    Acute respiratory failure with hypoxia (HCC) likely due to new onset acute systolic CHF - Improved, O2 sats 99% on room air at discharge - On 1/21, patient was noted to have hypoxia with shortness of breath, chest x-ray showed pulmonary edema - BNP was elevated at 2245, troponin elevated at 0.18->0.19->0.19 - Patient was placed on Lasix, 2-D echo showed EF of 20-25% with grade 1 diastolic dysfunction -  prior echo in 10/17 has EF 50-55% -  Cardiology was consulted, patient was seen by Dr. Johnsie Cancel. Patient was placed on Lasix 40 mg every 8 hours, per cardiology added beta blocker and ACE inhibitor - Patient had a slow and  steady improvement and eventually was weaned off off Ventimask, O2 sats 99% on room air at the time of discharge. - CTA negative for pulmonary embolism.  Bilateral upper lobe airspace opacity concerning for possible pnumonia. Left lower lobe airspace opacity concerning for atelectasis or PNA. Placed on augmentin for 7 days.  - Negative balance of 3.9 L at the time of discharge, Lasix decreased to 40 mg twice a day, continue for 3 days and then reassess on Monday with labs on 1/29, Lasix may need to be changed to 40mg  daily  Aspiration pneumonia - CT angiogram of the chest showed bilateral upper opacity concerning for possible pneumonia, left lower lobe airspace opacity concerning for atelectasis or pneumonia. Speech therapy followed the patient closely and recommended dysphagia 1 diet. Placed on Augmentin for 7 days. - Recommend speech therapy follow the patient closely at the facility to upgrade diet as tolerated    Elevated troponin - Likely due to new onset acute systolic CHF caused by Avastin    Diabetes mellitus (HCC) - CBG stable, continue sliding scale insulin    Benign essential HTN - Currently stable. 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 Keppra was discontinued by neurology   Fall with generalized deconditioning - PT OT evaluation recommended skilled nursing facility    Day of Discharge BP 117/66 (BP Location: Right Arm)   Pulse 68   Temp 98.2 F (36.8 C) (Oral)   Resp 18   Ht 5\' 7"  (1.702 m)   Wt 75.1 kg (165 lb 9.6 oz)   SpO2 99%   BMI 25.94 kg/m   Physical Exam: General: Alert and awake oriented x3 not in any acute distress. HEENT: anicteric sclera, pupils reactive to light and accommodation CVS: S1-S2 clear no murmur rubs or gallops Chest: clear to auscultation bilaterally, no wheezing rales or rhonchi Abdomen: soft nontender, nondistended, normal bowel  sounds Extremities: no cyanosis, clubbing or edema noted bilaterally Neuro: Cranial nerves II-XII intact, no focal neurological deficits   The results of significant diagnostics from this hospitalization (including imaging, microbiology, ancillary and laboratory) are listed below for reference.    LAB RESULTS: Basic Metabolic Panel:  Recent Labs Lab 11/07/16 0423 11/08/16 0411  NA 142 141  K 3.2* 4.0  CL 109 102  CO2 25 31  GLUCOSE 139* 191*  BUN 23* 24*  CREATININE 1.07 1.12  CALCIUM 8.5*  8.7*   Liver Function Tests:  Recent Labs Lab 11/02/16 1507  AST 25  ALT 27  ALKPHOS 36*  BILITOT 1.1  PROT 6.5  ALBUMIN 3.9   No results for input(s): LIPASE, AMYLASE in the last 168 hours. No results for input(s): AMMONIA in the last 168 hours. CBC:  Recent Labs Lab 11/07/16 0653 11/08/16 0411  WBC 9.6 10.5  HGB 9.3* 9.2*  HCT 27.0* 27.4*  MCV 97.8 98.6  PLT 129* 143*   Cardiac Enzymes:  Recent Labs Lab 11/04/16 2226 11/05/16 0439  TROPONINI 0.19* 0.19*   BNP: Invalid input(s): POCBNP CBG:  Recent Labs Lab 11/07/16 2116 11/08/16 0555  GLUCAP 255* 150*    Significant Diagnostic Studies:  Dg Chest Port 1 View  Result Date: 11/04/2016 CLINICAL DATA:  Tachycardia and shortness of breath beginning last night. Chest pain. EXAM: PORTABLE CHEST 1 VIEW COMPARISON:  11/02/2016. FINDINGS: Heart size is at the upper limits of normal. There is some tortuosity of the aorta. There is pulmonary venous hypertension with early pulmonary edema. No consolidation or collapse. No visible effusion. 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 Electronically Signed   By: Nelson Chimes M.D.   On: 11/04/2016 09:37    2D ECHO:  Study Conclusions  - Left ventricle: The cavity size was normal. There was mild focal   basal hypertrophy of the septum. Systolic function was severely   reduced. The estimated ejection fraction was in the range of 20%   to 25%.  Wall motion was normal; there were no regional wall   motion abnormalities. Doppler parameters are consistent with   abnormal left ventricular relaxation (grade 1 diastolic   dysfunction). Doppler parameters are consistent with elevated   ventricular end-diastolic filling pressure. - Aortic valve: Trileaflet; normal thickness leaflets. There was no   regurgitation. - Aortic root: The aortic root was normal in size. - Mitral valve: There was mild regurgitation. - Left atrium: The atrium was normal in size. - Right ventricle: Systolic function was normal. - Tricuspid valve: There was mild regurgitation. - Pulmonic valve: There was no regurgitation. - Pulmonary arteries: Systolic pressure was within the normal   range. - Inferior vena cava: The vessel was normal in size. - Pericardium, extracardiac: There was no pericardial effusion.  Impressions:  - There is severe systolic dysfunction LVEF 25-30% with diffuse   hypokinesis and akinesis in the mid inferoseptal, anteroseptal,   distal septal and apical walls.  Disposition and Follow-up: Discharge Instructions    Increase activity slowly    Complete by:  As directed        DISPOSITION:  SNF   DISCHARGE FOLLOW-UP Follow-up Information    Phillip Burly, MD. Schedule an appointment as soon as possible for a visit in 2 week(s).   Specialty:  Internal Medicine Contact information: [address] [place] [id number] [phone]        Carlyle Dolly, MD. Schedule an appointment as soon as possible for a visit in 2 week(s).   Specialty:  Cardiology Contact information: [address] [place] [address] [phone]            Time spent on Discharge: 35 mins   Signed:   Woodfin Kiss M.D. [facility] 11/08/2016, 10:30 AM Pager: [phone]

## 2016-11-08 NOTE — Progress Notes (Signed)
Patient ID: Phillip Mckay, male   DOB: 04-15-1941, 76 y.o.   MRN: [id number]    Subjective:  Denies SSCP, palpitations or Dyspnea   Objective:  Vitals:   11/07/16 1700 11/07/16 2315 11/08/16 0121 11/08/16 0602  BP: 134/76 (!) 144/88 (!) 146/93 137/89  Pulse: 79 74 84 81  Resp: 18 18 18 16   Temp: 97.4 F (36.3 C) 98.6 F (37 C) 98 F (36.7 C) 98 F (36.7 C)  TempSrc: Axillary Oral Oral Oral  SpO2: 97% 96% 98% 98%  Weight:      Height:        Intake/Output from previous day:  Intake/Output Summary (Last 24 hours) at 11/08/16 [id number] Last data filed at 11/07/16 1807  Gross per 24 hour  Intake                0 ml  Output             1650 ml  Net            -1650 ml    Physical Exam: Affect appropriate Chronically ill pale white male  HEENT:post craniotomy  Neck supple with no adenopathy JVP normal no bruits no thyromegaly Lungs clear with no wheezing and good diaphragmatic motion Heart:  S1/S2 no murmur, no rub, gallop or click PMI normal Abdomen: benighn, BS positve, no tenderness, no AAA no bruit.  No HSM or HJR Distal pulses intact with no bruits No edema Neuro non-focal Skin warm and dry No muscular weakness   Lab Results: Basic Metabolic Panel:  Recent Labs  11/07/16 0423 11/08/16 0411  NA 142 141  K 3.2* 4.0  CL 109 102  CO2 25 31  GLUCOSE 139* 191*  BUN 23* 24*  CREATININE 1.07 1.12  CALCIUM 8.5* 8.7*   CBC:  Recent Labs  11/07/16 0653 11/08/16 0411  WBC 9.6 10.5  HGB 9.3* 9.2*  HCT 27.0* 27.4*  MCV 97.8 98.6  PLT 129* 143*   Cardiac Enzymes: No results for input(s): CKTOTAL, CKMB, CKMBINDEX, TROPONINI in the last 72 hours.  Imaging: Ct Angio Chest Pe W Or Wo Contrast  Result Date: 11/07/2016 CLINICAL DATA:  Hypoxia. EXAM: CT ANGIOGRAPHY CHEST WITH CONTRAST TECHNIQUE: Multidetector CT imaging of the chest was performed using the standard protocol during bolus administration of intravenous contrast. Multiplanar CT image  reconstructions and MIPs were obtained to evaluate the vascular anatomy. CONTRAST:  100 mL of Isovue 370 intravenously. COMPARISON:  Radiograph of November 05, 2016. FINDINGS: Cardiovascular: Satisfactory opacification of the pulmonary arteries to the segmental level. No evidence of pulmonary embolism. Normal heart size. Minimal pericardial effusion. Coronary artery calcifications are noted. Mediastinum/Nodes: No enlarged mediastinal, hilar, or axillary lymph nodes. Thyroid gland, trachea, and esophagus demonstrate no significant findings. Lungs/Pleura: No pneumothorax is noted. Moderate bilateral pleural effusions are noted. Bilateral upper lobe airspace opacities are noted concerning for possible pneumonia. Left lower lobe airspace opacity is noted concerning for atelectasis or pneumonia. Upper Abdomen: No acute abnormality. Musculoskeletal: No chest wall abnormality. No acute or significant osseous findings. Review of the MIP images confirms the above findings. IMPRESSION: Coronary artery calcifications are noted suggesting coronary artery disease. Minimal pericardial effusion. No definite evidence of pulmonary embolus. Moderate bilateral pleural effusions. Bilateral upper lobe airspace opacity is noted concerning for possible pneumonia. Left lower lobe airspace opacity is noted concerning for atelectasis or pneumonia. Electronically Signed   By: Marijo Conception, M.D.   On: 11/07/2016 15:21    Cardiac Studies:  ECG:  Orders placed or performed during the hospital encounter of 11/02/16  . ED EKG  . EKG 12-Lead  . EKG 12-Lead  . EKG     Telemetry: NSR rates 90-100  Echo: EF 20-25%   Medications:   . amoxicillin-clavulanate  1 tablet Oral Q12H  . carvedilol  6.25 mg Oral BID WC  . dexamethasone  4 mg Oral Q breakfast  . furosemide  40 mg Oral Q8H  . gabapentin  300 mg Oral TID  . insulin aspart  0-20 Units Subcutaneous TID WC  . insulin aspart  0-5 Units Subcutaneous QHS  . losartan  25 mg Oral  Daily  . pantoprazole  40 mg Oral QHS  . polyethylene glycol  17 g Oral BID  . simvastatin  20 mg Oral QPM  . sodium chloride flush  3 mL Intravenous Q12H  . tamsulosin  0.4 mg Oral Daily      Assessment/Plan:  CHF:  New onset likely from avastin improved Medical Rx adjusted supplement K not a candidate For cath or intervention  Glioblastoma: post resection with recurrence and chemo with tamador and avastin. 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  Ok to d.c from cardiology standpoint His prognosis is determined not by his heart but his metastatic brain cancer DCM is Possibly reversable  If due to avastin  Anticipated d/c to SNF this afternoon  Jenkins Rouge 11/08/2016, 9:03 AM

## 2016-11-08 NOTE — Care Management Note (Signed)
Case Management Note  Patient Details  Name: Nellie Jochum MRN: [id number] Date of Birth: [birth year]  Subjective/Objective:                    Action/Plan: Pt discharging to [facility] today. No further needs per CM.   Expected Discharge Date:  11/08/16               Expected Discharge Plan:  Skilled Nursing Facility  In-House Referral:  Clinical Social Work  Discharge planning Services     Post Acute Care Choice:    Choice offered to:     DME Arranged:    DME Agency:     HH Arranged:    [place] Agency:     Status of Service:  Completed, signed off  If discussed at [employer], dates discussed:    Additional Comments:  Pollie Friar, RN 11/08/2016, 10:40 AM

## 2016-11-10 ENCOUNTER — Encounter (HOSPITAL_COMMUNITY): Payer: Self-pay | Admitting: Hematology & Oncology

## 2016-11-15 ENCOUNTER — Ambulatory Visit (HOSPITAL_COMMUNITY): Payer: Medicare Other | Admitting: Hematology & Oncology

## 2016-11-15 ENCOUNTER — Ambulatory Visit (HOSPITAL_COMMUNITY): Payer: Medicare Other

## 2016-11-15 ENCOUNTER — Other Ambulatory Visit (HOSPITAL_COMMUNITY): Payer: Medicare Other

## 2016-11-22 ENCOUNTER — Ambulatory Visit (HOSPITAL_COMMUNITY): Payer: Medicare Other

## 2016-11-22 ENCOUNTER — Other Ambulatory Visit: Payer: Self-pay | Admitting: Radiation Therapy

## 2016-11-22 ENCOUNTER — Encounter: Payer: Self-pay | Admitting: Adult Health

## 2016-11-22 ENCOUNTER — Ambulatory Visit (INDEPENDENT_AMBULATORY_CARE_PROVIDER_SITE_OTHER): Payer: Medicare Other | Admitting: Adult Health

## 2016-11-22 VITALS — BP 138/78 | HR 85 | Ht 67.0 in | Wt 157.0 lb

## 2016-11-22 DIAGNOSIS — E7800 Pure hypercholesterolemia, unspecified: Secondary | ICD-10-CM | POA: Diagnosis not present

## 2016-11-22 DIAGNOSIS — I428 Other cardiomyopathies: Secondary | ICD-10-CM

## 2016-11-22 DIAGNOSIS — I10 Essential (primary) hypertension: Secondary | ICD-10-CM

## 2016-11-22 MED ORDER — TRAMADOL HCL 50 MG PO TABS: 50.0000 mg | ORAL_TABLET | Freq: Four times a day (QID) | ORAL | 0 refills | Status: AC | PRN

## 2016-11-22 NOTE — Progress Notes (Signed)
Cardiology Office Note   Date:  11/22/2016   ID:  Cash Brierley, DOB [birth year], MRN [id number]  PCP:  [name], MD  Cardiologist: Lamar Sprinkles, NP   No chief complaint on file.     History of Present Illness: Phillip Mckay is a 76 y.o. 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 His admission initially was related to acute encephalopathy and respiratory failure. Other history includes hypertension. The patient was continued on Lasix twice a day for 3 days posthospitalization and then was to be decreased to 40 mg a day. Discharge weight 165 pounds.  Echocardiogram 11/05/2016 Left ventricle: The cavity size was normal. There was mild focal   basal hypertrophy of the septum. Systolic function was severely   reduced. The estimated ejection fraction was in the range of 20%   to 25%. Wall motion was normal; there were no regional wall   motion abnormalities. Doppler parameters are consistent with   abnormal left ventricular relaxation (grade 1 diastolic   dysfunction). Doppler parameters are consistent with elevated   ventricular end-diastolic filling pressure. - Aortic valve: Trileaflet; normal thickness leaflets. There was no   regurgitation. - Aortic root: The aortic root was normal in size. - Mitral valve: There was mild regurgitation. - Left atrium: The atrium was normal in size. - Right ventricle: Systolic function was normal. - Tricuspid valve: There was mild regurgitation. - Pulmonic valve: There was no regurgitation. - Pulmonary arteries: Systolic pressure was within the normal   range. - Inferior vena cava: The vessel was normal in size. - Pericardium, extracardiac: There was no pericardial effusion.  He comes today in a wheelchair, very frail, surrounded by his family. His main complaint is  of myalgia pain in his lower extremities. His family also has questions about the need to continue chemotherapy. Also concerning his heart function. The patient is due to return home from skilled nursing facility in 2 days. He underwent rehabilitation there and is now using a walker. There is been no issues with medical compliance as he is being provided medications at the rehabilitation facility. His family is very attentive and plans on continuing to be vigilant concerning his fluid intake, salt intake, and medical management.  Past Medical History:  Diagnosis Date  . Brain cancer ([place])   . Diabetes mellitus without complication ([place])   . Glioblastoma multiforme of brain ([place]) 10/27/2015    Past Surgical History:  Procedure Laterality Date  . CHOLECYSTECTOMY       Current Outpatient Prescriptions  Medication Sig Dispense Refill  . amoxicillin-clavulanate (AUGMENTIN) 875-125 MG tablet Take 1 tablet by mouth 2 (two) times daily. X 7days    . carvedilol (COREG) 6.25 MG tablet Take 1 tablet (6.25 mg total) by mouth 2 (two) times daily with a meal.    . cholecalciferol 2000 units tablet Take 1 tablet (2,000 Units total) by mouth daily. (Patient taking differently: Take 3,000 Units by mouth daily. ) 30 tablet 3  . clotrimazole-betamethasone (LOTRISONE) cream Apply 1 application topically 2 (two) times daily. 30 g 0  . dexamethasone (DECADRON) 4 MG tablet Take 4 mg by mouth daily.    Mariane Baumgarten Sodium 100 MG capsule Take 100 mg by mouth 2 (two) times daily.     . furosemide (LASIX) 40 MG tablet Take 1 tablet (40 mg total) by mouth 2 (  two) times daily. 30 tablet   . gabapentin (NEURONTIN) 300 MG capsule Take 300 mg by mouth 3 (three) times daily. Reported on 04/20/2016    . insulin aspart (NOVOLOG) 100 UNIT/ML injection Inject 0-20 Units into the skin 3 (three) times daily with meals. Sliding scale insulin correction factor:CBG < 70: Drink juice; CBG 70-120: 0 units ; CBG 121-150: 3 units; CBG  151-200: 4 units; CBG 201-250: 7 units; CBG 251-300: 11 units; CBG 301-350: 15 units ; CBG 351-400: 20 units ; CBG > 400: please notify MD 10 mL 11  . latanoprost (XALATAN) 0.005 % ophthalmic solution Place 1 drop into both eyes daily. Reported on 04/20/2016    . LORazepam (ATIVAN) 1 MG tablet Take 1 tablet (1 mg total) by mouth 2 (two) times daily as needed for anxiety or seizure. 20 tablet 0  . losartan (COZAAR) 25 MG tablet Take 1 tablet (25 mg total) by mouth daily.    . ondansetron (ZOFRAN) 4 MG tablet Take 1 tablet (4 mg total) by mouth every 6 (six) hours as needed for nausea. 20 tablet 0  . ondansetron (ZOFRAN) 8 MG tablet TAKE 1 TABLET BY MOUTH EVERY 8 HOURS AS NEEDED FOR NAUSEA AND VOMITING 30 tablet 2  . pantoprazole (PROTONIX) 40 MG tablet TAKE 1 TABLET (40 MG TOTAL) BY MOUTH DAILY. 30 tablet 1  . Polyethyl Glycol-Propyl Glycol 0.4-0.3 % SOLN Apply 1 drop to eye daily. For dry eye relief    . polyethylene glycol (MIRALAX / GLYCOLAX) packet Take 17 g by mouth 2 (two) times daily. 14 each 0  . potassium chloride SA (KLOR-CON M20) 20 MEQ tablet Take 1 tablet (20 mEq total) by mouth daily.    . Probiotic Product [facility]) CAPS Take 3 capsules by mouth daily.     . prochlorperazine (COMPAZINE) 10 MG tablet Take 1 tablet (10 mg total) by mouth every 6 (six) hours as needed for nausea or vomiting. Reported on 04/20/2016 30 tablet 2  . tamsulosin (FLOMAX) 0.4 MG CAPS capsule Take 0.4 mg by mouth daily.     Marland Kitchen tetrahydrozoline 0.05 % ophthalmic solution Place 1 drop into both eyes daily. Reported on 04/20/2016    . TRAVATAN Z 0.004 % SOLN ophthalmic solution Place 1 drop into both eyes at bedtime.     . traMADol (ULTRAM) 50 MG tablet Take 1 tablet (50 mg total) by mouth every 6 (six) hours as needed. 60 tablet 0   No current facility-administered medications for this visit.     Allergies:   Patient has no known allergies.    Social History:  The patient  reports that he has quit smoking. He has  never used smokeless tobacco. He reports that he does not drink alcohol or use drugs.   Family History:  The patient's family history includes Cancer in his brother, father, and mother; Diabetes in his mother.    ROS: All other systems are reviewed and negative. Unless otherwise mentioned in H&P    PHYSICAL EXAM: VS:  BP 138/78   Pulse 85   Ht 5\' 7"  (1.702 m)   Wt 157 lb (71.2 kg)   SpO2 97%   BMI 24.59 kg/m  , BMI Body mass index is 24.59 kg/m. GEN: Well nourished, well developed, in no acute distress  HEENT: normal  Neck: no JVD, carotid bruits, or masses Cardiac: RRR; no murmurs, rubs, or gallops,no edema  Respiratory:  clear to auscultation bilaterally, normal work of breathing GI: soft, nontender, nondistended, + BS  MS: no deformity or atrophy  Skin: warm and dry, no rash Neuro:  Strength and sensation are intact Psych: euthymic mood, full affect   Recent Labs: 11/02/2016: ALT 27 11/03/2016: TSH 0.814 11/04/2016: B Natriuretic Peptide 2,245.3 11/08/2016: BUN 24; Creatinine, Ser 1.12; Hemoglobin 9.2; Platelets 143; Potassium 4.0; Sodium 141    Lipid Panel No results found for: CHOL, TRIG, HDL, CHOLHDL, VLDL, LDLCALC, LDLDIRECT    Wt Readings from Last 3 Encounters:  11/22/16 157 lb (71.2 kg)  11/02/16 165 lb 9.6 oz (75.1 kg)  10/18/16 161 lb 4.8 oz (73.2 kg)      Other studies Reviewed:  ASSESSMENT AND PLAN:  1.  Systolic dysfunction: Not felt to be ischemic, related to chemotherapy toxicity. The patient has been taken off of daily now and is now undergoing a new form of treatment where he wears a with electrodes, called a "Quantum cap" which is being followed by oncology. The patient has no lower extremity edema or lung congestion. He continues to be frail but family states she is much stronger than he has been.  I have told the patient and family that we will not make recommendations concerning chemotherapy. This should be discussed with oncology. I will repeat  his echocardiogram in March to assess changes in LV function off of prior chemotherapy treatment. He will continue on carvedilol 6.25 mg twice a day, losartan. He is not on diuretics at this time. Weight has been stable. May need to reassess this once he goes home.  2. Hypercholesterolemia: The patient is complaining of myalgia pain on Zocor. We will stop this for now and evaluate his response to cessation. He is on tramadol 50 mg every 6 hours. He will take it at night time as this is when he notices most of his myalgia pain.  3. Hypertension: Blood pressure is slightly elevated today for his current ejection fraction. I will continue him on the losartan at 25 mg daily along with Lasix 40 mg twice a day. He has not taken his Lasix today as he had physician follow-up, and our appointment. We'll follow blood pressure closely and assess the need to adjust medications. He has had recent labs within the last 3 days all of which are within normal limits.  4. Glioblastoma: Being followed by oncology with treatment not related to chemotherapy. They will assess the need for any further treatment strategies. I have explained this to the patient and family who verbalized understanding.     Current medicines are reviewed at length with the patient today.    Labs/ tests ordered today include:   Orders Placed This Encounter  Procedures  . ECHOCARDIOGRAM COMPLETE     Disposition:   FU with 3 months. Signed, Jory Sims, NP  11/22/2016 3:37 PM    [facility] [address] [place], [place] [address] Phone: [phone]; Fax: [phone]

## 2016-11-22 NOTE — Progress Notes (Signed)
Name: Phillip Mckay    DOB: [birth year]  Age: 76 y.o.  MR#: [id number]       PCP:  [name], MD      Insurance: Payor: MEDICARE / Plan: MEDICARE PART A AND B / Product Type: *No Product type* /   CC:   No chief complaint on file.   VS Vitals:   11/22/16 1358  BP: 138/78  Pulse: 85  SpO2: 97%  Weight: 157 lb (71.2 kg)  Height: 5\' 7"  (1.702 m)    Weights Current Weight  11/22/16 157 lb (71.2 kg)  11/02/16 165 lb 9.6 oz (75.1 kg)  10/18/16 161 lb 4.8 oz (73.2 kg)    Blood Pressure  BP Readings from Last 3 Encounters:  11/22/16 138/78  11/08/16 117/66  10/24/16 139/78     Admit date:  (Not on file) Last encounter with RMR:  Visit date not found   Allergy Patient has no known allergies.  Current Outpatient Prescriptions  Medication Sig Dispense Refill  . amoxicillin-clavulanate (AUGMENTIN) 875-125 MG tablet Take 1 tablet by mouth 2 (two) times daily. X 7days    . carvedilol (COREG) 6.25 MG tablet Take 1 tablet (6.25 mg total) by mouth 2 (two) times daily with a meal.    . cholecalciferol 2000 units tablet Take 1 tablet (2,000 Units total) by mouth daily. (Patient taking differently: Take 3,000 Units by mouth daily. ) 30 tablet 3  . clotrimazole-betamethasone (LOTRISONE) cream Apply 1 application topically 2 (two) times daily. 30 g 0  . dexamethasone (DECADRON) 4 MG tablet Take 4 mg by mouth daily.    Mariane Baumgarten Sodium 100 MG capsule Take 100 mg by mouth 2 (two) times daily.     . furosemide (LASIX) 40 MG tablet Take 1 tablet (40 mg total) by mouth 2 (two) times daily. 30 tablet   . gabapentin (NEURONTIN) 300 MG capsule Take 300 mg by mouth 3 (three) times daily. Reported on 04/20/2016    . insulin aspart (NOVOLOG) 100 UNIT/ML injection Inject 0-20 Units into the skin 3 (three) times daily with meals. Sliding scale insulin correction factor:CBG < 70: Drink juice; CBG 70-120: 0 units ; CBG 121-150: 3 units; CBG 151-200: 4 units; CBG 201-250: 7 units; CBG 251-300: 11  units; CBG 301-350: 15 units ; CBG 351-400: 20 units ; CBG > 400: please notify MD 10 mL 11  . latanoprost (XALATAN) 0.005 % ophthalmic solution Place 1 drop into both eyes daily. Reported on 04/20/2016    . LORazepam (ATIVAN) 1 MG tablet Take 1 tablet (1 mg total) by mouth 2 (two) times daily as needed for anxiety or seizure. 20 tablet 0  . losartan (COZAAR) 25 MG tablet Take 1 tablet (25 mg total) by mouth daily.    . ondansetron (ZOFRAN) 4 MG tablet Take 1 tablet (4 mg total) by mouth every 6 (six) hours as needed for nausea. 20 tablet 0  . ondansetron (ZOFRAN) 8 MG tablet TAKE 1 TABLET BY MOUTH EVERY 8 HOURS AS NEEDED FOR NAUSEA AND VOMITING 30 tablet 2  . pantoprazole (PROTONIX) 40 MG tablet TAKE 1 TABLET (40 MG TOTAL) BY MOUTH DAILY. 30 tablet 1  . Polyethyl Glycol-Propyl Glycol 0.4-0.3 % SOLN Apply 1 drop to eye daily. For dry eye relief    . polyethylene glycol (MIRALAX / GLYCOLAX) packet Take 17 g by mouth 2 (two) times daily. 14 each 0  . potassium chloride SA (KLOR-CON M20) 20 MEQ tablet Take 1 tablet (20 mEq total)  by mouth daily.    . Probiotic Product [facility]) CAPS Take 3 capsules by mouth daily.     . prochlorperazine (COMPAZINE) 10 MG tablet Take 1 tablet (10 mg total) by mouth every 6 (six) hours as needed for nausea or vomiting. Reported on 04/20/2016 30 tablet 2  . simvastatin (ZOCOR) 20 MG tablet Take 20 mg by mouth every evening. Reported on 04/20/2016    . tamsulosin (FLOMAX) 0.4 MG CAPS capsule Take 0.4 mg by mouth daily.     Marland Kitchen tetrahydrozoline 0.05 % ophthalmic solution Place 1 drop into both eyes daily. Reported on 04/20/2016    . TRAVATAN Z 0.004 % SOLN ophthalmic solution Place 1 drop into both eyes at bedtime.      No current facility-administered medications for this visit.     Discontinued Meds:   There are no discontinued medications.  Patient Active Problem List   Diagnosis Date Noted  . Abnormal MRI   . Acute encephalopathy 11/05/2016  . Acute respiratory  failure with hypoxia ([place]) 11/05/2016  . Elevated troponin 11/05/2016  . PRES (posterior reversible encephalopathy syndrome)   . GBM (glioblastoma multiforme) ([place])   . Left hemiparesis ([place])   . Altered mental status 11/02/2016  . Low blood potassium 04/16/2016  . 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 Benign essential HTN 04/02/2016  . Thrombocytopenia ([place]) 03/19/2016  . Steroid withdrawal syndrome ([place]) 02/20/2016  . Hypoglycemia 02/13/2016  . 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 Candida infection of mouth 11/21/2015  . Avitaminosis D 10/29/2015  . Glioblastoma multiforme of brain ([place]) 10/27/2015  . Type 2 diabetes mellitus ([place]) 10/22/2015  . 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 Dyslipidemia 10/19/2015  . Acid reflux 10/19/2015  . Cerebral hemorrhage ([place]) 10/19/2015  . BP (high blood pressure) 10/19/2015  . Fungal infection of nail 09/14/2015  . Athlete's foot 09/14/2015  . Elevated fasting blood sugar 08/27/2015  . Carotid artery disease ([place]) 08/24/2015  . Diabetes mellitus ([place]) 08/24/2015  . Left ventricular hypertrophy 11/14/2010  . Essential (primary) hypertension 04/04/2009  . Temporary cerebral vascular dysfunction 04/04/2009  . Phlebitis of leg, superficial 03/24/2007  . Varicose veins of lower extremities with inflammation 03/24/2007  . Calculus of kidney 06/19/2006  . Generalized OA 03/08/2003  . Displacement of cervical intervertebral disc without myelopathy 02/02/2002  . Hypercholesterolemia 02/02/2002  . Herniation of intervertebral disc of cervical region 02/02/2002  . Benign neoplasm of cranial nerve ([place]) 07/03/1999    LABS    Component Value Date/Time   NA 141 11/08/2016 0411   NA 142 11/07/2016 0423   NA 145 11/06/2016 0312   K 4.0 11/08/2016 0411   K 3.2 (L) 11/07/2016 0423   K 3.4 (L) 11/06/2016 0312   CL 102 11/08/2016 0411   CL 109 11/07/2016 0423   CL 110 11/06/2016 0312   CO2 31  11/08/2016 0411   CO2 25 11/07/2016 0423   CO2 22 11/06/2016 0312   GLUCOSE 191 (H) 11/08/2016 0411   GLUCOSE 139 (H) 11/07/2016 0423   GLUCOSE 144 (H) 11/06/2016 0312   BUN 24 (H) 11/08/2016 0411   BUN 23 (H) 11/07/2016 0423   BUN 17 11/06/2016 0312   CREATININE 1.12 11/08/2016 0411   CREATININE 1.07 11/07/2016 0423   CREATININE 1.11 11/06/2016 0312   CALCIUM 8.7 (L) 11/08/2016 0411   CALCIUM 8.5 (L) 11/07/2016 0423   CALCIUM 8.7 (L) 11/06/2016 0312   GFRNONAA >60 11/08/2016 0411   GFRNONAA >60 11/07/2016  0423   GFRNONAA >60 11/06/2016 0312   GFRAA >60 11/08/2016 0411   GFRAA >60 11/07/2016 0423   GFRAA >60 11/06/2016 0312   CMP     Component Value Date/Time   NA 141 11/08/2016 0411   K 4.0 11/08/2016 0411   CL 102 11/08/2016 0411   CO2 31 11/08/2016 0411   GLUCOSE 191 (H) 11/08/2016 0411   BUN 24 (H) 11/08/2016 0411   CREATININE 1.12 11/08/2016 0411   CALCIUM 8.7 (L) 11/08/2016 0411   PROT 6.5 11/02/2016 1507   ALBUMIN 3.9 11/02/2016 1507   AST 25 11/02/2016 1507   ALT 27 11/02/2016 1507   ALKPHOS 36 (L) 11/02/2016 1507   BILITOT 1.1 11/02/2016 1507   GFRNONAA >60 11/08/2016 0411   GFRAA >60 11/08/2016 0411       Component Value Date/Time   WBC 10.5 11/08/2016 0411   WBC 9.6 11/07/2016 0653   WBC 12.2 (H) 11/06/2016 0312   HGB 9.2 (L) 11/08/2016 0411   HGB 9.3 (L) 11/07/2016 0653   HGB 10.3 (L) 11/06/2016 0312   HCT 27.4 (L) 11/08/2016 0411   HCT 27.0 (L) 11/07/2016 0653   HCT 30.2 (L) 11/06/2016 0312   MCV 98.6 11/08/2016 0411   MCV 97.8 11/07/2016 0653   MCV 98.7 11/06/2016 0312    Lipid Panel  No results found for: CHOL, TRIG, HDL, CHOLHDL, VLDL, LDLCALC, LDLDIRECT  ABG No results found for: PHART, PCO2ART, PO2ART, HCO3, TCO2, ACIDBASEDEF, O2SAT   Lab Results  Component Value Date   TSH 0.814 11/03/2016   BNP (last 3 results)  Recent Labs  11/04/16 1801  BNP 2,245.3*    ProBNP (last 3 results) No results for input(s): PROBNP in the  last 8760 hours.  Cardiac Panel (last 3 results) No results for input(s): CKTOTAL, CKMB, TROPONINI, RELINDX in the last 72 hours.  Iron/TIBC/Ferritin/ %Sat No results found for: IRON, TIBC, FERRITIN, IRONPCTSAT   EKG Orders placed or performed during the hospital encounter of 11/02/16  . ED EKG  . EKG 12-Lead  . EKG 12-Lead  . EKG     Prior Assessment and Plan Problem List as of 11/22/2016 Reviewed: 11/10/2016 10:15 PM by Molli Hazard, MD     Cardiovascular and Mediastinum   Carotid artery disease ([place])   Benign essential HTN   Essential (primary) hypertension   BP (high blood pressure)   Left ventricular hypertrophy   Phlebitis of leg, superficial   Temporary cerebral vascular dysfunction   Varicose veins of lower extremities with inflammation     Respiratory   Acute respiratory failure with hypoxia (HCC)     Digestive   Candida infection of mouth   Acid reflux     Endocrine   Diabetic osteomyelitis (HCC)   Diabetes mellitus (HCC)   Elevated fasting blood sugar   Type 2 diabetes mellitus ([place])   Hypoglycemia     Nervous and Auditory   Acoustic neuroma (HCC)   Benign neoplasm of cranial nerve (HCC)   Cerebral hemorrhage (HCC)   Glioblastoma multiforme of brain [place])   Last Assessment & Plan 10/18/2016 Office Visit Edited 10/18/2016 10:45 AM by Baird Cancer, PA-C    Right parietal lobe Glioblastoma Multiforme, grade 4, unknown methylation status of MGMT, S/P partial resection on 10/25/2015 by Dr. Owens Shark with removal of 80% of tumor followed by concomitant Temodar + XRT.  He started his first cycle of adjuvant temozolomide on 02/21/2016.  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  However, performance status and blood counts declined requiring a change back to 150  mg/m2 of Temodar on 08/02/2016.  Temodar discontinued on 08/27/2016 due to ongoing and significant weight loss and progression of disease on MRI imaging.  Avastin started on 09/20/2016.  Oncology history is updated.  Pre-treatment labs today: CBC diff, CMET, Urine dipstick.  I personally reviewed and went over laboratory results with the patient.  The results are noted within this dictation.  Elevated BP noted today.  He did take his antihypertensives as prescribed.  I have asked nursing to recheck BP prior to Avastin.  Recheck BP was consistently above baseline: 156/79.  He has not missed any doses of his antihypertensive medication.  He has not taken yet and usually takes it in the afternoon.  He will get Avastin today and start taking his BP medication in the AM.  He will return next week for BP check.  If still elevated, then I will increase his dose of Lisinopril.  Weight is up 2 lbs.  Burtis Junes, RD is following patient along.  Per Nathan's past conversations, I re-enforced the importance of good hydration, but given his current chemotherapy regimen, it would be reasonable to continue with H2O but also favor liquids with calories.    He is due for repeat brain imaging in Feb 2018.  MRI is ordered and is expected to be completed at the beginning of Feb 2018.  Return next week for BP check.  Return in 2 weeks for treatment and 4 weeks for treatment with follow-up.       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 Fall today. History of glioblastoma of brain. EXAM: CHEST 1 VIEW COMPARISON:  02/03/2016 FINDINGS: The heart size is within normal limits. There is stable ectasia of the thoracic aorta. Lung volumes are low bilaterally. There is no evidence of pulmonary edema, consolidation, pneumothorax, nodule or pleural fluid. The visualized skeletal structures are unremarkable. IMPRESSION: Low bilateral lung volumes.  No active disease. Electronically Signed   By: Aletta Edouard M.D.   On: 11/02/2016 15:48   Ct Head Wo Contrast  Result Date: 11/02/2016 CLINICAL DATA:  Altered mental status with slurred speech and dysphagia. Recent fall. Initial encounter. EXAM: CT HEAD WITHOUT CONTRAST TECHNIQUE: Contiguous axial images were obtained from the base of the skull through the vertex without intravenous contrast. COMPARISON:  Head CT and MRI 10/26/2016 FINDINGS: Brain: There is no evidence of acute infarct, acute intracranial hemorrhage, midline shift, or sizable extra-axial fluid collection. Postsurgical changes are again seen in the right parietal lobe with a  resection cavity and surrounding low-density in the white matter as well as some adjacent calcification, not significantly changed from the recent prior CT and more fully evaluated on the recent MRI. Postsurgical changes are also noted in the posterior fossa with mild encephalomalacia and gliosis in the left cerebellum. There is mild global cerebral atrophy. Confluent hypodensities in the cerebral white matter bilaterally are unchanged. Vascular: Minimal calcified atherosclerosis involving the carotid siphons. No hyperdense vessel. Skull: Prior right parietal craniotomy and left retromastoid craniectomy. Sinuses/Orbits: Trace  paranasal sinus mucosal thickening. Clear mastoid air cells. Unremarkable orbits. Other: None. IMPRESSION: 1. No evidence of acute intracranial abnormality. 2. Post treatment changes for glioblastoma, more fully evaluated on recent MRI. Electronically Signed   By: Logan Bores M.D.   On: 11/02/2016 17:07   Ct Angio Chest Pe W Or Wo Contrast  Result Date: 11/07/2016 CLINICAL DATA:  Hypoxia. EXAM: CT ANGIOGRAPHY CHEST WITH CONTRAST TECHNIQUE: Multidetector CT imaging of the chest was performed using the standard protocol during bolus administration of intravenous contrast. Multiplanar CT image reconstructions and MIPs were obtained to evaluate the vascular anatomy. CONTRAST:  100 mL of Isovue 370 intravenously. COMPARISON:  Radiograph of November 05, 2016. FINDINGS: Cardiovascular: Satisfactory opacification of the pulmonary arteries to the segmental level. No evidence of pulmonary embolism. Normal heart size. Minimal pericardial effusion. Coronary artery calcifications are noted. Mediastinum/Nodes: No enlarged mediastinal, hilar, or axillary lymph nodes. Thyroid gland, trachea, and esophagus demonstrate no significant findings. Lungs/Pleura: No pneumothorax is noted. Moderate bilateral pleural effusions are noted. Bilateral upper lobe airspace opacities are noted concerning for possible pneumonia. Left lower lobe airspace opacity is noted concerning for atelectasis or pneumonia. Upper Abdomen: No acute abnormality. Musculoskeletal: No chest wall abnormality. No acute or significant osseous findings. Review of the MIP images confirms the above findings. IMPRESSION: Coronary artery calcifications are noted suggesting coronary artery disease. Minimal pericardial effusion. No definite evidence of pulmonary embolus. Moderate bilateral pleural effusions. Bilateral upper lobe airspace opacity is noted concerning for possible pneumonia. Left lower lobe airspace opacity is noted concerning for atelectasis or pneumonia.  Electronically Signed   By: Marijo Conception, M.D.   On: 11/07/2016 15:21   Dg Chest Port 1 View  Result Date: 11/05/2016 CLINICAL DATA:  Chest pain radiating to the left. EXAM: PORTABLE CHEST 1 VIEW COMPARISON:  Single-view of the chest 11/04/2016 and 11/02/2016. FINDINGS: There has been marked progression of bilateral airspace disease over the past 3 days. Small bilateral pleural effusions are seen. Heart size is normal. No pneumothorax. IMPRESSION: Progressive bilateral airspace disease has an appearance most compatible with pulmonary edema. Electronically Signed   By: Inge Rise M.D.   On: 11/05/2016 09:15   Dg Chest Port 1 View  Result Date: 11/04/2016 CLINICAL DATA:  Tachycardia and shortness of breath beginning last night. Chest pain. EXAM: PORTABLE CHEST 1 VIEW COMPARISON:  11/02/2016. FINDINGS: Heart size is at the upper limits of normal. There is some tortuosity of the aorta. There is pulmonary venous hypertension with early pulmonary edema. No consolidation or collapse. No visible effusion. 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 Electronically Signed   By: Nelson Chimes M.D.   On: 11/04/2016 09:37

## 2016-11-22 NOTE — Patient Instructions (Signed)
Your physician recommends that you schedule a follow-up appointment in: 3 Months  Your physician has recommended you make the following change in your medication: Stop Taking Zocor Start Taking Tramadol 50 mg every 6 hours as needed for leg pain  Your physician has requested that you have an echocardiogram. Echocardiography is a painless test that uses sound waves to create images of your heart. It provides your doctor with information about the size and shape of your heart and how well your heart's chambers and valves are working. This procedure takes approximately one hour. There are no restrictions for this procedure.  If you need a refill on your cardiac medications before your next appointment, please call your pharmacy.  Thank you for choosing [facility]!

## 2016-11-29 ENCOUNTER — Ambulatory Visit (HOSPITAL_COMMUNITY): Payer: Medicare Other

## 2016-11-29 ENCOUNTER — Ambulatory Visit (HOSPITAL_COMMUNITY): Payer: Medicare Other | Admitting: Hematology & Oncology

## 2016-11-29 ENCOUNTER — Other Ambulatory Visit (HOSPITAL_COMMUNITY): Payer: Medicare Other

## 2017-01-07 ENCOUNTER — Ambulatory Visit (HOSPITAL_COMMUNITY)
Admission: RE | Admit: 2017-01-07 | Discharge: 2017-01-07 | Disposition: A | Discharge status: Home / Self Care | Payer: Medicare Other | Source: Ambulatory Visit | Attending: Adult Health | Admitting: Adult Health

## 2017-01-07 DIAGNOSIS — I119 Hypertensive heart disease without heart failure: Secondary | ICD-10-CM | POA: Diagnosis not present

## 2017-01-07 DIAGNOSIS — Z87891 Personal history of nicotine dependence: Secondary | ICD-10-CM | POA: Diagnosis not present

## 2017-01-07 DIAGNOSIS — I10 Essential (primary) hypertension: Secondary | ICD-10-CM | POA: Diagnosis not present

## 2017-01-07 DIAGNOSIS — E119 Type 2 diabetes mellitus without complications: Secondary | ICD-10-CM | POA: Insufficient documentation

## 2017-01-07 NOTE — Progress Notes (Signed)
*  PRELIMINARY RESULTS* Echocardiogram 2D Echocardiogram has been performed.  Leavy Cella 01/07/2017, 1:24 PM

## 2017-01-10 ENCOUNTER — Ambulatory Visit: Payer: Medicare Other | Admitting: Adult Health

## 2017-01-14 ENCOUNTER — Ambulatory Visit: Payer: Medicare Other | Admitting: Cardiovascular Disease

## 2017-03-11 IMAGING — CT CT HEAD W/O CM
3 series · 15 of 47 positions shown, 18 images · non-contrast
Comparison: Head CT and MRI 10/26/2016

CLINICAL DATA: Altered mental status with slurred speech and
dysphagia. Recent fall. Initial encounter.

EXAM:
CT HEAD WITHOUT CONTRAST
TECHNIQUE: Contiguous axial images were obtained from the base of the skull
through the vertex without intravenous contrast.

[Series 2: head trauma wo · axial · 0.47mm/px · z∈[+164,+299]mm · 9 of 33 slices shown, 12 images]
[im 3/33  brain]
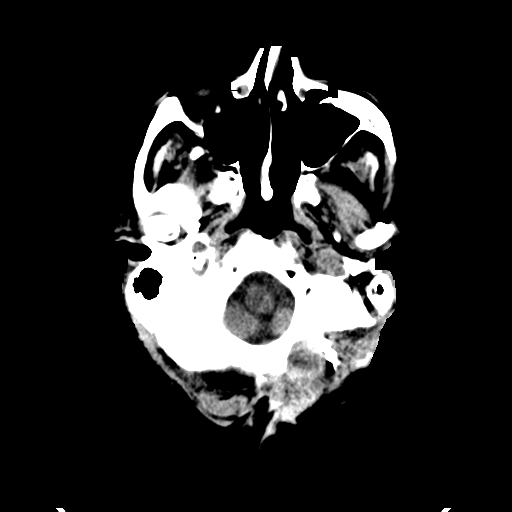
[im 3/33  bone]
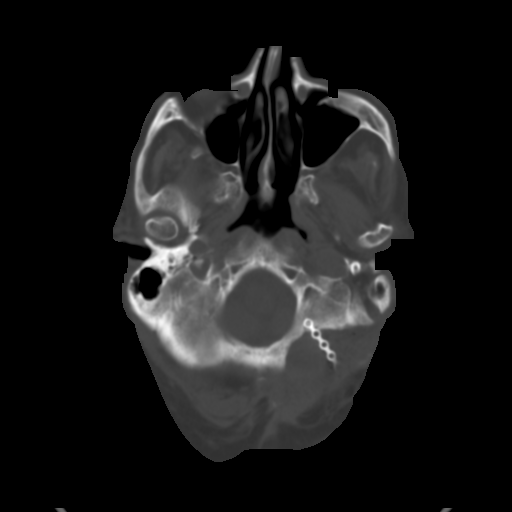
[im 6/33  brain]
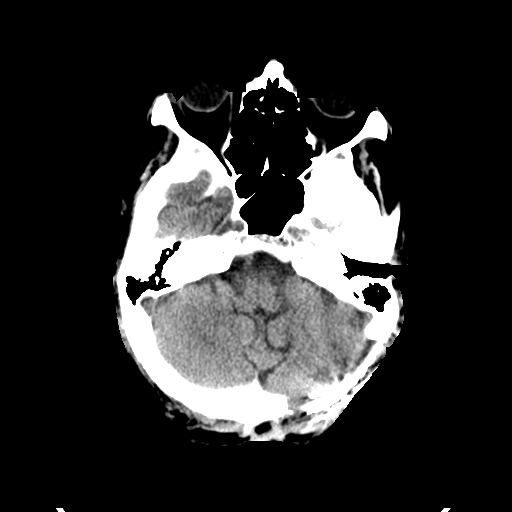
[im 9/33  brain]
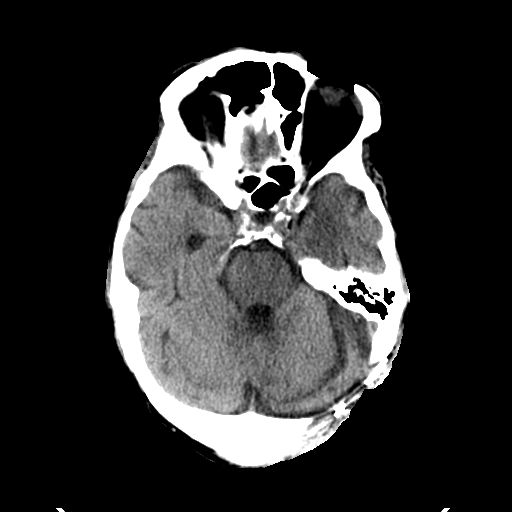
[im 13/33  brain]
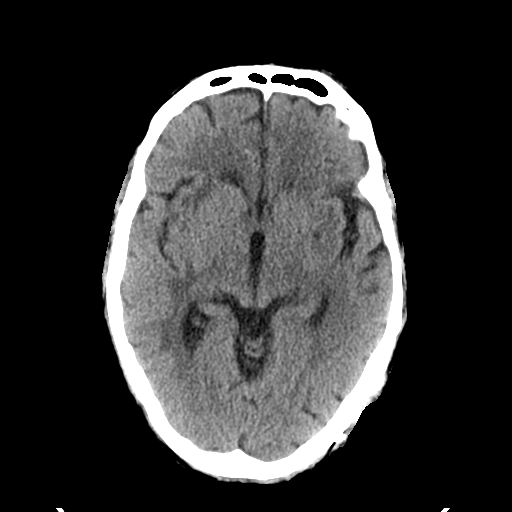
[im 17/33  brain]
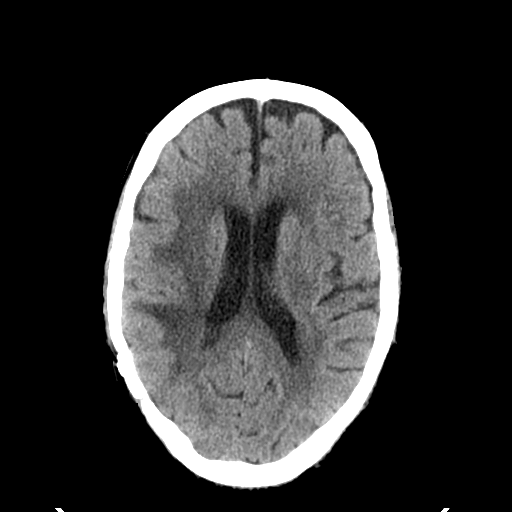
[im 17/33  bone]
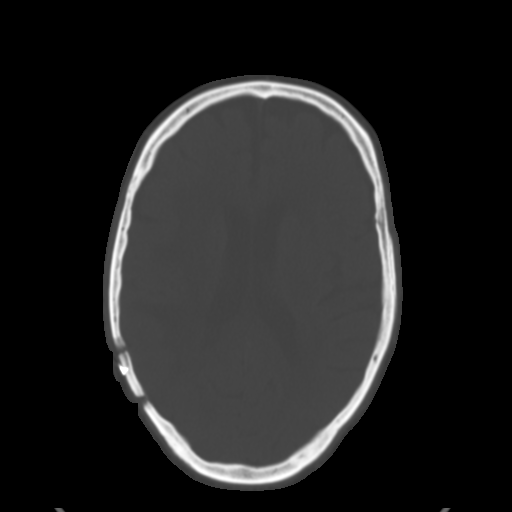
[im 20/33  brain]
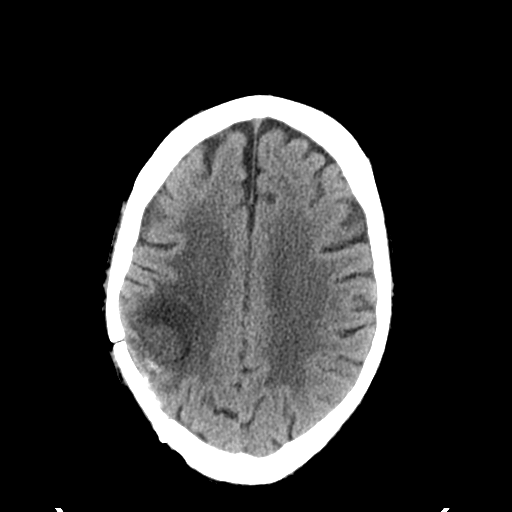
[im 24/33  brain]
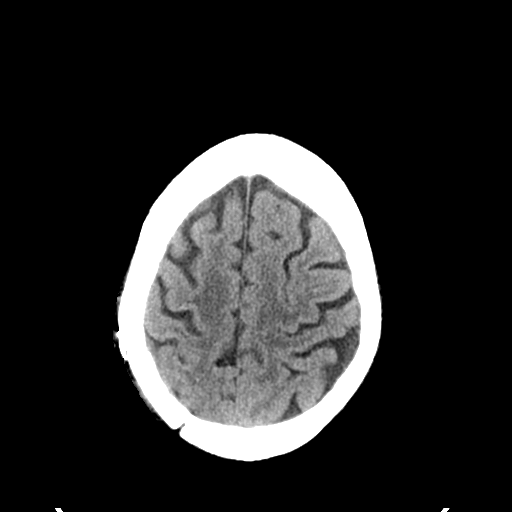
[im 27/33  brain]
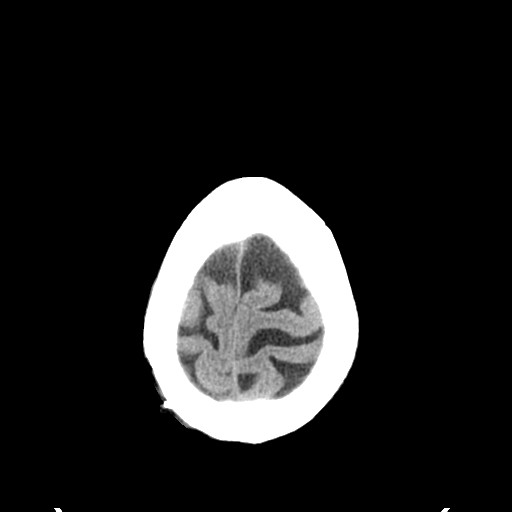
[im 30/33  brain]
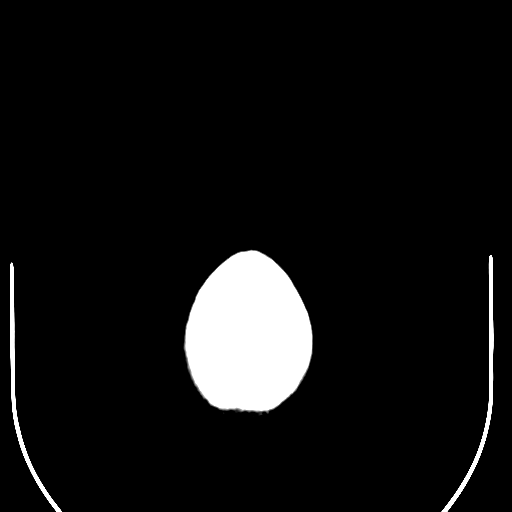
[im 30/33  bone]
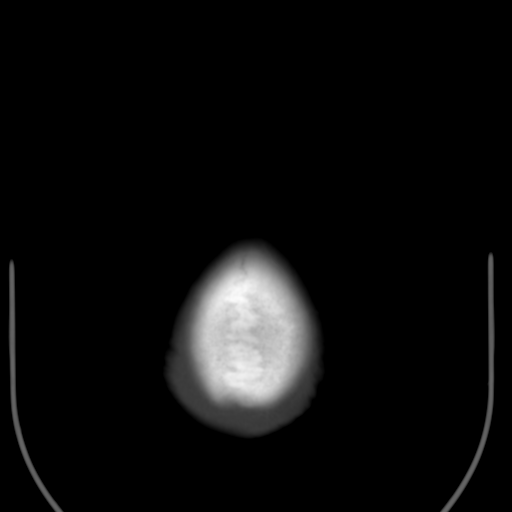

[Series 4: coronal soft tissue · coronal · 0.32mm/px · 3 of 81 slices shown]
[im 27/81  brain]
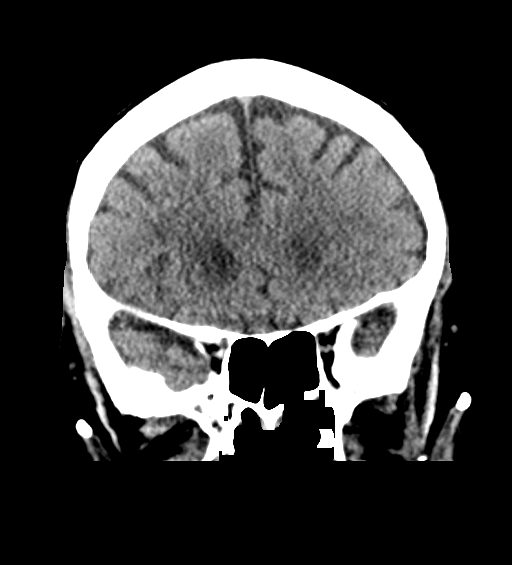
[im 36/81  brain]
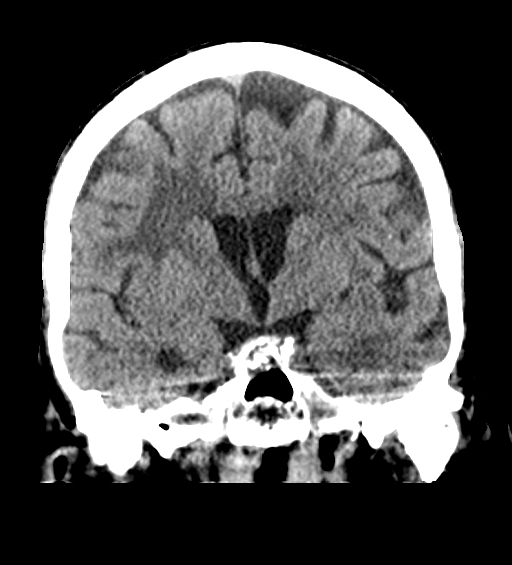
[im 45/81  brain]
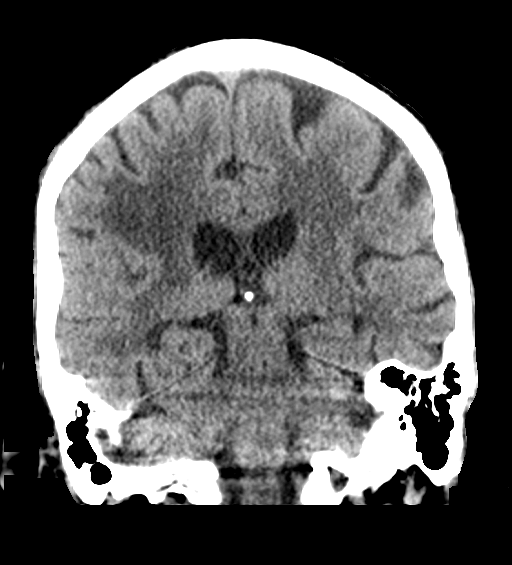

[Series 5: sagittal soft tissue · sagittal · 0.37mm/px · 3 of 64 slices shown]
[im 22/64  brain]
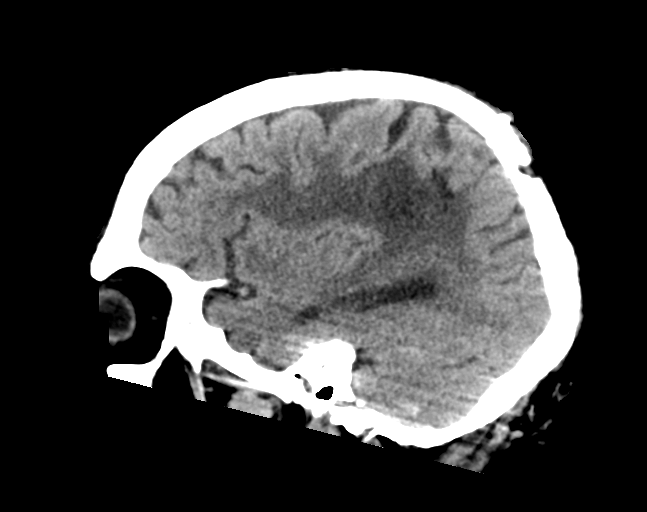
[im 32/64  brain]
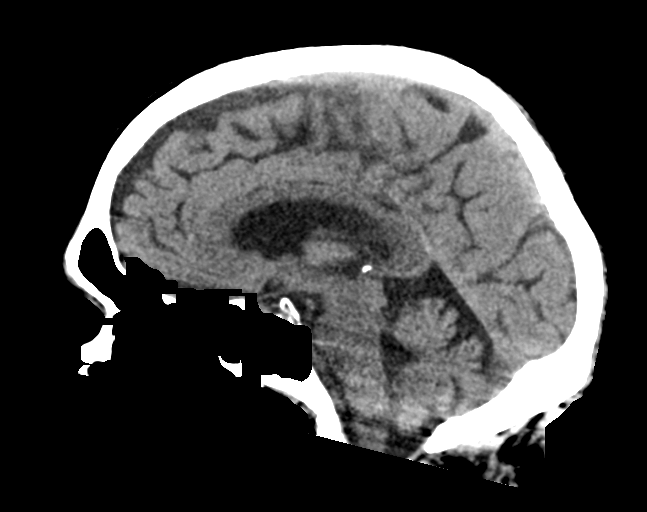
[im 43/64  brain]
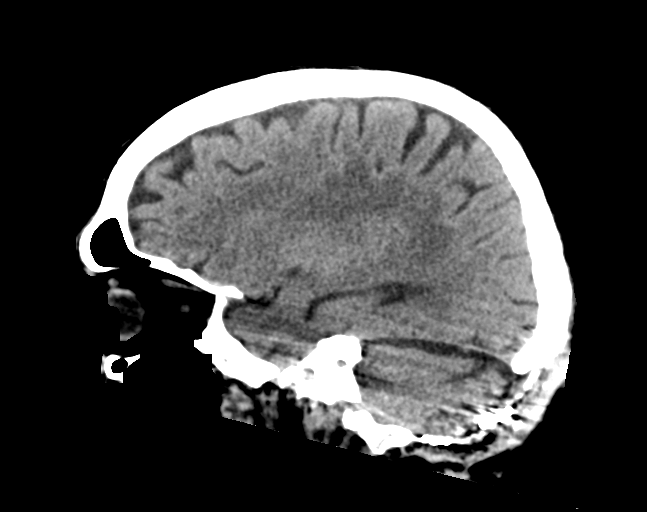

[15 of 47 positions shown; findings below may reference images not displayed]

FINDINGS: Brain: There is no evidence of acute infarct, acute intracranial
hemorrhage, midline shift, or sizable extra-axial fluid collection.
Postsurgical changes are again seen in the right parietal lobe with
a resection cavity and surrounding low-density in the white matter
as well as some adjacent calcification, not significantly changed
from the recent prior CT and more fully evaluated on the recent MRI.
Postsurgical changes are also noted in the posterior fossa with mild
encephalomalacia and gliosis in the left cerebellum. There is mild
global cerebral atrophy. Confluent hypodensities in the cerebral
white matter bilaterally are unchanged.

Vascular: Minimal calcified atherosclerosis involving the carotid
siphons. No hyperdense vessel.

Skull: Prior right parietal craniotomy and left retromastoid
craniectomy.

Sinuses/Orbits: Trace paranasal sinus mucosal thickening. Clear
mastoid air cells. Unremarkable orbits.

Other: None.
IMPRESSION: 1. No evidence of acute intracranial abnormality.
2. Post treatment changes for glioblastoma, more fully evaluated on
recent MRI.

## 2017-04-14 DEATH — deceased

## 2024-08-07 ENCOUNTER — Telehealth (HOSPITAL_BASED_OUTPATIENT_CLINIC_OR_DEPARTMENT_OTHER): Payer: Self-pay

## 2024-08-07 NOTE — Telephone Encounter (Signed)
 [facility] is okay.

## 2024-08-07 NOTE — Telephone Encounter (Signed)
 This is a new patient as it has been > 5 years since seen by cardiology practice.  KL

## 2024-08-07 NOTE — Telephone Encounter (Signed)
 Left message for the pt to call back as he is going to need a new pt appt with the HF1st provider  per Lamarr CROME. DNP.

## 2024-08-07 NOTE — Telephone Encounter (Signed)
 Will ask the preop APP to review if MD new pt or HF1st new pt. Pt last seen by cardiology Lamarr Satterfield, DNP [phone]

## 2024-08-07 NOTE — Telephone Encounter (Signed)
   Name: Phillip Mckay  DOB: [birth year]  MRN: [id number]  Primary Cardiologist: None  Chart reviewed as part of pre-operative protocol coverage. Because of Phillip Mckay's past medical history and time since last visit, he will require a follow-up in-office visit in order to better assess preoperative cardiovascular risk.  Pre-op covering staff: - Please schedule appointment and call patient to inform them. If patient already had an upcoming appointment within acceptable timeframe, please add pre-op clearance to the appointment notes so provider is aware. - Please contact requesting surgeon's office via preferred method (i.e, phone, fax) to inform them of need for appointment prior to surgery.    Lamarr Satterfield, NP  08/07/2024, 1:08 PM

## 2024-08-07 NOTE — Telephone Encounter (Signed)
 Clarification please, does pt need MD or HF1st new pt

## 2024-08-07 NOTE — Telephone Encounter (Signed)
   Pre-operative Risk Assessment    Patient Name: Phillip Mckay  DOB: 06/25/1941 MRN: [id number]   Date of last office visit: 11/22/2016 with Lamarr Satterfield  Date of next office visit: NA   Request for Surgical Clearance    Procedure:  Colonoscopy  Date of Surgery:  Clearance 09/14/24                                 Surgeon:  Dr. Elicia Surgeon's Group or Practice Name:  [facility] Phone number:  [phone] Fax number:  [phone]   Type of Clearance Requested:   - Medical    Type of Anesthesia:  propofol   Additional requests/questions:    Bonney Augustin JONETTA Delores   08/07/2024, 12:01 PM

## 2024-08-10 NOTE — Telephone Encounter (Signed)
 2ND attempt to reach the pt to schedule NEW PT APPT ON THE [facility] SCHEDULE.

## 2024-08-11 NOTE — Telephone Encounter (Signed)
 3rd attempt to reach the pt to schedule a new pt appt to re-est as pt last seen 2018. Will update the requesting office pt needs to call [phone] and schedule new pt appt. I will remove from the preop call back pool at this time.    Per Phillip Mckay new pt appt can be scheduled on [facility] provider schedule.
